# Patient Record
Sex: Female | Born: 1990 | Race: White | Hispanic: No | Marital: Married | Smoking: Never smoker
Health system: Southern US, Community
[De-identification: ages and names within clinical notes are randomized; demographics above are authoritative.]

## PROBLEM LIST (undated history)

## (undated) ENCOUNTER — Inpatient Hospital Stay (HOSPITAL_COMMUNITY): Payer: Self-pay

## (undated) DIAGNOSIS — R87619 Unspecified abnormal cytological findings in specimens from cervix uteri: Secondary | ICD-10-CM

## (undated) DIAGNOSIS — R87629 Unspecified abnormal cytological findings in specimens from vagina: Secondary | ICD-10-CM

## (undated) DIAGNOSIS — Z9889 Other specified postprocedural states: Secondary | ICD-10-CM

## (undated) DIAGNOSIS — R112 Nausea with vomiting, unspecified: Secondary | ICD-10-CM

## (undated) DIAGNOSIS — IMO0002 Reserved for concepts with insufficient information to code with codable children: Secondary | ICD-10-CM

## (undated) DIAGNOSIS — T7840XA Allergy, unspecified, initial encounter: Secondary | ICD-10-CM

## (undated) DIAGNOSIS — R51 Headache: Secondary | ICD-10-CM

## (undated) DIAGNOSIS — F419 Anxiety disorder, unspecified: Secondary | ICD-10-CM

## (undated) HISTORY — DX: Nausea with vomiting, unspecified: R11.2

## (undated) HISTORY — PX: COLPOSCOPY: SHX161

## (undated) HISTORY — DX: Unspecified abnormal cytological findings in specimens from vagina: R87.629

## (undated) HISTORY — DX: Anxiety disorder, unspecified: F41.9

## (undated) HISTORY — DX: Allergy, unspecified, initial encounter: T78.40XA

## (undated) HISTORY — PX: TONSILLECTOMY: SUR1361

## (undated) HISTORY — PX: WISDOM TOOTH EXTRACTION: SHX21

## (undated) HISTORY — DX: Other specified postprocedural states: Z98.890

---
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
## 2008-10-27 ENCOUNTER — Emergency Department (HOSPITAL_COMMUNITY): Admission: EM | Admit: 2008-10-27 | Discharge: 2008-10-27 | Payer: Self-pay | Admitting: Emergency Medicine

## 2009-09-12 ENCOUNTER — Ambulatory Visit: Payer: Self-pay | Admitting: Gynecology

## 2009-09-12 ENCOUNTER — Inpatient Hospital Stay (HOSPITAL_COMMUNITY): Admission: AD | Admit: 2009-09-12 | Discharge: 2009-09-12 | Payer: Self-pay | Admitting: Obstetrics and Gynecology

## 2010-04-17 ENCOUNTER — Encounter: Payer: Self-pay | Admitting: Rheumatology

## 2010-06-18 LAB — CBC
HCT: 37.1 % (ref 36.0–46.0)
Hemoglobin: 12.8 g/dL (ref 12.0–15.0)
MCHC: 34.5 g/dL (ref 30.0–36.0)
MCV: 91.7 fL (ref 78.0–100.0)
Platelets: 239 10*3/uL (ref 150–400)
RBC: 4.05 MIL/uL (ref 3.87–5.11)
RDW: 13.2 % (ref 11.5–15.5)
WBC: 11.6 10*3/uL — ABNORMAL HIGH (ref 4.0–10.5)

## 2010-06-18 LAB — ABO/RH: ABO/RH(D): O POS

## 2010-06-18 LAB — HCG, QUANTITATIVE, PREGNANCY: hCG, Beta Chain, Quant, S: 17833 m[IU]/mL — ABNORMAL HIGH (ref <5)

## 2010-07-08 LAB — URINALYSIS, ROUTINE W REFLEX MICROSCOPIC
Bilirubin Urine: NEGATIVE
Glucose, UA: NEGATIVE mg/dL
Hgb urine dipstick: NEGATIVE
Ketones, ur: NEGATIVE mg/dL
Nitrite: NEGATIVE
Protein, ur: NEGATIVE mg/dL
Specific Gravity, Urine: 1.023 (ref 1.005–1.030)
Urobilinogen, UA: 1 mg/dL (ref 0.0–1.0)
pH: 6 (ref 5.0–8.0)

## 2010-07-08 LAB — DIFFERENTIAL
Basophils Absolute: 0 10*3/uL (ref 0.0–0.1)
Basophils Relative: 1 % (ref 0–1)
Eosinophils Absolute: 0.1 10*3/uL (ref 0.0–0.7)
Eosinophils Relative: 2 % (ref 0–5)
Lymphocytes Relative: 32 % (ref 12–46)
Lymphs Abs: 2.9 10*3/uL (ref 0.7–4.0)
Monocytes Absolute: 1 10*3/uL (ref 0.1–1.0)
Monocytes Relative: 11 % (ref 3–12)
Neutro Abs: 4.9 10*3/uL (ref 1.7–7.7)
Neutrophils Relative %: 55 % (ref 43–77)

## 2010-07-08 LAB — CBC
HCT: 37.4 % (ref 36.0–46.0)
Hemoglobin: 12.8 g/dL (ref 12.0–15.0)
MCHC: 34.2 g/dL (ref 30.0–36.0)
MCV: 89.4 fL (ref 78.0–100.0)
Platelets: 244 10*3/uL (ref 150–400)
RBC: 4.18 MIL/uL (ref 3.87–5.11)
RDW: 12.4 % (ref 11.5–15.5)
WBC: 9 10*3/uL (ref 4.0–10.5)

## 2010-07-08 LAB — POCT PREGNANCY, URINE: Preg Test, Ur: NEGATIVE

## 2010-07-08 LAB — D-DIMER, QUANTITATIVE: D-Dimer, Quant: 0.29 ug/mL-FEU (ref 0.00–0.48)

## 2010-10-04 ENCOUNTER — Emergency Department (HOSPITAL_COMMUNITY)
Admission: EM | Admit: 2010-10-04 | Discharge: 2010-10-05 | Disposition: A | Discharge status: Home / Self Care | Payer: 59 | Attending: Emergency Medicine | Admitting: Emergency Medicine

## 2010-10-04 DIAGNOSIS — R51 Headache: Secondary | ICD-10-CM | POA: Insufficient documentation

## 2010-10-04 DIAGNOSIS — B9789 Other viral agents as the cause of diseases classified elsewhere: Secondary | ICD-10-CM | POA: Insufficient documentation

## 2010-10-04 DIAGNOSIS — R509 Fever, unspecified: Secondary | ICD-10-CM | POA: Insufficient documentation

## 2010-10-05 ENCOUNTER — Emergency Department (HOSPITAL_COMMUNITY): Payer: 59

## 2010-10-05 ENCOUNTER — Encounter (HOSPITAL_COMMUNITY): Payer: Self-pay

## 2010-10-05 LAB — URINALYSIS, ROUTINE W REFLEX MICROSCOPIC
Glucose, UA: NEGATIVE mg/dL
Ketones, ur: 40 mg/dL — AB
Leukocytes, UA: NEGATIVE
Nitrite: NEGATIVE
Protein, ur: 30 mg/dL — AB
Specific Gravity, Urine: 1.028 (ref 1.005–1.030)
Urobilinogen, UA: 1 mg/dL (ref 0.0–1.0)
pH: 6 (ref 5.0–8.0)

## 2010-10-05 LAB — COMPREHENSIVE METABOLIC PANEL
ALT: 10 U/L (ref 0–35)
AST: 13 U/L (ref 0–37)
Albumin: 3.8 g/dL (ref 3.5–5.2)
Alkaline Phosphatase: 99 U/L (ref 39–117)
BUN: 9 mg/dL (ref 6–23)
CO2: 24 mEq/L (ref 19–32)
Calcium: 8.5 mg/dL (ref 8.4–10.5)
Chloride: 102 mEq/L (ref 96–112)
Creatinine, Ser: 0.49 mg/dL — ABNORMAL LOW (ref 0.50–1.10)
GFR calc Af Amer: >60 mL/min (ref >60)
GFR calc non Af Amer: >60 mL/min (ref >60)
Glucose, Bld: 99 mg/dL (ref 70–99)
Potassium: 3.1 mEq/L — ABNORMAL LOW (ref 3.5–5.1)
Sodium: 137 mEq/L (ref 135–145)
Total Bilirubin: 0.4 mg/dL (ref 0.3–1.2)
Total Protein: 8 g/dL (ref 6.0–8.3)

## 2010-10-05 LAB — URINE MICROSCOPIC-ADD ON

## 2010-10-05 LAB — POCT PREGNANCY, URINE: Preg Test, Ur: NEGATIVE

## 2010-10-05 LAB — CBC
HCT: 34.8 % — ABNORMAL LOW (ref 36.0–46.0)
Hemoglobin: 11.9 g/dL — ABNORMAL LOW (ref 12.0–15.0)
MCH: 30.5 pg (ref 26.0–34.0)
MCHC: 34.2 g/dL (ref 30.0–36.0)
MCV: 89.2 fL (ref 78.0–100.0)
Platelets: 207 10*3/uL (ref 150–400)
RBC: 3.9 MIL/uL (ref 3.87–5.11)
RDW: 12.7 % (ref 11.5–15.5)
WBC: 19.3 10*3/uL — ABNORMAL HIGH (ref 4.0–10.5)

## 2010-10-05 LAB — DIFFERENTIAL
Basophils Absolute: 0 10*3/uL (ref 0.0–0.1)
Basophils Relative: 0 % (ref 0–1)
Eosinophils Absolute: 0 10*3/uL (ref 0.0–0.7)
Eosinophils Relative: 0 % (ref 0–5)
Lymphocytes Relative: 14 % (ref 12–46)
Lymphs Abs: 2.6 10*3/uL (ref 0.7–4.0)
Monocytes Absolute: 2.4 10*3/uL — ABNORMAL HIGH (ref 0.1–1.0)
Monocytes Relative: 13 % — ABNORMAL HIGH (ref 3–12)
Neutro Abs: 14.2 10*3/uL — ABNORMAL HIGH (ref 1.7–7.7)
Neutrophils Relative %: 74 % (ref 43–77)

## 2010-10-05 LAB — MONONUCLEOSIS SCREEN: Mono Screen: NEGATIVE

## 2010-10-06 LAB — URINE CULTURE
Colony Count: NO GROWTH
Culture  Setup Time: 201207061150
Culture: NO GROWTH

## 2010-12-31 LAB — OB RESULTS CONSOLE ABO/RH: RH Type: POSITIVE

## 2010-12-31 LAB — OB RESULTS CONSOLE GC/CHLAMYDIA
Chlamydia: NEGATIVE
Gonorrhea: NEGATIVE

## 2010-12-31 LAB — OB RESULTS CONSOLE HEPATITIS B SURFACE ANTIGEN: Hepatitis B Surface Ag: NEGATIVE

## 2010-12-31 LAB — OB RESULTS CONSOLE RPR: RPR: NONREACTIVE

## 2010-12-31 LAB — OB RESULTS CONSOLE ANTIBODY SCREEN: Antibody Screen: NEGATIVE

## 2010-12-31 LAB — OB RESULTS CONSOLE RUBELLA ANTIBODY, IGM: Rubella: IMMUNE

## 2010-12-31 LAB — OB RESULTS CONSOLE HIV ANTIBODY (ROUTINE TESTING): HIV: NONREACTIVE

## 2011-01-22 DIAGNOSIS — M79609 Pain in unspecified limb: Secondary | ICD-10-CM

## 2011-02-10 ENCOUNTER — Encounter (HOSPITAL_COMMUNITY): Payer: Self-pay | Admitting: *Deleted

## 2011-02-10 ENCOUNTER — Inpatient Hospital Stay (HOSPITAL_COMMUNITY)
Admission: RE | Admit: 2011-02-10 | Discharge: 2011-02-10 | Disposition: A | Discharge status: Home / Self Care | Payer: 59 | Source: Ambulatory Visit | Attending: Obstetrics and Gynecology | Admitting: Obstetrics and Gynecology

## 2011-02-10 DIAGNOSIS — M79606 Pain in leg, unspecified: Secondary | ICD-10-CM

## 2011-02-10 DIAGNOSIS — M79609 Pain in unspecified limb: Secondary | ICD-10-CM | POA: Insufficient documentation

## 2011-02-10 DIAGNOSIS — O99891 Other specified diseases and conditions complicating pregnancy: Secondary | ICD-10-CM | POA: Insufficient documentation

## 2011-02-10 HISTORY — DX: Reserved for concepts with insufficient information to code with codable children: IMO0002

## 2011-02-10 HISTORY — DX: Unspecified abnormal cytological findings in specimens from cervix uteri: R87.619

## 2011-02-10 NOTE — ED Provider Notes (Signed)
History   Pt presents today c/o pain in her Rt leg that began earlier today. She states she had the same type pain in her Lt leg last week but now it has moved to the Rt leg. She states the pain starts above her Rt knee and radiates down into her foot. She also reports some "soreness" in her calf. She denies edema, erythema. She denies abd pain, vag dc, bleeding.  Chief Complaint  Patient presents with  . Leg Pain   HPI  OB History    Grav Para Term Preterm Abortions TAB SAB Ect Mult Living   1               History reviewed. No pertinent past medical history.  History reviewed. No pertinent past surgical history.  No family history on file.  History  Substance Use Topics  . Smoking status: Not on file  . Smokeless tobacco: Not on file  . Alcohol Use: Not on file    Allergies: No Known Allergies  Prescriptions prior to admission  Medication Sig Dispense Refill  . albuterol (PROVENTIL,VENTOLIN) 90 MCG/ACT inhaler Inhale 2 puffs into the lungs every 6 (six) hours as needed.        . prenatal vitamin w/FE, FA (PRENATAL 1 + 1) 27-1 MG TABS Take 1 tablet by mouth daily.        . progesterone 200 MG SUPP Place 200 mg vaginally at bedtime.          Review of Systems  Constitutional: Negative for fever.  Cardiovascular: Negative for chest pain.  Gastrointestinal: Negative for nausea, vomiting, abdominal pain, diarrhea and constipation.  Genitourinary: Negative for dysuria, urgency, frequency and hematuria.  Musculoskeletal: Positive for myalgias.  Neurological: Negative for dizziness and headaches.  Psychiatric/Behavioral: Negative for depression and suicidal ideas.   Physical Exam   Blood pressure 110/70, pulse 88, temperature 98.3 F (36.8 C), temperature source Oral, resp. rate 20, height 5\' 7"  (1.702 m), weight 165 lb (74.844 kg), last menstrual period 10/02/2010.  Physical Exam  Nursing note and vitals reviewed. Constitutional: She is oriented to person, place, and  time. She appears well-developed and well-nourished. No distress.  HENT:  Head: Atraumatic.  Eyes: EOM are normal. Pupils are equal, round, and reactive to light.  GI: Soft. She exhibits no distension. There is no tenderness. There is no rebound and no guarding.  Musculoskeletal:       Right knee: She exhibits normal range of motion, no swelling, no ecchymosis, no deformity and no erythema.       Rt leg and lower extremity appears NL. No edema, erythema, or any other abnormality noted. Pt with NL distal pedal pulses. NL popliteal pulses.  Neurological: She is alert and oriented to person, place, and time.  Skin: Skin is warm and dry. She is not diaphoretic.  Psychiatric: She has a normal mood and affect. Her behavior is normal. Judgment and thought content normal.    MAU Course  Procedures    Assessment and Plan  Rt leg/knee pain: discussed with pt at length. Doubt DVT as pt is having pain in anterior portion of lower extremity and there is no erythema or edema. Pt also had exact same pain in other lower extremity last week. However, advised pt to go to [facility] or [facility] ED for further evaluation. She understood and agreed.  Clinton Gallant. Rice III, DrHSc, MPAS, PA-C  02/10/2011, 1:15 AM   Henrietta Hoover, PA 02/10/11 1610

## 2011-02-10 NOTE — Progress Notes (Signed)
Pt states, " My right  knee started hurting at about 6 pm, and the pain radiates all the way to my toes. It hurts to even bend my toes and it feels numb and tingly. My calves hurt. On  Sunday of last week my left leg did the same thing and had achy and sharp pain from 6 pm to midnight. I haven't any injury or fall."

## 2011-02-10 NOTE — ED Notes (Signed)
Henrietta Hoover PA at bedside.

## 2011-05-05 ENCOUNTER — Inpatient Hospital Stay (HOSPITAL_COMMUNITY)
Admission: AD | Admit: 2011-05-05 | Discharge: 2011-05-05 | Disposition: A | Discharge status: Home / Self Care | Payer: 59 | Source: Ambulatory Visit | Attending: Obstetrics and Gynecology | Admitting: Obstetrics and Gynecology

## 2011-05-05 ENCOUNTER — Encounter (HOSPITAL_COMMUNITY): Payer: Self-pay | Admitting: *Deleted

## 2011-05-05 DIAGNOSIS — O368190 Decreased fetal movements, unspecified trimester, not applicable or unspecified: Secondary | ICD-10-CM | POA: Insufficient documentation

## 2011-05-05 NOTE — ED Provider Notes (Signed)
History     Chief Complaint  Patient presents with  . Decreased Fetal Movement   HPI  Kristen Davidson is 21 y.o. G1P0 [redacted]w[redacted]d weeks presenting with decreased fetal movement.  States she has only felt the baby move X 1 today.  States her glucose testing is tomorrow and was afraid to eat much.  Patient is stable.  Happy to hear fetal heart tones.  Denies vaginal bleeding or leaking of fluid.   OB History    Grav Para Term Preterm Abortions TAB SAB Ect Mult Living   1               Past Medical History  Diagnosis Date  . Asthma   . Abnormal Pap smear     Past Surgical History  Procedure Date  . Tonsillectomy     Family History  Problem Relation Age of Onset  . Anesthesia problems Neg Hx   . Hypotension Neg Hx   . Malignant hyperthermia Neg Hx   . Pseudochol deficiency Neg Hx     History  Substance Use Topics  . Smoking status: Never Smoker   . Smokeless tobacco: Never Used  . Alcohol Use: No    Allergies: No Known Allergies  Prescriptions prior to admission  Medication Sig Dispense Refill  . albuterol (PROVENTIL,VENTOLIN) 90 MCG/ACT inhaler Inhale 2 puffs into the lungs every 6 (six) hours as needed.        . prenatal vitamin w/FE, FA (PRENATAL 1 + 1) 27-1 MG TABS Take 1 tablet by mouth daily.        . progesterone 200 MG SUPP Place 200 mg vaginally at bedtime.          Review of Systems  Constitutional: Negative.   Gastrointestinal: Negative.   Genitourinary:       + for decreased fetal movement   Physical Exam   Blood pressure 118/62, pulse 102, temperature 97.8 F (36.6 C), temperature source Oral, resp. rate 18, height 5\' 7"  (1.702 m), weight 177 lb 12.8 oz (80.65 kg), last menstrual period 10/02/2010.  Physical Exam  Constitutional: She is oriented to person, place, and time. She appears well-developed and well-nourished. No distress.  Neurological: She is alert and oriented to person, place, and time.  Skin: Skin is warm and dry.    FMS reassuring.   10X10 accelerations noted.  Baseline 150 bpm  MAU Course  Procedures  MDM 19:58  Reported MSE to Dr. Henderson Cloud.  Patient is stable.  +FHTs.  Monitor for 30 minutes and if + movement, assure patient and discharge to home.    18:40  NST reassuring.  Ready for discharge Assessment and Plan  A;  Decreased fetal movement        P;  Patient reassured that FMS is reassuring.  Keep scheduled appt.   Kristen Davidson,EVE M 05/05/2011, 5:42 PM   Kristen Holmes, NP 05/05/11 1840

## 2011-05-05 NOTE — Progress Notes (Signed)
Pt presents to MAU with decreased fetal movement. Pt is [redacted]w[redacted]d, G2P0. Pt says she tried multiple different things to get baby to move; no movement since 11:00am

## 2011-05-05 NOTE — Progress Notes (Signed)
Pt reports has only felt the baby move once today at 11:30

## 2011-07-17 LAB — OB RESULTS CONSOLE GBS: GBS: NEGATIVE

## 2011-08-02 ENCOUNTER — Encounter (HOSPITAL_COMMUNITY): Payer: Self-pay | Admitting: *Deleted

## 2011-08-02 ENCOUNTER — Telehealth (HOSPITAL_COMMUNITY): Payer: Self-pay | Admitting: *Deleted

## 2011-08-02 NOTE — Telephone Encounter (Signed)
Preadmission screen  

## 2011-08-06 ENCOUNTER — Inpatient Hospital Stay (HOSPITAL_COMMUNITY)
Admission: AD | Admit: 2011-08-06 | Discharge: 2011-08-06 | Disposition: A | Discharge status: Home / Self Care | Payer: 59 | Source: Ambulatory Visit | Attending: Obstetrics and Gynecology | Admitting: Obstetrics and Gynecology

## 2011-08-06 DIAGNOSIS — O368190 Decreased fetal movements, unspecified trimester, not applicable or unspecified: Secondary | ICD-10-CM

## 2011-08-06 DIAGNOSIS — R109 Unspecified abdominal pain: Secondary | ICD-10-CM | POA: Insufficient documentation

## 2011-08-06 DIAGNOSIS — O479 False labor, unspecified: Secondary | ICD-10-CM | POA: Insufficient documentation

## 2011-08-06 NOTE — Discharge Summary (Signed)
  Ms Kristen Davidson is a 20yo G2P0010 at 39.4wks who presented for eval of decreased fetal movement and mild cramping. Denies leak or bldg.  Has felt FM since arrival. Her preg has been followed by the [facility] service and has been essentially unremarkable per her report.  Family History  Problem Relation Age of Onset  . Anesthesia problems Neg Hx   . Hypotension Neg Hx   . Malignant hyperthermia Neg Hx   . Pseudochol deficiency Neg Hx   . Hypertension Mother   . Heart disease Mother     PVCs  . Anemia Mother   . Thyroid disease Mother   . Migraines Mother   . Lupus Mother   . Rheum arthritis Mother   . Hypertension Father   . Diabetes Maternal Aunt   . Thyroid disease Maternal Grandmother   . Heart disease Maternal Grandfather   . Diabetes Maternal Grandfather   . Anxiety disorder Maternal Grandfather   . Cancer Paternal Grandmother     lymphoma  . Cancer Paternal Grandfather     prostate   Past Medical History  Diagnosis Date  . Asthma   . Abnormal Pap smear    Social History:  reports that she has never smoked. She has never used smokeless tobacco. She reports that she does not drink alcohol or use illicit drugs.  Allergies: No Known Allergies  No prescriptions prior to admission      Filed Vitals:   08/06/11 1915 08/06/11 2004 08/06/11 2013  BP: 121/66 113/75 121/72  Pulse: 87 88 82  Temp: 98.4 F (36.9 C)    TempSrc: Oral    Resp: 18  18   FHR reactive and reassuring, +accels, no decels irreg mild ctx Cx FT/thick per RN  IUP at term Decreased FM Abd cramping  D/C home with fetal kick counts and labor precautions Keep sched appt 5/8 or return sooner prn  Cope Marte 08/06/2011, 8:29 PM

## 2011-08-06 NOTE — Discharge Instructions (Signed)
Fetal Movement Counts Patient Name: __________________________________________________ Patient Due Date: ____________________ Kick counts is highly recommended in high risk pregnancies, but it is a good idea for every pregnant woman to do. Start counting fetal movements at 28 weeks of the pregnancy. Fetal movements increase after eating a full meal or eating or drinking something sweet (the blood sugar is higher). It is also important to drink plenty of fluids (well hydrated) before doing the count. Lie on your left side because it helps with the circulation or you can sit in a comfortable chair with your arms over your belly (abdomen) with no distractions around you. DOING THE COUNT  Try to do the count the same time of day each time you do it.   Mark the day and time, then see how long it takes for you to feel 10 movements (kicks, flutters, swishes, rolls). You should have at least 10 movements within 2 hours. You will most likely feel 10 movements in much less than 2 hours. If you do not, wait an hour and count again. After a couple of days you will see a pattern.   What you are looking for is a change in the pattern or not enough counts in 2 hours. Is it taking longer in time to reach 10 movements?  SEEK MEDICAL CARE IF:  You feel less than 10 counts in 2 hours. Tried twice.   No movement in one hour.   The pattern is changing or taking longer each day to reach 10 counts in 2 hours.   You feel the baby is not moving as it usually does.  Date: ____________ Movements: ____________ Start time: ____________ Finish time: ____________  Date: ____________ Movements: ____________ Start time: ____________ Finish time: ____________ Date: ____________ Movements: ____________ Start time: ____________ Finish time: ____________ Date: ____________ Movements: ____________ Start time: ____________ Finish time: ____________ Date: ____________ Movements: ____________ Start time: ____________ Finish time:  ____________ Date: ____________ Movements: ____________ Start time: ____________ Finish time: ____________ Date: ____________ Movements: ____________ Start time: ____________ Finish time: ____________ Date: ____________ Movements: ____________ Start time: ____________ Finish time: ____________  Date: ____________ Movements: ____________ Start time: ____________ Finish time: ____________ Date: ____________ Movements: ____________ Start time: ____________ Finish time: ____________ Date: ____________ Movements: ____________ Start time: ____________ Finish time: ____________ Date: ____________ Movements: ____________ Start time: ____________ Finish time: ____________ Date: ____________ Movements: ____________ Start time: ____________ Finish time: ____________ Date: ____________ Movements: ____________ Start time: ____________ Finish time: ____________ Date: ____________ Movements: ____________ Start time: ____________ Finish time: ____________  Date: ____________ Movements: ____________ Start time: ____________ Finish time: ____________ Date: ____________ Movements: ____________ Start time: ____________ Finish time: ____________ Date: ____________ Movements: ____________ Start time: ____________ Finish time: ____________ Date: ____________ Movements: ____________ Start time: ____________ Finish time: ____________ Date: ____________ Movements: ____________ Start time: ____________ Finish time: ____________ Date: ____________ Movements: ____________ Start time: ____________ Finish time: ____________ Date: ____________ Movements: ____________ Start time: ____________ Finish time: ____________  Date: ____________ Movements: ____________ Start time: ____________ Finish time: ____________ Date: ____________ Movements: ____________ Start time: ____________ Finish time: ____________ Date: ____________ Movements: ____________ Start time: ____________ Finish time: ____________ Date: ____________ Movements:  ____________ Start time: ____________ Finish time: ____________ Date: ____________ Movements: ____________ Start time: ____________ Finish time: ____________ Date: ____________ Movements: ____________ Start time: ____________ Finish time: ____________ Date: ____________ Movements: ____________ Start time: ____________ Finish time: ____________  Date: ____________ Movements: ____________ Start time: ____________ Finish time: ____________ Date: ____________ Movements: ____________ Start time: ____________ Finish time: ____________ Date: ____________ Movements: ____________ Start time:   ____________ Finish time: ____________ Date: ____________ Movements: ____________ Start time: ____________ Finish time: ____________ Date: ____________ Movements: ____________ Start time: ____________ Finish time: ____________ Date: ____________ Movements: ____________ Start time: ____________ Finish time: ____________ Date: ____________ Movements: ____________ Start time: ____________ Finish time: ____________  Date: ____________ Movements: ____________ Start time: ____________ Finish time: ____________ Date: ____________ Movements: ____________ Start time: ____________ Finish time: ____________ Date: ____________ Movements: ____________ Start time: ____________ Finish time: ____________ Date: ____________ Movements: ____________ Start time: ____________ Finish time: ____________ Date: ____________ Movements: ____________ Start time: ____________ Finish time: ____________ Date: ____________ Movements: ____________ Start time: ____________ Finish time: ____________ Date: ____________ Movements: ____________ Start time: ____________ Finish time: ____________  Date: ____________ Movements: ____________ Start time: ____________ Finish time: ____________ Date: ____________ Movements: ____________ Start time: ____________ Finish time: ____________ Date: ____________ Movements: ____________ Start time: ____________ Finish  time: ____________ Date: ____________ Movements: ____________ Start time: ____________ Finish time: ____________ Date: ____________ Movements: ____________ Start time: ____________ Finish time: ____________ Date: ____________ Movements: ____________ Start time: ____________ Finish time: ____________ Date: ____________ Movements: ____________ Start time: ____________ Finish time: ____________  Date: ____________ Movements: ____________ Start time: ____________ Finish time: ____________ Date: ____________ Movements: ____________ Start time: ____________ Finish time: ____________ Date: ____________ Movements: ____________ Start time: ____________ Finish time: ____________ Date: ____________ Movements: ____________ Start time: ____________ Finish time: ____________ Date: ____________ Movements: ____________ Start time: ____________ Finish time: ____________ Date: ____________ Movements: ____________ Start time: ____________ Finish time: ____________ Document Released: 04/17/2006 Document Revised: 03/07/2011 Document Reviewed: 10/18/2008 [employer] Patient Information 2012 [employer]. 

## 2011-08-07 ENCOUNTER — Encounter (HOSPITAL_COMMUNITY): Payer: Self-pay | Admitting: *Deleted

## 2011-08-07 ENCOUNTER — Inpatient Hospital Stay (HOSPITAL_COMMUNITY)
Admission: AD | Admit: 2011-08-07 | Discharge: 2011-08-10 | DRG: 775 | Disposition: A | Discharge status: Home / Self Care | Payer: 59 | Source: Ambulatory Visit | Attending: Obstetrics and Gynecology | Admitting: Obstetrics and Gynecology

## 2011-08-07 DIAGNOSIS — O365990 Maternal care for other known or suspected poor fetal growth, unspecified trimester, not applicable or unspecified: Secondary | ICD-10-CM | POA: Diagnosis present

## 2011-08-07 DIAGNOSIS — O4100X0 Oligohydramnios, unspecified trimester, not applicable or unspecified: Principal | ICD-10-CM | POA: Diagnosis present

## 2011-08-07 LAB — CBC
HCT: 36.2 % (ref 36.0–46.0)
Hemoglobin: 12.3 g/dL (ref 12.0–15.0)
MCH: 31 pg (ref 26.0–34.0)
MCHC: 34 g/dL (ref 30.0–36.0)
MCV: 91.2 fL (ref 78.0–100.0)
Platelets: 245 10*3/uL (ref 150–400)
RBC: 3.97 MIL/uL (ref 3.87–5.11)
RDW: 12.7 % (ref 11.5–15.5)
WBC: 12.4 10*3/uL — ABNORMAL HIGH (ref 4.0–10.5)

## 2011-08-07 MED ORDER — LIDOCAINE HCL (PF) 1 % IJ SOLN
30.0000 mL | INTRAMUSCULAR | Status: DC | PRN
  Filled 2011-08-07: qty 30

## 2011-08-07 MED ORDER — MISOPROSTOL 25 MCG QUARTER TABLET
25.0000 ug | ORAL_TABLET | ORAL | Status: DC | PRN
  Administered 2011-08-08 (×2): 25 ug via VAGINAL
  Filled 2011-08-07 (×2): qty 0.25

## 2011-08-07 MED ORDER — BUTORPHANOL TARTRATE 2 MG/ML IJ SOLN: 1.0000 mg | INTRAMUSCULAR | Status: DC | PRN

## 2011-08-07 MED ORDER — CITRIC ACID-SODIUM CITRATE 334-500 MG/5ML PO SOLN: 30.0000 mL | ORAL | Status: DC | PRN

## 2011-08-07 MED ORDER — OXYTOCIN 20 UNITS IN LACTATED RINGERS INFUSION - SIMPLE
1.0000 m[IU]/min | INTRAVENOUS | Status: DC
  Administered 2011-08-07: 1 m[IU]/min via INTRAVENOUS
  Administered 2011-08-07: 6 m[IU]/min via INTRAVENOUS
  Filled 2011-08-07: qty 1000

## 2011-08-07 MED ORDER — TERBUTALINE SULFATE 1 MG/ML IJ SOLN: 0.2500 mg | Freq: Once | INTRAMUSCULAR | Status: AC | PRN

## 2011-08-07 MED ORDER — FLEET ENEMA 7-19 GM/118ML RE ENEM: 1.0000 | ENEMA | RECTAL | Status: DC | PRN

## 2011-08-07 MED ORDER — LACTATED RINGERS IV SOLN: 500.0000 mL | INTRAVENOUS | Status: DC | PRN

## 2011-08-07 MED ORDER — IBUPROFEN 600 MG PO TABS: 600.0000 mg | ORAL_TABLET | Freq: Four times a day (QID) | ORAL | Status: DC | PRN

## 2011-08-07 MED ORDER — ACETAMINOPHEN 325 MG PO TABS: 650.0000 mg | ORAL_TABLET | ORAL | Status: DC | PRN

## 2011-08-07 MED ORDER — OXYTOCIN BOLUS FROM INFUSION
500.0000 mL | Freq: Once | INTRAVENOUS | Status: DC
  Filled 2011-08-07: qty 1000
  Filled 2011-08-07: qty 500

## 2011-08-07 MED ORDER — ONDANSETRON HCL 4 MG/2ML IJ SOLN: 4.0000 mg | Freq: Four times a day (QID) | INTRAMUSCULAR | Status: DC | PRN

## 2011-08-07 MED ORDER — LACTATED RINGERS IV SOLN
INTRAVENOUS | Status: DC
  Administered 2011-08-07: 13:00:00 via INTRAVENOUS

## 2011-08-07 MED ORDER — OXYTOCIN 20 UNITS IN LACTATED RINGERS INFUSION - SIMPLE: 125.0000 mL/h | Freq: Once | INTRAVENOUS | Status: DC

## 2011-08-07 MED ORDER — OXYCODONE-ACETAMINOPHEN 5-325 MG PO TABS
1.0000 | ORAL_TABLET | ORAL | Status: DC | PRN
Start: 2011-08-07 — End: 2011-08-08

## 2011-08-07 NOTE — H&P (Signed)
Kristen Davidson is a 21 y.o. female presenting for induction of labor secondary to oligohydramnios.  U/S today in office C/W AFI  3%, two pockets about 2 cm, some less. EFW  6#2oz=6%. No SROM, no CNS change or epigastric pain. Maternal Medical History:  Fetal activity: Perceived fetal activity is normal.      OB History    Grav Para Term Preterm Abortions TAB SAB Ect Mult Living   2 0 0 0 1 0 1 0 0 0      Past Medical History  Diagnosis Date  . Asthma   . Abnormal Pap smear    Past Surgical History  Procedure Date  . Tonsillectomy   . Wisdom tooth extraction   . Colposcopy    Family History: family history includes Anemia in her mother; Anxiety disorder in her maternal grandfather; Cancer in her paternal grandfather and paternal grandmother; Diabetes in her maternal aunt and maternal grandfather; Heart disease in her maternal grandfather and mother; Hypertension in her father and mother; Lupus in her mother; Migraines in her mother; Rheum arthritis in her mother; and Thyroid disease in her maternal grandmother and mother.  There is no history of Anesthesia problems, and Hypotension, and Malignant hyperthermia, and Pseudochol deficiency, . Social History:  reports that she has never smoked. She has never used smokeless tobacco. She reports that she does not drink alcohol or use illicit drugs.  Review of Systems  Eyes: Negative for blurred vision.  Gastrointestinal: Negative for abdominal pain.  Neurological: Negative for headaches.    Dilation: 1.5 Effacement (%): 80 Station: -2 Exam by:: Dr. Henderson Cloud Blood pressure 129/72, pulse 106, temperature 97.7 F (36.5 C), temperature source Oral, resp. rate 20, height 5\' 7"  (1.702 m), weight 89.359 kg (197 lb), last menstrual period 10/02/2010. Maternal Exam:  Abdomen: Fetal presentation: vertex     Fetal Exam Fetal Monitor Review: Pattern: accelerations present.       Physical Exam  Cardiovascular: Normal rate and regular  rhythm.   Respiratory: Effort normal and breath sounds normal.  GI: There is no tenderness.  Neurological: She has normal reflexes.    Prenatal labs: ABO, Rh: O/Positive/-- (10/01 0000) Antibody: Negative (10/01 0000) Rubella: Immune (10/01 0000) RPR: Nonreactive (10/01 0000)  HBsAg: Negative (10/01 0000)  HIV: Non-reactive (10/01 0000)  GBS:     Assessment/Plan: 21 yo G2P0 at 81 5/7 weeks with oligohydramnios and SGA for induction of labor.  Will begin low dose pitocin. If no response will use cytotec tonight.  D/W pt risks. All questions answered.   Lark Runk II,Zaharah Amir E 08/07/2011, 1:48 PM

## 2011-08-07 NOTE — Progress Notes (Signed)
On 6 mu of pitocin FHT reactive  UCs mild/irregular Patient very comfortable  D/W patient options Will stop pitocin Begin cytotec tonight

## 2011-08-08 ENCOUNTER — Encounter (HOSPITAL_COMMUNITY): Payer: Self-pay | Admitting: Anesthesiology

## 2011-08-08 ENCOUNTER — Encounter (HOSPITAL_COMMUNITY): Payer: Self-pay | Admitting: *Deleted

## 2011-08-08 ENCOUNTER — Inpatient Hospital Stay (HOSPITAL_COMMUNITY): Payer: 59 | Admitting: Anesthesiology

## 2011-08-08 MED ORDER — LACTATED RINGERS IV SOLN
INTRAVENOUS | Status: DC
  Administered 2011-08-08 (×2): via INTRAVENOUS

## 2011-08-08 MED ORDER — TERBUTALINE SULFATE 1 MG/ML IJ SOLN: 0.2500 mg | Freq: Once | INTRAMUSCULAR | Status: DC | PRN

## 2011-08-08 MED ORDER — MEASLES, MUMPS & RUBELLA VAC ~~LOC~~ INJ: 0.5000 mL | INJECTION | Freq: Once | SUBCUTANEOUS | Status: DC

## 2011-08-08 MED ORDER — FENTANYL 2.5 MCG/ML BUPIVACAINE 1/10 % EPIDURAL INFUSION (WH - ANES)
14.0000 mL/h | INTRAMUSCULAR | Status: DC
  Administered 2011-08-08: 14 mL/h via EPIDURAL
  Filled 2011-08-08 (×2): qty 60

## 2011-08-08 MED ORDER — PHENYLEPHRINE 40 MCG/ML (10ML) SYRINGE FOR IV PUSH (FOR BLOOD PRESSURE SUPPORT)
80.0000 ug | PREFILLED_SYRINGE | INTRAVENOUS | Status: DC | PRN
  Filled 2011-08-08: qty 5

## 2011-08-08 MED ORDER — MEDROXYPROGESTERONE ACETATE 150 MG/ML IM SUSP: 150.0000 mg | INTRAMUSCULAR | Status: DC | PRN

## 2011-08-08 MED ORDER — DIBUCAINE 1 % RE OINT
1.0000 "application " | TOPICAL_OINTMENT | RECTAL | Status: DC | PRN
  Administered 2011-08-09: 1 via RECTAL

## 2011-08-08 MED ORDER — EPHEDRINE 5 MG/ML INJ
10.0000 mg | INTRAVENOUS | Status: DC | PRN
  Filled 2011-08-08: qty 4

## 2011-08-08 MED ORDER — EPHEDRINE 5 MG/ML INJ: 10.0000 mg | INTRAVENOUS | Status: DC | PRN

## 2011-08-08 MED ORDER — DIPHENHYDRAMINE HCL 25 MG PO CAPS: 25.0000 mg | ORAL_CAPSULE | Freq: Four times a day (QID) | ORAL | Status: DC | PRN

## 2011-08-08 MED ORDER — PRENATAL MULTIVITAMIN CH
1.0000 | ORAL_TABLET | Freq: Every day | ORAL | Status: DC
  Administered 2011-08-10: 1 via ORAL
  Filled 2011-08-08 (×2): qty 1

## 2011-08-08 MED ORDER — FENTANYL 2.5 MCG/ML BUPIVACAINE 1/10 % EPIDURAL INFUSION (WH - ANES)
INTRAMUSCULAR | Status: DC | PRN
  Administered 2011-08-08: 14 mL/h via EPIDURAL

## 2011-08-08 MED ORDER — BENZOCAINE-MENTHOL 20-0.5 % EX AERO
1.0000 "application " | INHALATION_SPRAY | CUTANEOUS | Status: DC | PRN
  Administered 2011-08-09 – 2011-08-10 (×2): 1 via TOPICAL
  Filled 2011-08-08 (×3): qty 56

## 2011-08-08 MED ORDER — OXYTOCIN 20 UNITS IN LACTATED RINGERS INFUSION - SIMPLE
1.0000 m[IU]/min | INTRAVENOUS | Status: DC
  Administered 2011-08-08: 2 m[IU]/min via INTRAVENOUS

## 2011-08-08 MED ORDER — LACTATED RINGERS IV SOLN
500.0000 mL | Freq: Once | INTRAVENOUS | Status: AC
  Administered 2011-08-08: 500 mL via INTRAVENOUS

## 2011-08-08 MED ORDER — SODIUM BICARBONATE 8.4 % IV SOLN
INTRAVENOUS | Status: DC | PRN
  Administered 2011-08-08: 4 mL via EPIDURAL

## 2011-08-08 MED ORDER — OXYCODONE-ACETAMINOPHEN 5-325 MG PO TABS
1.0000 | ORAL_TABLET | ORAL | Status: DC | PRN
  Administered 2011-08-10: 1 via ORAL
  Filled 2011-08-08 (×2): qty 1

## 2011-08-08 MED ORDER — SIMETHICONE 80 MG PO CHEW: 80.0000 mg | CHEWABLE_TABLET | ORAL | Status: DC | PRN

## 2011-08-08 MED ORDER — ONDANSETRON HCL 4 MG PO TABS: 4.0000 mg | ORAL_TABLET | ORAL | Status: DC | PRN

## 2011-08-08 MED ORDER — LANOLIN HYDROUS EX OINT: TOPICAL_OINTMENT | CUTANEOUS | Status: DC | PRN

## 2011-08-08 MED ORDER — SENNOSIDES-DOCUSATE SODIUM 8.6-50 MG PO TABS
2.0000 | ORAL_TABLET | Freq: Every day | ORAL | Status: DC
  Administered 2011-08-08 – 2011-08-09 (×2): 2 via ORAL

## 2011-08-08 MED ORDER — WITCH HAZEL-GLYCERIN EX PADS: 1.0000 "application " | MEDICATED_PAD | CUTANEOUS | Status: DC | PRN

## 2011-08-08 MED ORDER — TETANUS-DIPHTH-ACELL PERTUSSIS 5-2.5-18.5 LF-MCG/0.5 IM SUSP: 0.5000 mL | Freq: Once | INTRAMUSCULAR | Status: DC

## 2011-08-08 MED ORDER — PHENYLEPHRINE 40 MCG/ML (10ML) SYRINGE FOR IV PUSH (FOR BLOOD PRESSURE SUPPORT): 80.0000 ug | PREFILLED_SYRINGE | INTRAVENOUS | Status: DC | PRN

## 2011-08-08 MED ORDER — DIPHENHYDRAMINE HCL 50 MG/ML IJ SOLN
12.5000 mg | INTRAMUSCULAR | Status: DC | PRN
  Administered 2011-08-08: 12.5 mg via INTRAVENOUS
  Filled 2011-08-08: qty 1

## 2011-08-08 MED ORDER — ONDANSETRON HCL 4 MG/2ML IJ SOLN: 4.0000 mg | INTRAMUSCULAR | Status: DC | PRN

## 2011-08-08 MED ORDER — IBUPROFEN 600 MG PO TABS
600.0000 mg | ORAL_TABLET | Freq: Four times a day (QID) | ORAL | Status: DC
  Administered 2011-08-08 – 2011-08-10 (×7): 600 mg via ORAL
  Filled 2011-08-08 (×7): qty 1

## 2011-08-08 NOTE — Progress Notes (Signed)
Pt pushing x .  Good effort.  FHT reassuring toco Q2  Cvx c/c/+2 station  A/P:  Exp mngt

## 2011-08-08 NOTE — Progress Notes (Signed)
Pt comfortable w/ epidural  FHT reassuring Toco Q 1-2 Cvx 8/C/-1  A/P:  Exp mngt

## 2011-08-08 NOTE — Anesthesia Preprocedure Evaluation (Addendum)
Anesthesia Evaluation  Patient identified by MRN, date of birth, ID band Patient awake    Reviewed: Allergy & Precautions, H&P , Patient's Chart, lab work & pertinent test results  Airway Mallampati: II TM Distance: >3 FB Neck ROM: full    Dental  (+) Teeth Intact   Pulmonary asthma (rare inhaler) ,  breath sounds clear to auscultation        Cardiovascular Rhythm:regular Rate:Normal     Neuro/Psych    GI/Hepatic   Endo/Other    Renal/GU      Musculoskeletal   Abdominal   Peds  Hematology   Anesthesia Other Findings       Reproductive/Obstetrics (+) Pregnancy                          Anesthesia Physical Anesthesia Plan  ASA: II  Anesthesia Plan: Epidural   Post-op Pain Management:    Induction:   Airway Management Planned:   Additional Equipment:   Intra-op Plan:   Post-operative Plan:   Informed Consent: I have reviewed the patients History and Physical, chart, labs and discussed the procedure including the risks, benefits and alternatives for the proposed anesthesia with the patient or authorized representative who has indicated his/her understanding and acceptance.   Dental Advisory Given  Plan Discussed with:   Anesthesia Plan Comments: (Labs checked- platelets confirmed with RN in room. Fetal heart tracing, per RN, reported to be stable enough for sitting procedure. Discussed epidural, and patient consents to the procedure:  included risk of possible headache,backache, failed block, allergic reaction, and nerve injury. This patient was asked if she had any questions or concerns before the procedure started. )        Anesthesia Quick Evaluation

## 2011-08-08 NOTE — Progress Notes (Signed)
Pt feels some cramping w/ ctx.  No vb or lof.  + FM  FHT  reassuring Toco  Q5 Cvx 2/80/-3 AROM - clear  A/P:  Start pitocin Exp mngt

## 2011-08-08 NOTE — Progress Notes (Signed)
Pt pushing almost 2hrs, getting tired  FHT reasuring w/ early decels Toco Q1-2 + 2 station  A/P:  Will continue pitocin, continue vacuum if no delivery 20-10min.

## 2011-08-08 NOTE — Progress Notes (Signed)
SVD of vigerous female infant w/ apgars of 8,9.  Placenta delivered spontaneous w/ 3VC.   2nd degree lac repaired w/ 3-0 vicryl rapide.  Fundus firm.  EBL 400cc .   

## 2011-08-08 NOTE — Anesthesia Procedure Notes (Signed)

## 2011-08-09 LAB — CBC
HCT: 34.1 % — ABNORMAL LOW (ref 36.0–46.0)
Hemoglobin: 11.2 g/dL — ABNORMAL LOW (ref 12.0–15.0)
MCH: 30.6 pg (ref 26.0–34.0)
MCHC: 32.8 g/dL (ref 30.0–36.0)
MCV: 93.2 fL (ref 78.0–100.0)
Platelets: 245 10*3/uL (ref 150–400)
RBC: 3.66 MIL/uL — ABNORMAL LOW (ref 3.87–5.11)
RDW: 13.1 % (ref 11.5–15.5)
WBC: 17.9 10*3/uL — ABNORMAL HIGH (ref 4.0–10.5)

## 2011-08-09 LAB — RPR: RPR Ser Ql: NONREACTIVE

## 2011-08-09 NOTE — Progress Notes (Signed)
Post Partum Day 1 Subjective: no complaints  Objective: Blood pressure 102/64, pulse 84, temperature 98.6 F (37 C), temperature source Oral, resp. rate 18, height 5\' 7"  (1.702 m), weight 89.359 kg (197 lb), last menstrual period 10/02/2010, SpO2 98.00%, unknown if currently breastfeeding.  Physical Exam:  General: alert, cooperative and appears stated age Lochia: appropriate Uterine Fundus: firm Incision: healing well DVT Evaluation: No evidence of DVT seen on physical exam.   Basename 08/09/11 0540 08/07/11 1335  HGB 11.2* 12.3  HCT 34.1* 36.2    Assessment/Plan: Plan for discharge tomorrow   LOS: 2 days   Halston Kintz L 08/09/2011, 7:30 AM

## 2011-08-09 NOTE — Anesthesia Postprocedure Evaluation (Signed)
Anesthesia Post Note  Patient: Kristen Davidson  Procedure(s) Performed: * No procedures listed *  Anesthesia type: Epidural  Patient location: Mother/Baby  Post pain: Pain level controlled  Post assessment: Post-op Vital signs reviewed  Last Vitals:  Filed Vitals:   08/09/11 0010  BP: 102/64  Pulse: 84  Temp: 37 C  Resp: 18    Post vital signs: Reviewed  Level of consciousness: awake  Complications: No apparent anesthesia complications

## 2011-08-10 MED ORDER — CYCLOBENZAPRINE HCL 10 MG PO TABS: 10.0000 mg | ORAL_TABLET | Freq: Three times a day (TID) | ORAL | Status: AC | PRN

## 2011-08-10 MED ORDER — CYCLOBENZAPRINE HCL 10 MG PO TABS
10.0000 mg | ORAL_TABLET | Freq: Three times a day (TID) | ORAL | Status: DC | PRN
  Filled 2011-08-10: qty 1

## 2011-08-10 MED ORDER — IBUPROFEN 600 MG PO TABS: 600.0000 mg | ORAL_TABLET | Freq: Four times a day (QID) | ORAL | Status: AC

## 2011-08-10 MED ORDER — OXYCODONE-ACETAMINOPHEN 5-325 MG PO TABS
1.0000 | ORAL_TABLET | ORAL | Status: AC | PRN
Start: 2011-08-10 — End: 2011-08-20

## 2011-08-10 NOTE — Discharge Summary (Signed)
Obstetric Discharge Summary Reason for Admission: induction of labor Prenatal Procedures: none Intrapartum Procedures: spontaneous vaginal delivery Postpartum Procedures: none Complications-Operative and Postpartum: 2nd degree perineal laceration Hemoglobin  Date Value Range Status  08/09/2011 11.2* 12.0-15.0 (g/dL) Final     HCT  Date Value Range Status  08/09/2011 34.1* 36.0-46.0 (%) Final    Physical Exam:  General: alert, cooperative and appears stated age 21: appropriate Uterine Fundus: firm Incision: healing well, no significant drainage, no dehiscence, no significant erythema DVT Evaluation: No evidence of DVT seen on physical exam.  Discharge Diagnoses: Term Pregnancy-delivered  Discharge Information: Date: 08/10/2011 Activity: pelvic rest Diet: routine Medications: Ibuprofen, Percocet and flexeril Condition: improved Instructions: refer to practice specific booklet Discharge to: home   Newborn Data: Live born female  Birth Weight: 6 lb 15.3 oz (3155 g) APGAR: 8, 9  Home with mother.  Kristen Davidson L 08/10/2011, 8:27 AM

## 2011-08-10 NOTE — Progress Notes (Signed)
Post Partum Day 1 Subjective: up ad lib, voiding, tolerating PO and complains of intense pain near coccyx  Objective: Blood pressure 105/70, pulse 80, temperature 98.3 F (36.8 C), temperature source Oral, resp. rate 18, height 5\' 7"  (1.702 m), weight 89.359 kg (197 lb), last menstrual period 10/02/2010, SpO2 98.00%, unknown if currently breastfeeding.  Physical Exam:  General: alert, cooperative and appears stated age Lochia: appropriate Uterine Fundus: firm Incision: healing well DVT Evaluation: No evidence of DVT seen on physical exam. Tender near coccyx   Basename 08/09/11 0540 08/07/11 1335  HGB 11.2* 12.3  HCT 34.1* 36.2    Assessment/Plan: Discharge home and Breastfeeding Possible coccygeal strain/fracture Recommend sitz bath, muscle relaxants and heating pad   LOS: 3 days   Kristen Davidson L 08/10/2011, 8:26 AM

## 2011-08-11 ENCOUNTER — Encounter (HOSPITAL_COMMUNITY): Payer: Self-pay | Admitting: *Deleted

## 2011-08-11 ENCOUNTER — Inpatient Hospital Stay (HOSPITAL_COMMUNITY)
Admission: AD | Admit: 2011-08-11 | Discharge: 2011-08-12 | Disposition: A | Discharge status: Hospice | Payer: 59 | Source: Ambulatory Visit | Attending: Obstetrics and Gynecology | Admitting: Obstetrics and Gynecology

## 2011-08-11 DIAGNOSIS — O8612 Endometritis following delivery: Secondary | ICD-10-CM | POA: Insufficient documentation

## 2011-08-11 DIAGNOSIS — O2390 Unspecified genitourinary tract infection in pregnancy, unspecified trimester: Secondary | ICD-10-CM | POA: Insufficient documentation

## 2011-08-11 DIAGNOSIS — N719 Inflammatory disease of uterus, unspecified: Secondary | ICD-10-CM

## 2011-08-11 DIAGNOSIS — O99893 Other specified diseases and conditions complicating puerperium: Secondary | ICD-10-CM | POA: Insufficient documentation

## 2011-08-11 DIAGNOSIS — R109 Unspecified abdominal pain: Secondary | ICD-10-CM | POA: Insufficient documentation

## 2011-08-11 DIAGNOSIS — K37 Unspecified appendicitis: Secondary | ICD-10-CM | POA: Insufficient documentation

## 2011-08-11 LAB — URINE MICROSCOPIC-ADD ON

## 2011-08-11 LAB — DIFFERENTIAL
Basophils Absolute: 0 10*3/uL (ref 0.0–0.1)
Basophils Relative: 0 % (ref 0–1)
Eosinophils Absolute: 0.2 10*3/uL (ref 0.0–0.7)
Eosinophils Relative: 1 % (ref 0–5)
Lymphocytes Relative: 7 % — ABNORMAL LOW (ref 12–46)
Lymphs Abs: 1.5 10*3/uL (ref 0.7–4.0)
Monocytes Absolute: 1.3 10*3/uL — ABNORMAL HIGH (ref 0.1–1.0)
Monocytes Relative: 6 % (ref 3–12)
Neutro Abs: 18.4 10*3/uL — ABNORMAL HIGH (ref 1.7–7.7)
Neutrophils Relative %: 86 % — ABNORMAL HIGH (ref 43–77)

## 2011-08-11 LAB — CBC
HCT: 34.5 % — ABNORMAL LOW (ref 36.0–46.0)
Hemoglobin: 11.5 g/dL — ABNORMAL LOW (ref 12.0–15.0)
MCH: 31.1 pg (ref 26.0–34.0)
MCHC: 33.3 g/dL (ref 30.0–36.0)
MCV: 93.2 fL (ref 78.0–100.0)
Platelets: 292 10*3/uL (ref 150–400)
RBC: 3.7 MIL/uL — ABNORMAL LOW (ref 3.87–5.11)
RDW: 12.8 % (ref 11.5–15.5)
WBC: 21.4 10*3/uL — ABNORMAL HIGH (ref 4.0–10.5)

## 2011-08-11 LAB — URINALYSIS, ROUTINE W REFLEX MICROSCOPIC
Bilirubin Urine: NEGATIVE
Glucose, UA: NEGATIVE mg/dL
Ketones, ur: NEGATIVE mg/dL
Nitrite: NEGATIVE
Protein, ur: NEGATIVE mg/dL
Specific Gravity, Urine: 1.01 (ref 1.005–1.030)
Urobilinogen, UA: 0.2 mg/dL (ref 0.0–1.0)
pH: 6.5 (ref 5.0–8.0)

## 2011-08-11 MED ORDER — CYCLOBENZAPRINE HCL 10 MG PO TABS
5.0000 mg | ORAL_TABLET | Freq: Once | ORAL | Status: AC
  Administered 2011-08-12: 5 mg via ORAL
  Filled 2011-08-11: qty 1

## 2011-08-11 NOTE — MAU Provider Note (Signed)
History     CSN: [id number]  Arrival date & time 08/11/11  2200   None     Chief Complaint  Patient presents with  . Abdominal Pain    HPI Kristen Davidson is a 21 y.o. female 3 days post delivery who presents to MAU for lower abdominal cramping and pain in her lower back. (Fractured coccyx during delivery.)  She also reports low grade fever today and vomiting x 3. She had one episode of severe pain prior to coming to MAU that lasted about an hour but then got better. The history was provided by the patient.  Past Medical History  Diagnosis Date  . Asthma   . Abnormal Pap smear     Past Surgical History  Procedure Date  . Tonsillectomy   . Wisdom tooth extraction   . Colposcopy     Family History  Problem Relation Age of Onset  . Anesthesia problems Neg Hx   . Hypotension Neg Hx   . Malignant hyperthermia Neg Hx   . Pseudochol deficiency Neg Hx   . Hypertension Mother   . Heart disease Mother     PVCs  . Anemia Mother   . Thyroid disease Mother   . Migraines Mother   . Lupus Mother   . Rheum arthritis Mother   . Hypertension Father   . Diabetes Maternal Aunt   . Thyroid disease Maternal Grandmother   . Heart disease Maternal Grandfather   . Diabetes Maternal Grandfather   . Anxiety disorder Maternal Grandfather   . Cancer Paternal Grandmother     lymphoma  . Cancer Paternal Grandfather     prostate    History  Substance Use Topics  . Smoking status: Never Smoker   . Smokeless tobacco: Never Used  . Alcohol Use: No    OB History    Grav Para Term Preterm Abortions TAB SAB Ect Mult Living   2 1 1  0 1 0 1 0 0 1      Review of Systems  Constitutional: Positive for fever and chills. Negative for diaphoresis and fatigue.  HENT: Negative for ear pain, congestion, sore throat, facial swelling, neck pain, neck stiffness, dental problem and sinus pressure.   Eyes: Negative for photophobia, pain and discharge.  Respiratory: Positive for cough. Negative for  chest tightness and wheezing.   Gastrointestinal: Positive for nausea, vomiting and abdominal pain (cramping). Negative for diarrhea, constipation and abdominal distention.  Genitourinary: Positive for dysuria, frequency and vaginal bleeding. Negative for urgency, flank pain and difficulty urinating.  Musculoskeletal: Positive for back pain. Negative for myalgias and gait problem.  Skin: Negative for color change and rash.  Neurological: Positive for dizziness and headaches. Negative for speech difficulty, weakness, light-headedness and numbness.  Psychiatric/Behavioral: Negative for confusion and agitation. The patient is not nervous/anxious.     Allergies  Review of patient's allergies indicates no known allergies.  Home Medications  No current outpatient prescriptions on file.  BP 120/68  Pulse 117  Temp(Src) 99.5 F (37.5 C) (Oral)  Resp 20  LMP 10/02/2010  Breastfeeding? Yes  Physical Exam  Nursing note and vitals reviewed. Constitutional: She is oriented to person, place, and time. She appears well-developed and well-nourished. No distress.  HENT:  Head: Normocephalic.  Eyes: EOM are normal.  Neck: Normal range of motion. Neck supple.  Cardiovascular:       Tachycardia   Pulmonary/Chest: Effort normal.  Abdominal: Soft. There is tenderness in the right lower quadrant and left lower  quadrant. There is no rigidity, no rebound, no guarding and no CVA tenderness.  Musculoskeletal: Normal range of motion.  Neurological: She is alert and oriented to person, place, and time. No cranial nerve deficit.  Skin: Skin is warm and dry.  Psychiatric: She has a normal mood and affect. Her behavior is normal. Judgment and thought content normal.   Results for orders placed during the hospital encounter of 08/11/11 (from the past 24 hour(s))  URINALYSIS, ROUTINE W REFLEX MICROSCOPIC     Status: Abnormal   Collection Time   08/11/11 11:21 PM      Component Value Range   Color, Urine  ORANGE (*) YELLOW    APPearance CLEAR  CLEAR    Specific Gravity, Urine 1.010  1.005 - 1.030    pH 6.5  5.0 - 8.0    Glucose, UA NEGATIVE  NEGATIVE (mg/dL)   Hgb urine dipstick LARGE (*) NEGATIVE    Bilirubin Urine NEGATIVE  NEGATIVE    Ketones, ur NEGATIVE  NEGATIVE (mg/dL)   Protein, ur NEGATIVE  NEGATIVE (mg/dL)   Urobilinogen, UA 0.2  0.0 - 1.0 (mg/dL)   Nitrite NEGATIVE  NEGATIVE    Leukocytes, UA LARGE (*) NEGATIVE   URINE MICROSCOPIC-ADD ON     Status: Abnormal   Collection Time   08/11/11 11:21 PM      Component Value Range   Squamous Epithelial / LPF FEW (*) RARE    WBC, UA 11-20  <3 (WBC/hpf)   RBC / HPF TOO NUMEROUS TO COUNT  <3 (RBC/hpf)   Bacteria, UA FEW (*) RARE    Urine-Other RARE YEAST    CBC     Status: Abnormal   Collection Time   08/11/11 11:39 PM      Component Value Range   WBC 21.4 (*) 4.0 - 10.5 (K/uL)   RBC 3.70 (*) 3.87 - 5.11 (MIL/uL)   Hemoglobin 11.5 (*) 12.0 - 15.0 (g/dL)   HCT 40.9 (*) 81.1 - 46.0 (%)   MCV 93.2  78.0 - 100.0 (fL)   MCH 31.1  26.0 - 34.0 (pg)   MCHC 33.3  30.0 - 36.0 (g/dL)   RDW 91.4  78.2 - 95.6 (%)   Platelets 292  150 - 400 (K/uL)  DIFFERENTIAL     Status: Abnormal   Collection Time   08/11/11 11:39 PM      Component Value Range   Neutrophils Relative 86 (*) 43 - 77 (%)   Neutro Abs 18.4 (*) 1.7 - 7.7 (K/uL)   Lymphocytes Relative 7 (*) 12 - 46 (%)   Lymphs Abs 1.5  0.7 - 4.0 (K/uL)   Monocytes Relative 6  3 - 12 (%)   Monocytes Absolute 1.3 (*) 0.1 - 1.0 (K/uL)   Eosinophils Relative 1  0 - 5 (%)   Eosinophils Absolute 0.2  0.0 - 0.7 (K/uL)   Basophils Relative 0  0 - 1 (%)   Basophils Absolute 0.0  0.0 - 0.1 (K/uL)   Assessment: Abdominal pain s/p vaginal delivery 3 days ago  Diff. Dx: Endometritis    Appendicitis    UTI  Plan:  CBC, CMET      ED Course: discussed with Dr. Vincente Poli and she will be in to further evaluate the patient.   Procedures  MDM: Dr. Vincente Poli in to evaluate patient @ 23:55 pm. Will d/c  home with Augmentin 875 bid x 7 days.    00:05 am Dr. Vincente Poli notified of CBC results. Will give Unasyn 3  grams IV and then discharge home to follow up in the office on Tuesday.

## 2011-08-11 NOTE — MAU Note (Signed)
Pt delivered, SVD,  3 days ago and c/o lower abd pain and consistant low back pain.  States she broke her tail bone during delivery and feels lower abd cramping.  She took 600mg  ibuprophen @ 2000 with minimal relief.

## 2011-08-11 NOTE — MAU Note (Signed)
Pt reports she "broke my tailbone when i was in labor" and reports pain in lower back. Vomiting that started today, fever tonight. Breastfeeding, bilateral breast tenderness.

## 2011-08-12 LAB — COMPREHENSIVE METABOLIC PANEL
ALT: 39 U/L — ABNORMAL HIGH (ref 0–35)
AST: 36 U/L (ref 0–37)
Albumin: 2.8 g/dL — ABNORMAL LOW (ref 3.5–5.2)
Alkaline Phosphatase: 183 U/L — ABNORMAL HIGH (ref 39–117)
BUN: 9 mg/dL (ref 6–23)
CO2: 24 mEq/L (ref 19–32)
Calcium: 9.1 mg/dL (ref 8.4–10.5)
Chloride: 102 mEq/L (ref 96–112)
Creatinine, Ser: 0.55 mg/dL (ref 0.50–1.10)
GFR calc Af Amer: >90 mL/min (ref >90)
GFR calc non Af Amer: >90 mL/min (ref >90)
Glucose, Bld: 100 mg/dL — ABNORMAL HIGH (ref 70–99)
Potassium: 3.7 mEq/L (ref 3.5–5.1)
Sodium: 137 mEq/L (ref 135–145)
Total Bilirubin: 0.3 mg/dL (ref 0.3–1.2)
Total Protein: 7 g/dL (ref 6.0–8.3)

## 2011-08-12 MED ORDER — LACTATED RINGERS IV SOLN
INTRAVENOUS | Status: DC
  Administered 2011-08-12: via INTRAVENOUS

## 2011-08-12 MED ORDER — AMOXICILLIN-POT CLAVULANATE 875-125 MG PO TABS: 1.0000 | ORAL_TABLET | Freq: Two times a day (BID) | ORAL | Status: AC

## 2011-08-12 MED ORDER — SODIUM CHLORIDE 0.9 % IV SOLN
3.0000 g | Freq: Once | INTRAVENOUS | Status: AC
  Administered 2011-08-12: 3 g via INTRAVENOUS
  Filled 2011-08-12: qty 3

## 2011-08-12 NOTE — Discharge Instructions (Signed)
Call the office to schedule a follow up appointment. Return here as needed.

## 2011-08-15 ENCOUNTER — Inpatient Hospital Stay (HOSPITAL_COMMUNITY): Admission: RE | Admit: 2011-08-15 | Payer: 59 | Source: Ambulatory Visit

## 2011-10-14 ENCOUNTER — Ambulatory Visit: Payer: Self-pay | Admitting: Physician Assistant

## 2011-10-14 ENCOUNTER — Ambulatory Visit (INDEPENDENT_AMBULATORY_CARE_PROVIDER_SITE_OTHER): Payer: 59 | Admitting: Physician Assistant

## 2011-10-14 ENCOUNTER — Ambulatory Visit: Payer: 59

## 2011-10-14 ENCOUNTER — Encounter: Payer: Self-pay | Admitting: Physician Assistant

## 2011-10-14 VITALS — BP 111/60 | HR 77 | Temp 97.1°F | Resp 16 | Ht 65.5 in | Wt 175.0 lb

## 2011-10-14 DIAGNOSIS — F429 Obsessive-compulsive disorder, unspecified: Secondary | ICD-10-CM

## 2011-10-14 DIAGNOSIS — M549 Dorsalgia, unspecified: Secondary | ICD-10-CM

## 2011-10-14 DIAGNOSIS — M62838 Other muscle spasm: Secondary | ICD-10-CM

## 2011-10-14 LAB — POCT URINALYSIS DIPSTICK
Bilirubin, UA: NEGATIVE
Glucose, UA: NEGATIVE
Ketones, UA: NEGATIVE
Nitrite, UA: NEGATIVE
Protein, UA: NEGATIVE
Spec Grav, UA: 1.015
Urobilinogen, UA: 0.2
pH, UA: 7.5

## 2011-10-14 LAB — POCT UA - MICROSCOPIC ONLY
Casts, Ur, LPF, POC: NEGATIVE
Crystals, Ur, HPF, POC: NEGATIVE
Mucus, UA: NEGATIVE
Yeast, UA: NEGATIVE

## 2011-10-14 LAB — POCT URINE PREGNANCY: Preg Test, Ur: NEGATIVE

## 2011-10-14 MED ORDER — CYCLOBENZAPRINE HCL 5 MG PO TABS: 5.0000 mg | ORAL_TABLET | Freq: Three times a day (TID) | ORAL | Status: AC | PRN

## 2011-10-14 MED ORDER — AMPHETAMINE-DEXTROAMPHETAMINE 10 MG PO TABS: 10.0000 mg | ORAL_TABLET | Freq: Two times a day (BID) | ORAL | Status: DC

## 2011-10-14 MED ORDER — MELOXICAM 7.5 MG PO TABS: 7.5000 mg | ORAL_TABLET | Freq: Every day | ORAL | Status: DC
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
## 2012-01-25 ENCOUNTER — Ambulatory Visit (INDEPENDENT_AMBULATORY_CARE_PROVIDER_SITE_OTHER): Payer: 59 | Admitting: Family Medicine

## 2012-01-25 ENCOUNTER — Ambulatory Visit: Payer: 59

## 2012-01-25 VITALS — BP 102/68 | HR 69 | Temp 97.8°F | Resp 18 | Ht 65.5 in | Wt 172.0 lb

## 2012-01-25 DIAGNOSIS — J9801 Acute bronchospasm: Secondary | ICD-10-CM

## 2012-01-25 DIAGNOSIS — Z23 Encounter for immunization: Secondary | ICD-10-CM

## 2012-01-25 DIAGNOSIS — M791 Myalgia, unspecified site: Secondary | ICD-10-CM

## 2012-01-25 DIAGNOSIS — R079 Chest pain, unspecified: Secondary | ICD-10-CM

## 2012-01-25 DIAGNOSIS — R05 Cough: Secondary | ICD-10-CM

## 2012-01-25 DIAGNOSIS — R059 Cough, unspecified: Secondary | ICD-10-CM

## 2012-01-25 DIAGNOSIS — R51 Headache: Secondary | ICD-10-CM

## 2012-01-25 MED ORDER — AZITHROMYCIN 250 MG PO TABS: ORAL_TABLET | ORAL | Status: DC

## 2012-01-25 MED ORDER — ALBUTEROL SULFATE HFA 108 (90 BASE) MCG/ACT IN AERS: 2.0000 | INHALATION_SPRAY | Freq: Four times a day (QID) | RESPIRATORY_TRACT | Status: DC | PRN

## 2012-01-25 NOTE — Progress Notes (Signed)
    Spoke to patient about her follow up plans with me today. She mentions that her headaches are associated with photophobia and phonophobia and sometimes nausea. Never with any focal deficits. She will take ibuprofen twice a week prn for headache and follow up with me in 14 days or sooner if needed for follow up visit and labs. Patient agrees with this follow up plan.  Eula Listen, PA-C 01/25/2012 10:20 AM

## 2012-01-25 NOTE — Progress Notes (Signed)
Subjective:    Patient ID: Kristen Davidson, female    DOB: 11-Sep-1990, 21 y.o.   MRN: [id number]  HPI Kristen Davidson is a 21 y.o. female Multiple concerns per chief complaint.  Headache and chest  Sore with deep breath, sneezing, runny nose, congestion - 6 days ago.   Hurts to take a deep breath.  Hands sting when running hot water on them.  Mom has Lupus - had testing for Lupus from rheumatologist - that was negative/normal last year.  Fifth disease at that point.  achiness all over body - few months. No heartburn, belching.  Worried that may have 5th disease again.  Headaches past week. Tx: tylenol.  Hx of asthma - uses albuterol inhaler only as needed.- more frequently recently - 1-2 puffs 2 times per day -past week.   Had baby in May. Not breastfeeding.  Tired all the time. Sleeps all the time. Sleeping 2 hours at night, then 5 hours during day.  Baby teething. Has assistance at home from her mom and dad, father of baby not involved. Marland Kitchen Up multiple times per night due to baby. Not working.   Denies depression, but feels stressed over not having a job. No hx of depression/anxiety.  During hx - notes she has been followed by Eula Listen in the past.    Review of Systems  Constitutional: Negative for fever and chills.  Respiratory: Positive for chest tightness and wheezing.   Cardiovascular: Positive for chest pain.   As above.      Objective:   Physical Exam  Constitutional: She is oriented to person, place, and time. She appears well-developed and well-nourished.  Cardiovascular: Normal rate, regular rhythm, normal heart sounds and intact distal pulses.  Exam reveals no friction rub.   No murmur heard. Pulmonary/Chest: Effort normal and breath sounds normal. No respiratory distress. She has no wheezes.  Neurological: She is alert and oriented to person, place, and time. She has normal strength. No sensory deficit.       Non focal.  Skin: Skin is warm and intact.       No facial  rash, no other rash.   Psychiatric: Her speech is normal and behavior is normal. Thought content normal. Her mood appears anxious.       Psychomotor agitation during ov - appears anxious at times, but good eye contact, normal speech, no distress.    Peak flow 370. - fair effort, predicted around 500. Clear on exam.  [facility] reading (PRIMARY) by  Dr. Neva Seat: CXR: NAD EKG: SR, no acute findings.      Assessment & Plan:  Kristen Davidson is a 21 y.o. female 1. Chest pain  EKG 12-Lead, DG Chest 2 View  2. Headache    3. Cough  DG Chest 2 View  4. Myalgia    5. Flu vaccine need  Flu vaccine greater than or equal to 3yo preservative free IM   Multiple concerns including headache, myalgias, fatigue, hypersomnolence, chest tightness/pain and wheezing.  Cough/chest tightness/wheeze and headache past week - likely viral illness/URI.  No wheeze on exam and has not used albuterol yet today. Suspect mild bronchospasm - ok to continue albuterol Q4-6h prn wheeze, but if using this more than 2-3 times per day, or continued use required - rtc. Sx care for uri - fluids, rest, rtc precautions. Tylenol ok for headache.   Fatigue, myalgias, hypersomnolence -  Discussed her concerns of lupus, with mom having this diagnosis, but as had negative testing  prior- doubt this is cause of her symptoms. Suspect multifactorial cause.  Concurrent viral illness. Stressor with baby at home and sleep schedule off - discussed routine, and allowing her parents to help with nighttime duties to allow her to get into a better sleep pattern. Prior note reviewed with possible diagnosis of OCD -  probable underlying OCD or anxiety disorder, recommended counseling - phone number given for [facility] Leep - [phone].  Plan on follow up with Eula Listen, PA - C for further follow eval in next week or two - sooner if any worsening.   Flu vaccine requested - given today.

## 2012-01-25 NOTE — Patient Instructions (Addendum)
You likely have a virus causing your chest symptoms, headache. Ok to take albuterol up to every 4 to 6 hours only as needed if wheezing or tightness with breathing with , but if you are needing this more than three times per day, or continuously using this more than then next 3-4 days - return to clinic to recheck.  If your chest symptoms are not improving in the next few days - you can take the antibiotic prescribed for a possible bronchitis. Tylenol as needed, or can take motrin as Ryan discussed. Follow up with Eula Listen, PA-C to discuss the fatigue and muscle aches further. Therapist/counseling: Rocky Link frazier: [phone]. Return to the clinic or go to the nearest emergency room if any of your symptoms worsen or new symptoms occur.

## 2012-08-10 ENCOUNTER — Ambulatory Visit (INDEPENDENT_AMBULATORY_CARE_PROVIDER_SITE_OTHER): Payer: 59 | Admitting: Internal Medicine

## 2012-08-10 VITALS — BP 123/79 | HR 86 | Temp 98.1°F | Resp 16 | Ht 65.5 in | Wt 168.4 lb

## 2012-08-10 DIAGNOSIS — R1013 Epigastric pain: Secondary | ICD-10-CM

## 2012-08-10 DIAGNOSIS — R5381 Other malaise: Secondary | ICD-10-CM

## 2012-08-10 DIAGNOSIS — M545 Low back pain, unspecified: Secondary | ICD-10-CM

## 2012-08-10 LAB — POCT CBC
Granulocyte percent: 63.2 %G (ref 37–80)
HCT, POC: 41.1 % (ref 37.7–47.9)
Hemoglobin: 12.9 g/dL (ref 12.2–16.2)
Lymph, poc: 3.1 (ref 0.6–3.4)
MCH, POC: 29.3 pg (ref 27–31.2)
MCHC: 31.4 g/dL — AB (ref 31.8–35.4)
MCV: 93.4 fL (ref 80–97)
MID (cbc): 0.9 (ref 0–0.9)
MPV: 9.5 fL (ref 0–99.8)
POC Granulocyte: 6.8 (ref 2–6.9)
POC LYMPH PERCENT: 28.9 %L (ref 10–50)
POC MID %: 7.9 %M (ref 0–12)
Platelet Count, POC: 247 10*3/uL (ref 142–424)
RBC: 4.4 M/uL (ref 4.04–5.48)
RDW, POC: 13.3 %
WBC: 10.8 10*3/uL — AB (ref 4.6–10.2)

## 2012-08-10 LAB — POCT SEDIMENTATION RATE: POCT SED RATE: 44 mm/hr — AB (ref 0–22)

## 2012-08-10 MED ORDER — OMEPRAZOLE 40 MG PO CPDR: 40.0000 mg | DELAYED_RELEASE_CAPSULE | Freq: Every day | ORAL | Status: DC

## 2012-08-10 MED ORDER — POLYETHYLENE GLYCOL 3350 17 GM/SCOOP PO POWD: 17.0000 g | Freq: Every day | ORAL | Status: DC

## 2012-08-10 NOTE — Progress Notes (Addendum)
Subjective:    Patient ID: Bernell List, female    DOB: 10-07-1990, 22 y.o.   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  HENT: Negative for trouble swallowing and neck pain.   Respiratory: Negative for shortness of breath and wheezing.   Cardiovascular: Negative for chest pain and leg swelling.  Gastrointestinal: Negative for vomiting, diarrhea and blood in stool.  Endocrine: Positive for heat intolerance. Negative for cold intolerance and polyuria.       Was cold often until pregnant and now is hot all the time  Genitourinary: Negative for dysuria, menstrual problem and pelvic pain.  Musculoskeletal: Negative for arthralgias.  Hematological: Does not bruise/bleed easily.  Psychiatric/Behavioral: Negative for sleep disturbance and dysphoric mood.       Objective:   Physical Exam BP 123/79  Pulse 86  Temp(Src) 98.1 F (36.7 C) (Oral)  Resp 16  Ht 5' 5.5" (1.664 m)  Wt 168 lb 6.4 oz (76.386 kg)  BMI 27.59 kg/m2  SpO2 100% HEENT clear including no thyromegaly Heart regular without murmur Lungs clear Abdomen soft with no organomegaly or masses/mildly tender to touch in epigastrium  Extremities without edema Nontender over lumbar area/negative straight leg raise       Results for orders  placed in visit on 08/10/12  POCT CBC      Result Value Range   WBC 10.8 (*) 4.6 - 10.2 K/uL   Lymph, poc 3.1  0.6 - 3.4   POC LYMPH PERCENT 28.9  10 - 50 %L   MID (cbc) 0.9  0 - 0.9   POC MID % 7.9  0 - 12 %M   POC Granulocyte 6.8  2 - 6.9   Granulocyte percent 63.2  37 - 80 %G   RBC 4.40  4.04 - 5.48 M/uL   Hemoglobin 12.9  12.2 - 16.2 g/dL   HCT, POC 40.9  81.1 - 47.9 %   MCV 93.4  80 - 97 fL   MCH, POC 29.3  27 - 31.2 pg   MCHC 31.4 (*) 31.8 - 35.4 g/dL   RDW, POC 91.4     Platelet Count, POC 247  142 - 424 K/uL   MPV 9.5  0 - 99.8 fL  POCT SEDIMENTATION RATE      Result Value Range   POCT SED RATE 44 (*) 0 - 22 mm/hr    Assessment & Plan:  Abdominal pain, epigastric -r/o ulcer  Other malaise and fatigue  LBP (low back pain) -will set up time for xrays-?spondylolith/congent changes  Constipation  Meds ordered this encounter  Medications  . omeprazole (PRILOSEC) 40 MG capsule    Sig: Take 1 capsule (40 mg total) by mouth daily.    Dispense:  30 capsule    Refill:  3  . polyethylene glycol powder (GLYCOLAX/MIRALAX) powder    Sig: Take 17 g  by mouth daily. For 14 days    Dispense:  3350 g    Refill:  1    Add- +hpylori call w/labs-plan

## 2012-08-11 LAB — COMPREHENSIVE METABOLIC PANEL
ALT: 16 U/L (ref 0–35)
AST: 18 U/L (ref 0–37)
Albumin: 4.8 g/dL (ref 3.5–5.2)
Alkaline Phosphatase: 81 U/L (ref 39–117)
BUN: 15 mg/dL (ref 6–23)
CO2: 26 mEq/L (ref 19–32)
Calcium: 9.7 mg/dL (ref 8.4–10.5)
Chloride: 105 mEq/L (ref 96–112)
Creat: 0.7 mg/dL (ref 0.50–1.10)
Glucose, Bld: 82 mg/dL (ref 70–99)
Potassium: 4.2 mEq/L (ref 3.5–5.3)
Sodium: 138 mEq/L (ref 135–145)
Total Bilirubin: 0.4 mg/dL (ref 0.3–1.2)
Total Protein: 8.1 g/dL (ref 6.0–8.3)

## 2012-08-11 LAB — TSH: TSH: 1.397 u[IU]/mL (ref 0.350–4.500)

## 2012-08-14 LAB — HELICOBACTER PYLORI  ANTIBODY, IGM: Helicobacter pylori, IgM: 10.1 U/mL — ABNORMAL HIGH (ref <9.0)

## 2012-08-18 ENCOUNTER — Ambulatory Visit (INDEPENDENT_AMBULATORY_CARE_PROVIDER_SITE_OTHER): Payer: 59 | Admitting: Internal Medicine

## 2012-08-18 VITALS — BP 110/68 | HR 73 | Temp 98.3°F | Resp 17 | Ht 66.5 in | Wt 167.0 lb

## 2012-08-18 DIAGNOSIS — G8929 Other chronic pain: Secondary | ICD-10-CM | POA: Insufficient documentation

## 2012-08-18 DIAGNOSIS — A048 Other specified bacterial intestinal infections: Secondary | ICD-10-CM

## 2012-08-18 DIAGNOSIS — M549 Dorsalgia, unspecified: Secondary | ICD-10-CM

## 2012-08-18 MED ORDER — AMOXICILLIN 500 MG PO CAPS: 1000.0000 mg | ORAL_CAPSULE | Freq: Two times a day (BID) | ORAL | Status: DC

## 2012-08-18 MED ORDER — CLARITHROMYCIN 500 MG PO TABS: 500.0000 mg | ORAL_TABLET | Freq: Two times a day (BID) | ORAL | Status: DC

## 2012-08-18 NOTE — Progress Notes (Signed)
  Subjective:    Patient ID: Bernell List, female    DOB: [birth year], 22 y.o.   MRN: [id number]  HPI Continues with abdominal discomfort is noted at last visit H. pylori was positive so she is here for treatment  No allergies to medications Not currently sexually active/on no birth control/following with GYN for abnormal Pap  Review of Systems     Objective:   Physical Exam BP 110/68  Pulse 73  Temp(Src) 98.3 F (36.8 C) (Oral)  Resp 17  Ht 5' 6.5" (1.689 m)  Wt 167 lb (75.751 kg)  BMI 26.55 kg/m2  SpO2 98% No acute distress       Assessment & Plan:  H. pylori infection  Meds ordered this encounter  Medications  . amoxicillin (AMOXIL) 500 MG capsule    Sig: Take 2 capsules (1,000 mg total) by mouth 2 (two) times daily.    Dispense:  56 capsule    Refill:  0  . clarithromycin (BIAXIN) 500 MG tablet    Sig: Take 1 tablet (500 mg total) by mouth 2 (two) times daily.    Dispense:  28 tablet    Refill:  0   Continue omeprazole 40 daily for one month Followup one month to recheck in to evaluate back

## 2012-08-19 ENCOUNTER — Emergency Department (HOSPITAL_COMMUNITY)
Admission: EM | Admit: 2012-08-19 | Discharge: 2012-08-20 | Disposition: A | Discharge status: Home / Self Care | Payer: 59 | Attending: Emergency Medicine | Admitting: Emergency Medicine

## 2012-08-19 ENCOUNTER — Encounter (HOSPITAL_COMMUNITY): Payer: Self-pay | Admitting: *Deleted

## 2012-08-19 ENCOUNTER — Emergency Department (HOSPITAL_COMMUNITY): Payer: 59

## 2012-08-19 DIAGNOSIS — M542 Cervicalgia: Secondary | ICD-10-CM | POA: Insufficient documentation

## 2012-08-19 DIAGNOSIS — M545 Low back pain, unspecified: Secondary | ICD-10-CM | POA: Insufficient documentation

## 2012-08-19 DIAGNOSIS — R51 Headache: Secondary | ICD-10-CM | POA: Insufficient documentation

## 2012-08-19 DIAGNOSIS — J45909 Unspecified asthma, uncomplicated: Secondary | ICD-10-CM | POA: Insufficient documentation

## 2012-08-19 DIAGNOSIS — R1013 Epigastric pain: Secondary | ICD-10-CM | POA: Insufficient documentation

## 2012-08-19 DIAGNOSIS — K259 Gastric ulcer, unspecified as acute or chronic, without hemorrhage or perforation: Secondary | ICD-10-CM | POA: Insufficient documentation

## 2012-08-19 DIAGNOSIS — R110 Nausea: Secondary | ICD-10-CM | POA: Insufficient documentation

## 2012-08-19 DIAGNOSIS — Z3202 Encounter for pregnancy test, result negative: Secondary | ICD-10-CM | POA: Insufficient documentation

## 2012-08-19 DIAGNOSIS — A048 Other specified bacterial intestinal infections: Secondary | ICD-10-CM | POA: Insufficient documentation

## 2012-08-19 DIAGNOSIS — Z79899 Other long term (current) drug therapy: Secondary | ICD-10-CM | POA: Insufficient documentation

## 2012-08-19 DIAGNOSIS — B9681 Helicobacter pylori [H. pylori] as the cause of diseases classified elsewhere: Secondary | ICD-10-CM

## 2012-08-19 LAB — URINALYSIS, ROUTINE W REFLEX MICROSCOPIC
Bilirubin Urine: NEGATIVE
Glucose, UA: NEGATIVE mg/dL
Ketones, ur: 15 mg/dL — AB
Leukocytes, UA: NEGATIVE
Nitrite: NEGATIVE
Protein, ur: NEGATIVE mg/dL
Specific Gravity, Urine: 1.024 (ref 1.005–1.030)
Urobilinogen, UA: 1 mg/dL (ref 0.0–1.0)
pH: 6.5 (ref 5.0–8.0)

## 2012-08-19 LAB — COMPREHENSIVE METABOLIC PANEL
ALT: 19 U/L (ref 0–35)
AST: 22 U/L (ref 0–37)
Albumin: 4.8 g/dL (ref 3.5–5.2)
Alkaline Phosphatase: 112 U/L (ref 39–117)
BUN: 12 mg/dL (ref 6–23)
CO2: 26 mEq/L (ref 19–32)
Calcium: 9.6 mg/dL (ref 8.4–10.5)
Chloride: 98 mEq/L (ref 96–112)
Creatinine, Ser: 0.59 mg/dL (ref 0.50–1.10)
GFR calc Af Amer: >90 mL/min (ref >90)
GFR calc non Af Amer: >90 mL/min (ref >90)
Glucose, Bld: 95 mg/dL (ref 70–99)
Potassium: 3.8 mEq/L (ref 3.5–5.1)
Sodium: 136 mEq/L (ref 135–145)
Total Bilirubin: 0.5 mg/dL (ref 0.3–1.2)
Total Protein: 9.2 g/dL — ABNORMAL HIGH (ref 6.0–8.3)

## 2012-08-19 LAB — CBC WITH DIFFERENTIAL/PLATELET
Basophils Absolute: 0 10*3/uL (ref 0.0–0.1)
Basophils Relative: 0 % (ref 0–1)
Eosinophils Absolute: 0 10*3/uL (ref 0.0–0.7)
Eosinophils Relative: 0 % (ref 0–5)
HCT: 41.6 % (ref 36.0–46.0)
Hemoglobin: 14.1 g/dL (ref 12.0–15.0)
Lymphocytes Relative: 5 % — ABNORMAL LOW (ref 12–46)
Lymphs Abs: 0.5 10*3/uL — ABNORMAL LOW (ref 0.7–4.0)
MCH: 29.3 pg (ref 26.0–34.0)
MCHC: 33.9 g/dL (ref 30.0–36.0)
MCV: 86.5 fL (ref 78.0–100.0)
Monocytes Absolute: 0.6 10*3/uL (ref 0.1–1.0)
Monocytes Relative: 6 % (ref 3–12)
Neutro Abs: 9.3 10*3/uL — ABNORMAL HIGH (ref 1.7–7.7)
Neutrophils Relative %: 90 % — ABNORMAL HIGH (ref 43–77)
Platelets: 218 10*3/uL (ref 150–400)
RBC: 4.81 MIL/uL (ref 3.87–5.11)
RDW: 12.4 % (ref 11.5–15.5)
WBC: 10.3 10*3/uL (ref 4.0–10.5)

## 2012-08-19 LAB — URINE MICROSCOPIC-ADD ON

## 2012-08-19 LAB — PREGNANCY, URINE: Preg Test, Ur: NEGATIVE

## 2012-08-19 LAB — LIPASE, BLOOD: Lipase: 37 U/L (ref 11–59)

## 2012-08-19 MED ORDER — ONDANSETRON HCL 4 MG/2ML IJ SOLN
4.0000 mg | Freq: Once | INTRAMUSCULAR | Status: AC
  Administered 2012-08-19: 4 mg via INTRAVENOUS
  Filled 2012-08-19: qty 2

## 2012-08-19 MED ORDER — FAMOTIDINE IN NACL 20-0.9 MG/50ML-% IV SOLN
20.0000 mg | Freq: Once | INTRAVENOUS | Status: AC
  Administered 2012-08-19: 20 mg via INTRAVENOUS
  Filled 2012-08-19: qty 50

## 2012-08-19 MED ORDER — ACETAMINOPHEN 325 MG PO TABS
650.0000 mg | ORAL_TABLET | Freq: Once | ORAL | Status: AC
  Administered 2012-08-19: 650 mg via ORAL
  Filled 2012-08-19: qty 2

## 2012-08-19 MED ORDER — MORPHINE SULFATE 4 MG/ML IJ SOLN
2.0000 mg | Freq: Once | INTRAMUSCULAR | Status: AC
  Administered 2012-08-19: 2 mg via INTRAVENOUS
  Filled 2012-08-19 (×2): qty 1

## 2012-08-19 MED ORDER — SODIUM CHLORIDE 0.9 % IV SOLN
1000.0000 mL | Freq: Once | INTRAVENOUS | Status: AC
  Administered 2012-08-19: 1000 mL via INTRAVENOUS

## 2012-08-19 NOTE — ED Provider Notes (Signed)
History     CSN: [id number]  Arrival date & time 08/19/12  1804   First MD Initiated Contact with Patient 08/19/12 1904      Chief Complaint  Patient presents with  . Abdominal Pain    epigastric  . Headache  . Neck Pain  . Back Pain  . Nausea    (Consider location/radiation/quality/duration/timing/severity/associated sxs/prior treatment) HPI Comments: Patient is a 22 year old female, recently diagnosed with a gastric ulcer at [facility], who presents for epigastric pain x 3 weeks. Patient states that pain has been worsening since this AM and is sharp in nature, radiating up to her chest. Patient also admits to pain in her b/l low back, frontal headache without thunderclap onset, and neck myalgias; onset after taking first dose of AMOXIL and BIAXIN for H pylori this morning. Patient further admits to nausea without emesis. Patient had bowel movement this morning which was normal in color and consistency. Patient denies fever, vision changes, tinnitus or hearing loss, difficulty speaking or swallowing, chest pain, SOB, melena or hematochezia, syncope, and numbness or tingling in her extremities.  Patient is a 22 y.o. female presenting with abdominal pain, headaches, neck pain, and back pain. The history is provided by the patient. No language interpreter was used.  Abdominal Pain Associated symptoms include abdominal pain, headaches, nausea and neck pain. Pertinent negatives include no chest pain, fever, numbness, rash, vomiting or weakness.  Headache Associated symptoms: abdominal pain, back pain (low back myalgias), nausea and neck pain   Associated symptoms: no diarrhea, no fever, no numbness and no vomiting   Neck Pain Associated symptoms: headaches   Associated symptoms: no chest pain, no fever, no numbness and no weakness   Back Pain Associated symptoms: abdominal pain and headaches   Associated symptoms: no chest pain, no dysuria, no fever, no numbness and no weakness      Past Medical History  Diagnosis Date  . Asthma   . Abnormal Pap smear     Past Surgical History  Procedure Laterality Date  . Tonsillectomy    . Wisdom tooth extraction    . Colposcopy      Family History  Problem Relation Age of Onset  . Anesthesia problems Neg Hx   . Hypotension Neg Hx   . Malignant hyperthermia Neg Hx   . Pseudochol deficiency Neg Hx   . Hypertension Mother   . Heart disease Mother     PVCs  . Anemia Mother   . Thyroid disease Mother   . Migraines Mother   . Lupus Mother   . Rheum arthritis Mother   . Hypertension Father   . Diabetes Maternal Aunt   . Thyroid disease Maternal Grandmother   . Heart disease Maternal Grandfather   . Diabetes Maternal Grandfather   . Anxiety disorder Maternal Grandfather   . Cancer Paternal Grandmother     lymphoma  . Cancer Paternal Grandfather     prostate    History  Substance Use Topics  . Smoking status: Never Smoker   . Smokeless tobacco: Never Used  . Alcohol Use: Yes     Comment: rarely    OB History   Grav Para Term Preterm Abortions TAB SAB Ect Mult Living   2 1 1  0 1 0 1 0 0 1      Review of Systems  Constitutional: Negative for fever.  HENT: Positive for neck pain. Negative for trouble swallowing.   Respiratory: Negative for shortness of breath.   Cardiovascular: Negative for  chest pain.  Gastrointestinal: Positive for nausea and abdominal pain. Negative for vomiting, diarrhea, constipation and blood in stool.  Genitourinary: Negative for dysuria and hematuria.  Musculoskeletal: Positive for back pain (low back myalgias).  Skin: Negative for color change and rash.  Neurological: Positive for headaches. Negative for syncope, speech difficulty, weakness and numbness.  All other systems reviewed and are negative.    Allergies  Review of patient's allergies indicates no known allergies.  Home Medications   Current Outpatient Rx  Name  Route  Sig  Dispense  Refill  . albuterol  (PROVENTIL HFA;VENTOLIN HFA) 108 (90 BASE) MCG/ACT inhaler   Inhalation   Inhale 2 puffs into the lungs every 6 (six) hours as needed for wheezing or shortness of breath.         Marland Kitchen amoxicillin (AMOXIL) 500 MG capsule   Oral   Take 1,000 mg by mouth 2 (two) times daily. 14 day course of therapy started 08/19/12.         . clarithromycin (BIAXIN) 500 MG tablet   Oral   Take 500 mg by mouth 2 (two) times daily. 14 day course of therapy started 08/19/12.         Marland Kitchen omeprazole (PRILOSEC) 40 MG capsule   Oral   Take 1 capsule (40 mg total) by mouth daily.   30 capsule   3     BP 91/41  Pulse 90  Temp(Src) 98.7 F (37.1 C) (Oral)  Resp 20  Ht 5\' 7"  (1.702 m)  Wt 165 lb (74.844 kg)  BMI 25.84 kg/m2  SpO2 98%  LMP 08/14/2012  Physical Exam  Nursing note and vitals reviewed. Constitutional: She is oriented to person, place, and time. She appears well-developed and well-nourished. No distress.  HENT:  Head: Normocephalic and atraumatic.  Mouth/Throat: Oropharynx is clear and moist. No oropharyngeal exudate.  Eyes: Conjunctivae and EOM are normal. Pupils are equal, round, and reactive to light. No scleral icterus.  Neck: Normal range of motion. Neck supple.  No nuchal rigidity or meningeal signs  Cardiovascular: Normal rate, regular rhythm, normal heart sounds and intact distal pulses.   Pulmonary/Chest: Effort normal and breath sounds normal. No respiratory distress. She has no wheezes. She has no rales.  Abdominal: Soft. Bowel sounds are normal. She exhibits no distension and no mass. There is tenderness. There is no rebound and no guarding.  Focal tenderness and epigastric region without peritoneal signs or palpable masses.  Musculoskeletal: Normal range of motion. She exhibits no edema.  Lymphadenopathy:    She has no cervical adenopathy.  Neurological: She is alert and oriented to person, place, and time. She has normal strength and normal reflexes. No cranial nerve deficit  or sensory deficit. She exhibits normal muscle tone. GCS eye subscore is 4. GCS verbal subscore is 5. GCS motor subscore is 6.  Skin: Skin is warm and dry. No rash noted. She is not diaphoretic. No erythema. No pallor.  Psychiatric: She has a normal mood and affect. Her behavior is normal.    ED Course  Procedures (including critical care time)  Labs Reviewed  CBC WITH DIFFERENTIAL - Abnormal; Notable for the following:    Neutrophils Relative % 90 (*)    Neutro Abs 9.3 (*)    Lymphocytes Relative 5 (*)    Lymphs Abs 0.5 (*)    All other components within normal limits  COMPREHENSIVE METABOLIC PANEL - Abnormal; Notable for the following:    Total Protein 9.2 (*)  All other components within normal limits  URINALYSIS, ROUTINE W REFLEX MICROSCOPIC - Abnormal; Notable for the following:    Hgb urine dipstick MODERATE (*)    Ketones, ur 15 (*)    All other components within normal limits  LIPASE, BLOOD  PREGNANCY, URINE  URINE MICROSCOPIC-ADD ON   Dg Abd Acute W/chest  08/19/2012   *RADIOLOGY REPORT*  Clinical Data: Abdominal pain.  History of gastric ulcer.  ACUTE ABDOMEN SERIES (ABDOMEN 2 VIEW & CHEST 1 VIEW)  Comparison: Chest 10/27/2008  Findings: Chest:  The lungs are clear.  Negative for pneumonia. Negative for heart failure or effusion.  Mild apical pleural scarring bilaterally.  Abdomen:  Moderate stool throughout the colon.  Negative for bowel obstruction or free air.  No renal calculi.  No bony abnormality.  IMPRESSION: No active cardiopulmonary abnormality.  Constipation without bowel obstruction or free air.   Original Report Authenticated By: Janeece Riggers, M.D.     Date: 08/19/2012  Rate: 140  Rhythm: sinus tachycardia  QRS Axis: normal  Intervals: normal  ST/T Wave abnormalities: nonspecific ST/T changes  Conduction Disutrbances:none  Narrative Interpretation: NSR without STEMI or ischemic changes  Old EKG Reviewed: none available I have personally reviewed and  interpreted this EKG   1. Epigastric abdominal pain   2. Gastric ulcer due to Helicobacter pylori     MDM  Patient is a 22 year old female, recently diagnosed with a gastric ulcer at [facility], who presents for epigastric pain x 3 weeks with worsening this morning. Patient also admits to pain in her b/l low back, frontal headache without thunderclap onset, and neck myalgias; onset after taking first dose of AMOXIL and BIAXIN for H pylori this morning.   Physical exam is focal tenderness to palpation in the epigastric region without peritoneal signs or palpable masses. Patient is neurovascularly intact without nuchal rigidity or meningeal signs to suspect bacterial meningitis; moving head and neck without discomfort. Will obtain acute abdominal xray to evaluate for free air given gastric ulcer history. CBC, CMP, lipase, UA, and Upreg also ordered. IVF, Zofran and Pepcid ordered for symptoms.  Labs without evidence of leukocytosis, anemia, or electrolyte balance. Liver and kidney function preserved. Acute abdominal x-ray negative for free air or bowel obstruction. Patient also no longer tachycardic and symptoms controlled in ED. Patient has remained well and nontoxic appearing, in NAD. Believe patient is appropriate for d/c with PCP follow up for further evaluation of symptoms. Will prescribe script for zofran to take as needed for nausea and patient has been instructed to continue antibiotic use. Indications for ED return discussed with patient. Patient and mother verbalized comfort and understanding with this discharge plan with no unaddressed concerns. Patient work up and management discussed with Dr. Silverio Lay who is in agreement.      Antony Madura, PA-C 08/20/12 0004

## 2012-08-19 NOTE — ED Notes (Addendum)
Pt from home with reports of epigastric pain that started about 3 weeks ago which pt has been treated for at [facility]. Pt reports being diagnosed with an ulcer with medication given. Pt also reports being told that blood work showed that she had "bacteria in her stomach" and was given an antibiotic. Pt reports that after taking medication she began to have a headache, neck pain, lower back pain and felt jittery. HR noted to be elevated (140) during triage, EKG performed.

## 2012-08-20 MED ORDER — ONDANSETRON HCL 4 MG PO TABS: 4.0000 mg | ORAL_TABLET | Freq: Four times a day (QID) | ORAL | Status: DC

## 2012-08-20 NOTE — ED Provider Notes (Signed)
Medical screening examination/treatment/procedure(s) were performed by non-physician practitioner and as supervising physician I was immediately available for consultation/collaboration.   Richardean Canal, MD 08/20/12 [id number]

## 2012-09-09 ENCOUNTER — Ambulatory Visit (INDEPENDENT_AMBULATORY_CARE_PROVIDER_SITE_OTHER): Payer: 59 | Admitting: Family Medicine

## 2012-09-09 VITALS — BP 114/75 | HR 97 | Temp 97.3°F | Resp 16 | Ht 66.0 in | Wt 167.0 lb

## 2012-09-09 DIAGNOSIS — G8929 Other chronic pain: Secondary | ICD-10-CM

## 2012-09-09 DIAGNOSIS — R1013 Epigastric pain: Secondary | ICD-10-CM

## 2012-09-09 DIAGNOSIS — K5900 Constipation, unspecified: Secondary | ICD-10-CM

## 2012-09-09 DIAGNOSIS — J069 Acute upper respiratory infection, unspecified: Secondary | ICD-10-CM

## 2012-09-09 DIAGNOSIS — R10811 Right upper quadrant abdominal tenderness: Secondary | ICD-10-CM

## 2012-09-09 NOTE — Patient Instructions (Addendum)
Constipation instructions:  Drink lots of water. Increase intake of fruits and vegetables. However, avoid bananas and cheese.  Take a walk daily, preferably after a meal.  Take MiraLax one dose, once or twice daily. After a few days if your stools start getting loose then decrease to just half a dose. If they continue loose stopped using it and use it on an as needed basis only.  If stools are hard and you are having a difficult time expelling them, use a glycerin suppository.  If despite these measures your bowels are not moving, get Dulcolax and take as directed  We will refer you for a gallbladder ultrasound and if that is negative, then we will send you to a gastroenterologist to have your stomach looked into with a scope.  In the meanwhile continue the medications that Dr. Merla Riches prescribed for you.  Treat the cold with claritin-d, fluids, rest, and time

## 2012-09-09 NOTE — Progress Notes (Signed)
Subjective: 22 year old lady who has come back with more problems from her stomach. She's still taking the medication for H. pylori, has not yet finished a regimen. As I calculated she is probably missed some doses that she thinks she is taking it regularly. She has ongoing epigastric pain. She is pain across her abdomen. She does not have a good bowel movement but about every 2 weeks. I reviewed all her labs. Her H. pylori was positive, but just minimally so.  Objective: Pleasant lady in no major distress. Chest clear. Heart regular without murmurs. Abdomen has normal bowel sounds. Is soft. It is generally tender across the epigastrium. In the apex of the high epigastrium she's very tender tender. Quite tender also at the right upper quadrant. Abdomen is a little firm across lower abdomen, consistent with stool in her gut.  Assessment: Right upper quadrant abdominal pain Epigastric pain Constipation  Plan: See discharge instructions. Will get a gallbladder ultrasound on her. If it is normal we'll probably go ahead and do a referral for an EGD also. Return if worse at anytime. Take

## 2012-09-11 ENCOUNTER — Other Ambulatory Visit: Payer: 59

## 2012-09-11 ENCOUNTER — Ambulatory Visit (HOSPITAL_COMMUNITY)
Admission: RE | Admit: 2012-09-11 | Discharge: 2012-09-11 | Disposition: A | Discharge status: Home / Self Care | Payer: 59 | Source: Ambulatory Visit | Attending: Family Medicine | Admitting: Family Medicine

## 2012-09-11 DIAGNOSIS — K8020 Calculus of gallbladder without cholecystitis without obstruction: Secondary | ICD-10-CM | POA: Insufficient documentation

## 2012-09-11 DIAGNOSIS — R10811 Right upper quadrant abdominal tenderness: Secondary | ICD-10-CM

## 2012-09-11 DIAGNOSIS — G8929 Other chronic pain: Secondary | ICD-10-CM

## 2012-09-11 DIAGNOSIS — R1013 Epigastric pain: Secondary | ICD-10-CM | POA: Insufficient documentation

## 2012-09-12 ENCOUNTER — Ambulatory Visit (INDEPENDENT_AMBULATORY_CARE_PROVIDER_SITE_OTHER): Payer: 59 | Admitting: Family Medicine

## 2012-09-12 VITALS — BP 109/69 | HR 76 | Temp 97.8°F | Resp 16 | Ht 65.75 in | Wt 165.0 lb

## 2012-09-12 DIAGNOSIS — K8050 Calculus of bile duct without cholangitis or cholecystitis without obstruction: Secondary | ICD-10-CM

## 2012-09-12 DIAGNOSIS — M2620 Unspecified anomaly of dental arch relationship: Secondary | ICD-10-CM

## 2012-09-12 DIAGNOSIS — K8020 Calculus of gallbladder without cholecystitis without obstruction: Secondary | ICD-10-CM

## 2012-09-12 MED ORDER — DICYCLOMINE HCL 10 MG PO CAPS: ORAL_CAPSULE | ORAL | Status: DC

## 2012-09-12 NOTE — Patient Instructions (Signed)
Use the dicyclomine before meals and at bedtime as needed for abdominal cramping  In the event of severe abdominal pain, to the emergency room  We will make a referral to Gen. surgery, but if you do not hear from our office by Tuesday call and speak to the referrals desk to see if it is being done.

## 2012-09-12 NOTE — Addendum Note (Signed)
Addended by: Cydney Ok on: 09/12/2012 04:34 PM   Modules accepted: Orders

## 2012-09-12 NOTE — Progress Notes (Signed)
Had a bad episode of colicky epigastric and right upper quadrant pain this morning. It subsided on its own.  Objective: Abdomen is soft, tender in the right upper quadrant  Reviewed ultrasound report with patient  Assessment: Biliary colic and cholelithiasis  Plan: [facility] referral Return if in all worse or go to emergency room

## 2012-09-28 ENCOUNTER — Ambulatory Visit (INDEPENDENT_AMBULATORY_CARE_PROVIDER_SITE_OTHER): Payer: 59 | Admitting: General Surgery

## 2012-09-30 ENCOUNTER — Encounter (HOSPITAL_COMMUNITY): Payer: Self-pay | Admitting: Pharmacy Technician

## 2012-09-30 ENCOUNTER — Ambulatory Visit (INDEPENDENT_AMBULATORY_CARE_PROVIDER_SITE_OTHER): Payer: Commercial Managed Care - PPO | Admitting: Surgery

## 2012-09-30 ENCOUNTER — Encounter (INDEPENDENT_AMBULATORY_CARE_PROVIDER_SITE_OTHER): Payer: Self-pay | Admitting: Surgery

## 2012-09-30 VITALS — BP 110/62 | HR 78 | Resp 14 | Ht 67.0 in | Wt 163.8 lb

## 2012-09-30 DIAGNOSIS — K8020 Calculus of gallbladder without cholecystitis without obstruction: Secondary | ICD-10-CM | POA: Insufficient documentation

## 2012-09-30 NOTE — Progress Notes (Signed)
 Re:   Kristen Davidson DOB:   06/06/1990 MRN:   [id number]  ASSESSMENT AND PLAN: 1.  Chronic cholecystitis, Gall stones  I discussed with the patient the indications and risks of gall bladder surgery.  The primary risks of gall bladder surgery include, but are not limited to, bleeding, infection, common bile duct injury, and open surgery.  There is also the risk that the patient may have continued symptoms after surgery.  However, the likelihood of improvement in symptoms and return to the patient's normal status is good. We discussed the typical post-operative recovery course. I tried to answer the patient's questions.  I gave the patient literature about gall bladder surgery.  We talked about outpatient surgery.  We'll see how she does.  2.  Mild scoliosis. 3.  Some history of chronic constipation.  Chief Complaint  Patient presents with  . New Evaluation    eval chole   REFERRING PHYSICIAN: DAUB, STEVE A, MD  HISTORY OF PRESENT ILLNESS: Kristen Davidson is a 21 y.o. (DOB: 12/20/1990)  white  female whose primary care physician is DAUB, STEVE A, MD and comes to me today for abdominal pain/gall stones.  The patient is accompanied by her mother. She is scheduled to start seeing John Russo this September medical physician.  For about 3 or 4 months, she has had vague abdominal pain, nausea, headaches, and chest pain. She has been to [place] urgent care for evaluation. She went to the [facility] on 08/19/2012. At first I thought her symptoms were possible ulcer disease. 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  But her symptoms persisted despite treatment. She saw Dr. Allan Bacigalupi Hopper on 09/12/2012 he obtained an ultrasound which showed a thickened gallbladder wall with gallstones. Her symptoms are better from a pain standpoint, but she still has some nausea. She has a family history of gallbladder disease. She has some trouble with chronic  constipation. She's had no prior stone disease, liver disease, pancreatic disease, or colon disease. She had no prior abdominal surgery.  US - 09/11/2012 - thick gall bladder wall, contracted, gall stones. CXR - 08/19/2012 LFT's - normal - 08/19/2012   Past Medical History  Diagnosis Date  . Asthma   . Abnormal Pap smear       Past Surgical History  Procedure Laterality Date  . Tonsillectomy    . Wisdom tooth extraction    . Colposcopy        Current Outpatient Prescriptions  Medication Sig Dispense Refill  . albuterol (PROVENTIL HFA;VENTOLIN HFA) 108 (90 BASE) MCG/ACT inhaler Inhale 2 puffs into the lungs every 6 (six) hours as needed for wheezing or shortness of breath.      . amoxicillin (AMOXIL) 500 MG capsule Take 1,000 mg by mouth 2 (two) times daily. 14 day course of therapy started 08/19/12.      . clarithromycin (BIAXIN) 500 MG tablet Take 500 mg by mouth 2 (two) times daily. 14 day course of therapy started 08/19/12.      . dicyclomine (BENTYL) 10 MG capsule Used one before meals and at bedtime as needed for abdominal cramping and pain  20 capsule  0  . omeprazole (PRILOSEC) 40 MG capsule Take 1 capsule (40 mg total) by mouth daily.  30 capsule  3  . ondansetron (ZOFRAN) 4 MG tablet Take 1 tablet (4 mg total) by mouth every 6 (six) hours.  12 tablet  0   No   current facility-administered medications for this visit.     No Known Allergies  REVIEW OF SYSTEMS: Skin:  No history of rash.  No history of abnormal moles. Infection:  No history of hepatitis or HIV.  No history of MRSA. Neurologic:  She's had some headaches with her symptoms.  Her mother has migraine headaches. Cardiac:  No history of hypertension. No history of heart disease.  No history of prior cardiac catheterization.  No history of seeing a cardiologist. Pulmonary:  Does not smoke cigarettes.  No asthma or bronchitis.  No OSA/CPAP.  Endocrine:  No diabetes. No thyroid disease. Gastrointestinal:  No history  of stomach disease.  No history of liver disease.  No history of gall bladder disease.  No history of pancreas disease.  No history of colon disease. Urologic:  No history of kidney stones.  No history of bladder infections. Musculoskeletal:  Occasional back pain.  Some scoliosis. Hematologic:  No bleeding disorder.  No history of anemia.  Not anticoagulated. Psycho-social:  The patient is oriented.   The patient has no obvious psychologic or social impairment to understanding our conversation and plan.  SOCIAL and FAMILY HISTORY: Single. Has [age] year old - Kristen Davidson. Lives with mother, Kristen Davidson.  Mother is with her.  Her mother works at the child care center at [employer]. 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 They are planning a family vacation in August.  PHYSICAL EXAM: BP 110/62  Pulse 78  Resp 14  Ht 5' 7" (1.702 m)  Wt 163 lb 12.8 oz (74.299 kg)  BMI 25.65 kg/m2  General: WN WF who is alert and generally healthy appearing.  HEENT: Normal. Pupils equal. Neck: Supple. No mass.  No thyroid mass. Lymph Nodes:  No supraclavicular or cervical nodes. Lungs: Clear to auscultation and symmetric breath sounds. Heart:  RRR. No murmur or rub. Abdomen: Soft. No mass. No tenderness. No hernia. Normal bowel sounds.  Has scar of a umbilical ring, but not ring. Rectal: Not done. Extremities:  Good strength and ROM  in upper and lower extremities. Neurologic:  Grossly intact to motor and sensory function. Psychiatric: Has normal mood and affect. Behavior is normal.   DATA REVIEWED: Epic notes and labs.  Blakelee Allington, MD,  FACS [facility] [address]  [address]   [place],     [address] Phone:  [phone] FAX:  [phone]  

## 2012-10-05 ENCOUNTER — Telehealth (INDEPENDENT_AMBULATORY_CARE_PROVIDER_SITE_OTHER): Payer: Self-pay

## 2012-10-05 NOTE — Telephone Encounter (Signed)
V/M Post op appt 10/21/12@10 :15 Advised to call to confirm

## 2012-10-08 ENCOUNTER — Encounter (HOSPITAL_COMMUNITY)
Admission: RE | Admit: 2012-10-08 | Discharge: 2012-10-08 | Disposition: A | Discharge status: Home / Self Care | Payer: 59 | Source: Ambulatory Visit | Attending: Surgery | Admitting: Surgery

## 2012-10-08 ENCOUNTER — Encounter (HOSPITAL_COMMUNITY): Payer: Self-pay

## 2012-10-08 HISTORY — DX: Headache: R51

## 2012-10-08 LAB — CBC WITH DIFFERENTIAL/PLATELET
Basophils Absolute: 0 10*3/uL (ref 0.0–0.1)
Basophils Relative: 0 % (ref 0–1)
Eosinophils Absolute: 0.4 10*3/uL (ref 0.0–0.7)
Eosinophils Relative: 6 % — ABNORMAL HIGH (ref 0–5)
HCT: 39.6 % (ref 36.0–46.0)
Hemoglobin: 13.2 g/dL (ref 12.0–15.0)
Lymphocytes Relative: 32 % (ref 12–46)
Lymphs Abs: 2.5 10*3/uL (ref 0.7–4.0)
MCH: 29.1 pg (ref 26.0–34.0)
MCHC: 33.3 g/dL (ref 30.0–36.0)
MCV: 87.4 fL (ref 78.0–100.0)
Monocytes Absolute: 1 10*3/uL (ref 0.1–1.0)
Monocytes Relative: 13 % — ABNORMAL HIGH (ref 3–12)
Neutro Abs: 3.8 10*3/uL (ref 1.7–7.7)
Neutrophils Relative %: 49 % (ref 43–77)
Platelets: 214 10*3/uL (ref 150–400)
RBC: 4.53 MIL/uL (ref 3.87–5.11)
RDW: 12.7 % (ref 11.5–15.5)
WBC: 7.7 10*3/uL (ref 4.0–10.5)

## 2012-10-08 LAB — COMPREHENSIVE METABOLIC PANEL
ALT: 22 U/L (ref 0–35)
AST: 22 U/L (ref 0–37)
Albumin: 4.3 g/dL (ref 3.5–5.2)
Alkaline Phosphatase: 96 U/L (ref 39–117)
BUN: 17 mg/dL (ref 6–23)
CO2: 26 mEq/L (ref 19–32)
Calcium: 9.5 mg/dL (ref 8.4–10.5)
Chloride: 101 mEq/L (ref 96–112)
Creatinine, Ser: 0.63 mg/dL (ref 0.50–1.10)
GFR calc Af Amer: >90 mL/min (ref >90)
GFR calc non Af Amer: >90 mL/min (ref >90)
Glucose, Bld: 95 mg/dL (ref 70–99)
Potassium: 4 mEq/L (ref 3.5–5.1)
Sodium: 137 mEq/L (ref 135–145)
Total Bilirubin: 0.5 mg/dL (ref 0.3–1.2)
Total Protein: 8.4 g/dL — ABNORMAL HIGH (ref 6.0–8.3)

## 2012-10-08 LAB — HCG, SERUM, QUALITATIVE: Preg, Serum: NEGATIVE

## 2012-10-08 MED ORDER — CEFAZOLIN SODIUM-DEXTROSE 2-3 GM-% IV SOLR
2.0000 g | INTRAVENOUS | Status: AC
  Administered 2012-10-09: 2 g via INTRAVENOUS
  Filled 2012-10-08: qty 50

## 2012-10-08 MED ORDER — CHLORHEXIDINE GLUCONATE 4 % EX LIQD: 1.0000 "application " | Freq: Once | CUTANEOUS | Status: DC

## 2012-10-08 NOTE — Pre-Procedure Instructions (Signed)
Kristen Davidson  10/08/2012   Your procedure is scheduled on:  10/09/12  Report to [facility] at 630 AM.  Call this number if you have problems the morning of surgery: [phone]   Remember:   Do not eat food or drink liquids after midnight.   Take these medicines the morning of surgery with A SIP OF WATER: inhalers   Do not wear jewelry, make-up or nail polish.  Do not wear lotions, powders, or perfumes. You may wear deodorant.  Do not shave 48 hours prior to surgery. Men may shave face and neck.  Do not bring valuables to the hospital.  [facility] is not responsible                   for any belongings or valuables.  Contacts, dentures or bridgework may not be worn into surgery.  Leave suitcase in the car. After surgery it may be brought to your room.  For patients admitted to the hospital, checkout time is 11:00 AM the day of  discharge.   Patients discharged the day of surgery will not be allowed to drive  home.  Name and phone number of your driver: family  Special Instructions: Shower using CHG 2 nights before surgery and the night before surgery.  If you shower the day of surgery use CHG.  Use special wash - you have one bottle of CHG for all showers.  You should use approximately 1/3 of the bottle for each shower.   Please read over the following fact sheets that you were given: Pain Booklet, Coughing and Deep Breathing and Surgical Site Infection Prevention

## 2012-10-09 ENCOUNTER — Encounter (HOSPITAL_COMMUNITY)
Admission: RE | Disposition: A | Discharge status: Home / Self Care | Payer: Self-pay | Source: Ambulatory Visit | Attending: Surgery

## 2012-10-09 ENCOUNTER — Observation Stay (HOSPITAL_COMMUNITY)
Admission: RE | Admit: 2012-10-09 | Discharge: 2012-10-10 | Disposition: A | Discharge status: Home / Self Care | Payer: 59 | Source: Ambulatory Visit | Attending: Surgery | Admitting: Surgery

## 2012-10-09 ENCOUNTER — Encounter (HOSPITAL_COMMUNITY): Payer: Self-pay | Admitting: Anesthesiology

## 2012-10-09 ENCOUNTER — Ambulatory Visit (HOSPITAL_COMMUNITY): Payer: 59 | Admitting: Anesthesiology

## 2012-10-09 ENCOUNTER — Ambulatory Visit (HOSPITAL_COMMUNITY): Payer: 59

## 2012-10-09 DIAGNOSIS — J45909 Unspecified asthma, uncomplicated: Secondary | ICD-10-CM | POA: Insufficient documentation

## 2012-10-09 DIAGNOSIS — K8020 Calculus of gallbladder without cholecystitis without obstruction: Secondary | ICD-10-CM | POA: Diagnosis present

## 2012-10-09 DIAGNOSIS — M4120 Other idiopathic scoliosis, site unspecified: Secondary | ICD-10-CM | POA: Insufficient documentation

## 2012-10-09 DIAGNOSIS — Z01812 Encounter for preprocedural laboratory examination: Secondary | ICD-10-CM | POA: Insufficient documentation

## 2012-10-09 DIAGNOSIS — Z01818 Encounter for other preprocedural examination: Secondary | ICD-10-CM | POA: Insufficient documentation

## 2012-10-09 DIAGNOSIS — R1011 Right upper quadrant pain: Secondary | ICD-10-CM | POA: Insufficient documentation

## 2012-10-09 DIAGNOSIS — K8010 Calculus of gallbladder with chronic cholecystitis without obstruction: Principal | ICD-10-CM | POA: Insufficient documentation

## 2012-10-09 HISTORY — PX: CHOLECYSTECTOMY: SHX55

## 2012-10-09 SURGERY — LAPAROSCOPIC CHOLECYSTECTOMY WITH INTRAOPERATIVE CHOLANGIOGRAM
Anesthesia: General | Site: Abdomen | Wound class: Clean Contaminated

## 2012-10-09 MED ORDER — MIDAZOLAM HCL 5 MG/5ML IJ SOLN
INTRAMUSCULAR | Status: DC | PRN
  Administered 2012-10-09: 2 mg via INTRAVENOUS

## 2012-10-09 MED ORDER — SODIUM CHLORIDE 0.9 % IR SOLN
Status: DC | PRN
  Administered 2012-10-09: 1000 mL

## 2012-10-09 MED ORDER — CHLORHEXIDINE GLUCONATE 4 % EX LIQD: 1.0000 "application " | Freq: Once | CUTANEOUS | Status: DC

## 2012-10-09 MED ORDER — ONDANSETRON HCL 4 MG/2ML IJ SOLN
INTRAMUSCULAR | Status: AC
  Administered 2012-10-09: 4 mg via INTRAVENOUS
  Filled 2012-10-09: qty 2

## 2012-10-09 MED ORDER — ONDANSETRON HCL 4 MG/2ML IJ SOLN
INTRAMUSCULAR | Status: DC | PRN
  Administered 2012-10-09: 4 mg via INTRAVENOUS

## 2012-10-09 MED ORDER — PROMETHAZINE HCL 25 MG/ML IJ SOLN
25.0000 mg | Freq: Once | INTRAMUSCULAR | Status: AC
  Administered 2012-10-09: 25 mg via INTRAVENOUS
  Filled 2012-10-09: qty 1

## 2012-10-09 MED ORDER — METOCLOPRAMIDE HCL 5 MG/ML IJ SOLN
10.0000 mg | Freq: Once | INTRAMUSCULAR | Status: AC
  Administered 2012-10-09: 10 mg via INTRAVENOUS

## 2012-10-09 MED ORDER — METOCLOPRAMIDE HCL 5 MG/ML IJ SOLN
INTRAMUSCULAR | Status: AC
  Filled 2012-10-09: qty 2

## 2012-10-09 MED ORDER — KCL IN DEXTROSE-NACL 20-5-0.45 MEQ/L-%-% IV SOLN
INTRAVENOUS | Status: DC
  Administered 2012-10-09 – 2012-10-10 (×2): via INTRAVENOUS
  Filled 2012-10-09 (×4): qty 1000

## 2012-10-09 MED ORDER — SODIUM CHLORIDE 0.9 % IV SOLN
INTRAVENOUS | Status: DC | PRN
  Administered 2012-10-09: 09:00:00

## 2012-10-09 MED ORDER — BUPIVACAINE HCL (PF) 0.25 % IJ SOLN
INTRAMUSCULAR | Status: AC
  Filled 2012-10-09: qty 30

## 2012-10-09 MED ORDER — ONDANSETRON HCL 4 MG/2ML IJ SOLN: 4.0000 mg | INTRAMUSCULAR | Status: DC | PRN

## 2012-10-09 MED ORDER — HYDROMORPHONE HCL PF 1 MG/ML IJ SOLN
0.2500 mg | INTRAMUSCULAR | Status: DC | PRN
  Administered 2012-10-09: 0.25 mg via INTRAVENOUS
  Administered 2012-10-09: 0.5 mg via INTRAVENOUS
  Administered 2012-10-09: 0.25 mg via INTRAVENOUS

## 2012-10-09 MED ORDER — FENTANYL CITRATE 0.05 MG/ML IJ SOLN
INTRAMUSCULAR | Status: DC | PRN
  Administered 2012-10-09: 100 ug via INTRAVENOUS
  Administered 2012-10-09: 150 ug via INTRAVENOUS

## 2012-10-09 MED ORDER — HYDROCODONE-ACETAMINOPHEN 5-325 MG PO TABS
1.0000 | ORAL_TABLET | ORAL | Status: DC | PRN
Start: 2012-10-09 — End: 2012-10-10
  Administered 2012-10-09 – 2012-10-10 (×4): 1 via ORAL
  Filled 2012-10-09 (×2): qty 2

## 2012-10-09 MED ORDER — DEXAMETHASONE SODIUM PHOSPHATE 4 MG/ML IJ SOLN
INTRAMUSCULAR | Status: DC | PRN
  Administered 2012-10-09: 4 mg via INTRAVENOUS

## 2012-10-09 MED ORDER — BUPIVACAINE HCL 0.25 % IJ SOLN
INTRAMUSCULAR | Status: DC | PRN
  Administered 2012-10-09: 26 mL

## 2012-10-09 MED ORDER — KETOROLAC TROMETHAMINE 30 MG/ML IJ SOLN
INTRAMUSCULAR | Status: DC | PRN
  Administered 2012-10-09: 30 mg via INTRAVENOUS

## 2012-10-09 MED ORDER — MORPHINE SULFATE 2 MG/ML IJ SOLN: 1.0000 mg | INTRAMUSCULAR | Status: DC | PRN

## 2012-10-09 MED ORDER — FENTANYL CITRATE 0.05 MG/ML IJ SOLN
25.0000 ug | INTRAMUSCULAR | Status: DC | PRN
  Administered 2012-10-09: 25 ug via INTRAVENOUS

## 2012-10-09 MED ORDER — ROCURONIUM BROMIDE 100 MG/10ML IV SOLN
INTRAVENOUS | Status: DC | PRN
  Administered 2012-10-09: 25 mg via INTRAVENOUS

## 2012-10-09 MED ORDER — FENTANYL CITRATE 0.05 MG/ML IJ SOLN
INTRAMUSCULAR | Status: AC
  Filled 2012-10-09: qty 2

## 2012-10-09 MED ORDER — NEOSTIGMINE METHYLSULFATE 1 MG/ML IJ SOLN
INTRAMUSCULAR | Status: DC | PRN
  Administered 2012-10-09: 2 mg via INTRAVENOUS

## 2012-10-09 MED ORDER — LIDOCAINE HCL 4 % MT SOLN
OROMUCOSAL | Status: DC | PRN
  Administered 2012-10-09: 4 mL via TOPICAL

## 2012-10-09 MED ORDER — GLYCOPYRROLATE 0.2 MG/ML IJ SOLN
INTRAMUSCULAR | Status: DC | PRN
  Administered 2012-10-09: 0.2 mg via INTRAVENOUS

## 2012-10-09 MED ORDER — PROPOFOL 10 MG/ML IV BOLUS
INTRAVENOUS | Status: DC | PRN
  Administered 2012-10-09: 200 mg via INTRAVENOUS

## 2012-10-09 MED ORDER — HYDROCODONE-ACETAMINOPHEN 5-325 MG PO TABS: 1.0000 | ORAL_TABLET | Freq: Four times a day (QID) | ORAL | Status: DC | PRN

## 2012-10-09 MED ORDER — ONDANSETRON HCL 4 MG/2ML IJ SOLN: 4.0000 mg | Freq: Once | INTRAMUSCULAR | Status: AC | PRN

## 2012-10-09 MED ORDER — LACTATED RINGERS IV SOLN
INTRAVENOUS | Status: DC | PRN
  Administered 2012-10-09 (×2): via INTRAVENOUS

## 2012-10-09 MED ORDER — LIDOCAINE HCL (CARDIAC) 20 MG/ML IV SOLN
INTRAVENOUS | Status: DC | PRN
  Administered 2012-10-09: 30 mg via INTRAVENOUS

## 2012-10-09 MED ORDER — 0.9 % SODIUM CHLORIDE (POUR BTL) OPTIME
TOPICAL | Status: DC | PRN
  Administered 2012-10-09: 1000 mL

## 2012-10-09 MED ORDER — HYDROMORPHONE HCL PF 1 MG/ML IJ SOLN
INTRAMUSCULAR | Status: AC
  Filled 2012-10-09: qty 1

## 2012-10-09 SURGICAL SUPPLY — 45 items
APPLIER CLIP 5 13 M/L LIGAMAX5 (MISCELLANEOUS) ×2
APPLIER CLIP ROT 10 11.4 M/L (STAPLE)
BLADE SURG ROTATE 9660 (MISCELLANEOUS) IMPLANT
CANISTER SUCTION 2500CC (MISCELLANEOUS) ×2 IMPLANT
CHLORAPREP W/TINT 26ML (MISCELLANEOUS) ×2 IMPLANT
CHOLANGIOGRAM CATH TAUT (CATHETERS) ×2 IMPLANT
CLIP APPLIE 5 13 M/L LIGAMAX5 (MISCELLANEOUS) ×1 IMPLANT
CLIP APPLIE ROT 10 11.4 M/L (STAPLE) IMPLANT
CLOTH BEACON ORANGE TIMEOUT ST (SAFETY) ×2 IMPLANT
COVER MAYO STAND STRL (DRAPES) ×2 IMPLANT
COVER SURGICAL LIGHT HANDLE (MISCELLANEOUS) ×2 IMPLANT
DECANTER SPIKE VIAL GLASS SM (MISCELLANEOUS) ×2 IMPLANT
DERMABOND ADVANCED (GAUZE/BANDAGES/DRESSINGS) ×1
DERMABOND ADVANCED .7 DNX12 (GAUZE/BANDAGES/DRESSINGS) ×1 IMPLANT
DRAPE C-ARM 42X72 X-RAY (DRAPES) ×2 IMPLANT
DRAPE UTILITY 15X26 W/TAPE STR (DRAPE) ×4 IMPLANT
ELECT REM PT RETURN 9FT ADLT (ELECTROSURGICAL) ×2
ELECTRODE REM PT RTRN 9FT ADLT (ELECTROSURGICAL) ×1 IMPLANT
FILTER SMOKE EVAC LAPAROSHD (FILTER) ×2 IMPLANT
GLOVE BIO SURGEON STRL SZ7.5 (GLOVE) ×2 IMPLANT
GLOVE BIOGEL PI IND STRL 7.5 (GLOVE) ×2 IMPLANT
GLOVE BIOGEL PI INDICATOR 7.5 (GLOVE) ×2
GLOVE ECLIPSE 7.5 STRL STRAW (GLOVE) ×2 IMPLANT
GLOVE SURG SIGNA 7.5 PF LTX (GLOVE) ×4 IMPLANT
GOWN STRL NON-REIN LRG LVL3 (GOWN DISPOSABLE) ×6 IMPLANT
GOWN STRL REIN XL XLG (GOWN DISPOSABLE) ×2 IMPLANT
IV CATH 14GX2 1/4 (CATHETERS) ×2 IMPLANT
KIT BASIN OR (CUSTOM PROCEDURE TRAY) ×2 IMPLANT
KIT ROOM TURNOVER OR (KITS) ×2 IMPLANT
NS IRRIG 1000ML POUR BTL (IV SOLUTION) ×2 IMPLANT
PAD ARMBOARD 7.5X6 YLW CONV (MISCELLANEOUS) ×2 IMPLANT
POUCH SPECIMEN RETRIEVAL 10MM (ENDOMECHANICALS) ×2 IMPLANT
SCISSORS LAP 5X35 DISP (ENDOMECHANICALS) IMPLANT
SET IRRIG TUBING LAPAROSCOPIC (IRRIGATION / IRRIGATOR) ×2 IMPLANT
SPECIMEN JAR SMALL (MISCELLANEOUS) ×2 IMPLANT
STOPCOCK 4 WAY LG BORE MALE ST (IV SETS) ×2 IMPLANT
SUT VIC AB 5-0 PS2 18 (SUTURE) ×2 IMPLANT
TOWEL OR 17X24 6PK STRL BLUE (TOWEL DISPOSABLE) ×2 IMPLANT
TOWEL OR 17X26 10 PK STRL BLUE (TOWEL DISPOSABLE) ×2 IMPLANT
TRAY LAPAROSCOPIC (CUSTOM PROCEDURE TRAY) ×2 IMPLANT
TROCAR XCEL BLUNT TIP 100MML (ENDOMECHANICALS) ×2 IMPLANT
TROCAR XCEL NON-BLD 11X100MML (ENDOMECHANICALS) IMPLANT
TROCAR XCEL NON-BLD 5MMX100MML (ENDOMECHANICALS) ×6 IMPLANT
TUBING EXTENTION W/L.L. (IV SETS) ×2 IMPLANT
WATER STERILE IRR 1000ML POUR (IV SOLUTION) IMPLANT

## 2012-10-09 NOTE — Anesthesia Postprocedure Evaluation (Signed)
  Anesthesia Post-op Note  Patient: Kristen Davidson  Procedure(s) Performed: Procedure(s): LAPAROSCOPIC CHOLECYSTECTOMY WITH INTRAOPERATIVE CHOLANGIOGRAM (N/A)  Patient Location: PACU  Anesthesia Type:General  Level of Consciousness: awake, alert , oriented and patient cooperative  Airway and Oxygen Therapy: Patient Spontanous Breathing  Post-op Pain: mild  Post-op Assessment: Post-op Vital signs reviewed, Patient's Cardiovascular Status Stable, Respiratory Function Stable, Patent Airway, No signs of Nausea or vomiting and Pain level controlled  Post-op Vital Signs: stable  Complications: No apparent anesthesia complications

## 2012-10-09 NOTE — Interval H&P Note (Signed)
History and Physical Interval Note:  10/09/2012 8:31 AM  Kristen Davidson  has presented today for surgery, with the diagnosis of CHOLECYSTITIS.  [name], [name]  The various methods of treatment have been discussed with the patient and family. After consideration of risks, benefits and other options for treatment, the patient has consented to  Procedure(s): LAPAROSCOPIC CHOLECYSTECTOMY WITH INTRAOPERATIVE CHOLANGIOGRAM (N/A) as a surgical intervention .    The patient's history has been reviewed, patient examined, no change in status, stable for surgery.  I have reviewed the patient's chart and labs.  Questions were answered to the patient's satisfaction.     Jettie Mannor H

## 2012-10-09 NOTE — Progress Notes (Signed)
C/O FEELING "JITTERY" AFTER PHENERGAN AND PHARMACIST ON 2ND FLOOR PHARMACY STATES THIS NOT NECESSARILY AN ALLERGY JUST INTOLERANCE

## 2012-10-09 NOTE — Progress Notes (Signed)
NAUSEA AND VOMITED ABOUT 100CC YELLOW LIQUID

## 2012-10-09 NOTE — Progress Notes (Signed)
NOS note:  Tried to go home, but awoke from surgery with a lot of nausea and vomiting.  So kept overnight.  Mother and family in room.  Doing better now, but will keep on clear liquids for now.  BP 113/60  Pulse 97  Temp(Src) 97.9 F (36.6 C) (Oral)  Resp 16  SpO2 100%  LMP 09/12/2012  Breastfeeding? No  PE: Abdomen:  Incisions okay.  BS present.  Should be ready to go home tomorrow.  Ovidio Kin, MD, [facility] Pager: [phone] Office phone:  [phone]

## 2012-10-09 NOTE — H&P (View-Only) (Signed)
Re:   Kristen Davidson DOB:   1990-08-09 MRN:   [id number]  ASSESSMENT AND PLAN: 1.  Chronic cholecystitis, Gall stones  I discussed with the patient the indications and risks of gall bladder surgery.  The primary risks of gall bladder surgery include, but are not limited to, bleeding, infection, common bile duct injury, and open surgery.  There is also the risk that the patient may have continued symptoms after surgery.  However, the likelihood of improvement in symptoms and return to the patient's normal status is good. We discussed the typical post-operative recovery course. I tried to answer the patient's questions.  I gave the patient literature about gall bladder surgery.  We talked about outpatient surgery.  We'll see how she does.  2.  Mild scoliosis. 3.  Some history of chronic constipation.  Chief Complaint  Patient presents with  . New Evaluation    eval chole   REFERRING PHYSICIAN: DAUB, STEVE Davidson, Davidson  HISTORY OF PRESENT ILLNESS: Kristen Davidson is Davidson 22 y.o. (DOB: 03-26-1991)  white  female whose primary care physician is Kristen Davidson and comes to me today for abdominal pain/gall stones.  The patient is accompanied by her mother. She is scheduled to start seeing Kristen Davidson this September medical physician.  For about 3 or 4 months, she has had vague abdominal pain, nausea, headaches, and chest pain. She has been to [place] urgent care for evaluation. She went to the [facility] Emergency Room on 08/19/2012. At first I thought her symptoms were possible ulcer disease. 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  But her symptoms persisted despite treatment. She saw Dr. Janace Hoard on 09/12/2012 he obtained an ultrasound which showed Davidson thickened gallbladder wall with gallstones. Her symptoms are better from Davidson pain standpoint, but she still has some nausea. She has Davidson family history of gallbladder disease. She has some trouble with chronic  constipation. She's had no prior stone disease, liver disease, pancreatic disease, or colon disease. She had no prior abdominal surgery.  [place] - 09/11/2012 - thick gall bladder wall, contracted, gall stones. CXR - 08/19/2012 LFT's - normal - 08/19/2012   Past Medical History  Diagnosis Date  . Asthma   . Abnormal Pap smear       Past Surgical History  Procedure Laterality Date  . Tonsillectomy    . Wisdom tooth extraction    . Colposcopy        Current Outpatient Prescriptions  Medication Sig Dispense Refill  . albuterol (PROVENTIL HFA;VENTOLIN HFA) 108 (90 BASE) MCG/ACT inhaler Inhale 2 puffs into the lungs every 6 (six) hours as needed for wheezing or shortness of breath.      Marland Kitchen amoxicillin (AMOXIL) 500 MG capsule Take 1,000 mg by mouth 2 (two) times daily. 14 day course of therapy started 08/19/12.      . clarithromycin (BIAXIN) 500 MG tablet Take 500 mg by mouth 2 (two) times daily. 14 day course of therapy started 08/19/12.      . dicyclomine (BENTYL) 10 MG capsule Used one before meals and at bedtime as needed for abdominal cramping and pain  20 capsule  0  . omeprazole (PRILOSEC) 40 MG capsule Take 1 capsule (40 mg total) by mouth daily.  30 capsule  3  . ondansetron (ZOFRAN) 4 MG tablet Take 1 tablet (4 mg total) by mouth every 6 (six) hours.  12 tablet  0   No  current facility-administered medications for this visit.     No Known Allergies  REVIEW OF SYSTEMS: Skin:  No history of rash.  No history of abnormal moles. Infection:  No history of hepatitis or HIV.  No history of MRSA. Neurologic:  She's had some headaches with her symptoms.  Her mother has migraine headaches. Cardiac:  No history of hypertension. No history of heart disease.  No history of prior cardiac catheterization.  No history of seeing Davidson cardiologist. Pulmonary:  Does not smoke cigarettes.  No asthma or bronchitis.  No OSA/CPAP.  Endocrine:  No diabetes. No thyroid disease. Gastrointestinal:  No history  of stomach disease.  No history of liver disease.  No history of gall bladder disease.  No history of pancreas disease.  No history of colon disease. Urologic:  No history of kidney stones.  No history of bladder infections. Musculoskeletal:  Occasional back pain.  Some scoliosis. Hematologic:  No bleeding disorder.  No history of anemia.  Not anticoagulated. Psycho-social:  The patient is oriented.   The patient has no obvious psychologic or social impairment to understanding our conversation and plan.  SOCIAL and FAMILY HISTORY: Single. Has one year old - Kristen Davidson. Lives with mother, Kristen Davidson.  Mother is with her.  Her mother works at the child care center at [employer]. 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 They are planning Davidson family vacation in August.  PHYSICAL EXAM: BP 110/62  Pulse 78  Resp 14  Ht 5\' 7"  (1.702 m)  Wt 163 lb 12.8 oz (74.299 kg)  BMI 25.65 kg/m2  General: WN WF who is alert and generally healthy appearing.  HEENT: Normal. Pupils equal. Neck: Supple. No mass.  No thyroid mass. Lymph Nodes:  No supraclavicular or cervical nodes. Lungs: Clear to auscultation and symmetric breath sounds. Heart:  RRR. No murmur or rub. Abdomen: Soft. No mass. No tenderness. No hernia. Normal bowel sounds.  Has scar of Davidson umbilical ring, but not ring. Rectal: Not done. Extremities:  Good strength and ROM  in upper and lower extremities. Neurologic:  Grossly intact to motor and sensory function. Psychiatric: Has normal mood and affect. Behavior is normal.   DATA REVIEWED: Epic notes and labs.  Ovidio Kin, Davidson,  [facility] [address] [place]  [address]   [place], [place]    [address] Phone:  [phone] FAX:  [phone]

## 2012-10-09 NOTE — Anesthesia Procedure Notes (Signed)
Procedure Name: Intubation Date/Time: 10/09/2012 8:44 AM Performed by: Leona Singleton A Pre-anesthesia Checklist: Patient identified, Emergency Drugs available, Suction available and Patient being monitored Patient Re-evaluated:Patient Re-evaluated prior to inductionOxygen Delivery Method: Circle system utilized Preoxygenation: Pre-oxygenation with 100% oxygen Intubation Type: IV induction Ventilation: Mask ventilation without difficulty Laryngoscope Size: Miller and 2 Grade View: Grade I Tube type: Oral Tube size: 7.5 mm Number of attempts: 1 Airway Equipment and Method: Stylet and LTA kit utilized Placement Confirmation: ETT inserted through vocal cords under direct vision,  positive ETCO2 and breath sounds checked- equal and bilateral Secured at: 21 cm Tube secured with: Tape Dental Injury: Teeth and Oropharynx as per pre-operative assessment

## 2012-10-09 NOTE — Anesthesia Preprocedure Evaluation (Addendum)
Anesthesia Evaluation  Patient identified by MRN, date of birth, ID band Patient awake    Reviewed: Allergy & Precautions, H&P , NPO status , Patient's Chart, lab work & pertinent test results  History of Anesthesia Complications Negative for: history of anesthetic complications  Airway Mallampati: II TM Distance: >3 FB Neck ROM: Full    Dental  (+) Teeth Intact and Dental Advisory Given   Pulmonary asthma ,          Cardiovascular negative cardio ROS      Neuro/Psych  Headaches, negative psych ROS   GI/Hepatic Neg liver ROS, Chronic cholecystitis, Gall stones   Endo/Other  negative endocrine ROS  Renal/GU negative Renal ROS     Musculoskeletal   Abdominal   Peds  Hematology negative hematology ROS (+)   Anesthesia Other Findings   Reproductive/Obstetrics negative OB ROS                       Anesthesia Physical Anesthesia Plan  ASA: I  Anesthesia Plan: General   Post-op Pain Management:    Induction: Intravenous  Airway Management Planned: Oral ETT  Additional Equipment:   Intra-op Plan:   Post-operative Plan: Extubation in OR  Informed Consent: I have reviewed the patients History and Physical, chart, labs and discussed the procedure including the risks, benefits and alternatives for the proposed anesthesia with the patient or authorized representative who has indicated his/her understanding and acceptance.     Plan Discussed with: CRNA, Anesthesiologist and Surgeon  Anesthesia Plan Comments:         Anesthesia Quick Evaluation

## 2012-10-09 NOTE — Op Note (Signed)
10/09/2012  9:56 AM  PATIENT:  Kristen Davidson, 22 y.o., female, MRN: [id number]  PREOP DIAGNOSIS:  CHOLECYSTITIS   POSTOP DIAGNOSIS:   Chronic cholecystitis  PROCEDURE:   Procedure(s):  LAPAROSCOPIC CHOLECYSTECTOMY WITH INTRAOPERATIVE CHOLANGIOGRAM  SURGEON:   Ovidio Kin, M.D.  ASSISTANT:   None  ANESTHESIA:   general  Anesthesiologist: Rivka Barbara, MD  General  ASA: 1  EBL:  minimal  ml  BLOOD ADMINISTERED: none  DRAINS: none   LOCAL MEDICATIONS USED:   25 cc 1/4% marcaine  SPECIMEN:   Gall bladder  COUNTS CORRECT:  YES  INDICATIONS FOR PROCEDURE:  Kristen Davidson is a 22 y.o. (DOB: 25-Jul-1990) white  female whose primary care physician is DAUB, STEVE A, MD and comes for cholecystectomy.   The indications and risks of the gall bladder surgery were explained to the patient.  The risks include, but are not limited to, infection, bleeding, common bile duct injury and open surgery.  SURGERY:  The patient was taken to room #3 at [facility].  The abdomen was prepped with chloroprep.  The patient was given 2 gm Ancef at the beginning of the operation.   A time out was held and the surgical checklist run.   An infraumbilical incision was made into the abdominal cavity.  A 12 mm Hasson trocar was inserted into the abdominal cavity through the infraumbilical incision and secured with a 0 Vicryl suture.  Three additional trocars were inserted: a 5 mm trocar in the sub-xiphoid location, a 5 mm trocar in the right mid subcostal area, and a 5 mm trocar in the right lateral subcostal area.   The abdomen was explored and the liver, stomach, and bowel that could be seen were unremarkable.   The gall bladder was identified, grasped, and rotated cephalad.  There were adhesions of the gall bladder to the duodenum.  These were taken down sharply.  Disssection was carried down to the gall bladder/cystic duct junction and the cystic duct isolated.  A clip was placed on the  gall bladder side of the cystic duct.   An intra-operative cholangiogram was shot.   The intra-operative cholangiogram was shot using a cut off Taut catheter placed through a 14 gauge angiocath in the RUQ.  The Taut catheter was inserted in the cut cystic duct and secured with an endoclip.  A cholangiogram was shot with 10 cc of 1/2 strength Omnipaque.  Using fluoroscopy, the cholangiogram showed the flow of contrast into the common bile duct, up the hepatic radicals, and into the duodenum.  There was no mass or obstruction.  This was a normal intra-operative cholangiogram.   The Taut catheter was removed.  The cystic duct was tripley endoclipped and the cystic artery was identified and clipped.  The gall bladder was bluntly and sharpley dissected from the gall bladder bed.   After the gall bladder was removed from the liver, the gall bladder bed and Triangle of [place] were inspected.  There was no bleeding or bile leak.  The gall bladder was placed in a endocatch bag and delivered through the umbilicus.  The abdomen was irrigated with 300 cc saline.   The trocars were then removed.  I infiltrated 25 cc of 1/4% Marcaine into the incisions.  The umbilical port closed with a 0 Vicryl suture and the skin closed with 5-0 vicryl.  The skin was painted with Dermabond.  The patient's sponge and needle count were correct.  The patient was transported to the  RR in good condition.   The plan is to try to let the patient go home today.  Ovidio Kin, MD, [facility] Pager: [phone] Office phone:  [phone]

## 2012-10-09 NOTE — Transfer of Care (Signed)
Immediate Anesthesia Transfer of Care Note  Patient: Kristen Davidson  Procedure(s) Performed: Procedure(s): LAPAROSCOPIC CHOLECYSTECTOMY WITH INTRAOPERATIVE CHOLANGIOGRAM (N/A)  Patient Location: PACU  Anesthesia Type:General  Level of Consciousness: awake, alert , oriented and patient cooperative  Airway & Oxygen Therapy: Patient Spontanous Breathing and Patient connected to nasal cannula oxygen  Post-op Assessment: Report given to PACU RN and Post -op Vital signs reviewed and stable  Post vital signs: Reviewed and stable  Complications: No apparent anesthesia complications

## 2012-10-10 NOTE — Progress Notes (Signed)
1 Day Post-Op   Assessment: s/p Procedure(s): LAPAROSCOPIC CHOLECYSTECTOMY WITH INTRAOPERATIVE CHOLANGIOGRAM Patient Active Problem List   Diagnosis Date Noted  . Gall stones 09/30/2012  . H. pylori infection 08/18/2012  . Chronic back pain 08/18/2012    Improved and ready to go home  Plan: Discharge  Subjective: Feels better, no nausea, hungry, ambulating halls, still mild ruq pain., wants to go home  Objective: Vital signs in last 24 hours: Temp:  [97 F (36.1 C)-98.4 F (36.9 C)] 98.4 F (36.9 C) (07/12 0455) Pulse Rate:  [65-97] 69 (07/12 0455) Resp:  [12-20] 18 (07/12 0455) BP: (94-113)/(45-74) 95/51 mmHg (07/12 0455) SpO2:  [95 %-100 %] 99 % (07/12 0455)   Intake/Output from previous day: 07/11 0701 - 07/12 0700 In: 4839.2 [P.O.:960; I.V.:3879.2] Out: 50 [Blood:50]  General appearance: alert, cooperative and no distress Resp: clear to auscultation bilaterally GI: soft, not tender, BS+  Incision: healing well  Lab Results:   Recent Labs  10/08/12 0829  WBC 7.7  HGB 13.2  HCT 39.6  PLT 214   BMET  Recent Labs  10/08/12 0833  NA 137  K 4.0  CL 101  CO2 26  GLUCOSE 95  BUN 17  CREATININE 0.63  CALCIUM 9.5    MEDS, Scheduled    Studies/Results: Dg Cholangiogram Operative  10/09/2012   *RADIOLOGY REPORT*  Clinical Data: Cholecystitis, intraoperative cholangiogram  INTRAOPERATIVE CHOLANGIOGRAM  Technique:  Multiple fluoroscopic spot radiographs were obtained during intraoperative cholangiogram and are submitted for interpretation post-operatively.  Comparison: None.  Findings: Intraoperative cholangiogram performed following the Laproscopic cholecystectomy.  Visualized biliary confluence, common hepatic duct, residual cystic duct, and common bile duct are patent.  Pancreatic duct is also visualized and unremarkable. Contrast passes freely into the duodenum.  No definite filling defect or obstruction.  IMPRESSION: Patent biliary system.    Original Report Authenticated By: Judie Petit. Miles Costain, M.D.      LOS: 1 day     Currie Paris, MD, [facility], [place] [phone]   10/10/2012 8:58 AM

## 2012-10-12 NOTE — Discharge Summary (Signed)
Physician Discharge Summary  Patient ID:  Kristen Davidson  MRN: [id number]  DOB/AGE: 06-03-1990 22 y.o.  Admit date: 10/09/2012 Discharge date: 10/10/2012  Discharge Diagnoses:  1.  Gall stones, chronic cholecystitis  Operation: Procedure(s): LAPAROSCOPIC CHOLECYSTECTOMY WITH INTRAOPERATIVE CHOLANGIOGRAM on 10/09/2012 - D. Ezzard Standing  Discharged Condition: good  Hospital Course: Kristen Davidson is an 22 y.o. female whose primary care physician is DAUB, Stan Head, MD and who was admitted 10/09/2012 with a chief complaint of symptomatic gallstones.   She was brought to the operating room on 10/09/2012 and underwent   LAPAROSCOPIC CHOLECYSTECTOMY WITH INTRAOPERATIVE CHOLANGIOGRAM.   She tried to go home the day of surgery, but she awoke with nausea and vomiting.  So she was kept overnight.  By the morning of 10/10/2012, she was feeling better and ready to go home discharge.  The discharge instructions were reviewed with the patient.  Consults: None  Significant Diagnostic Studies: Results for orders placed during the hospital encounter of 10/08/12  HCG, SERUM, QUALITATIVE      Result Value Range   Preg, Serum NEGATIVE  NEGATIVE  CBC WITH DIFFERENTIAL      Result Value Range   WBC 7.7  4.0 - 10.5 K/uL   RBC 4.53  3.87 - 5.11 MIL/uL   Hemoglobin 13.2  12.0 - 15.0 g/dL   HCT 40.9  81.1 - 91.4 %   MCV 87.4  78.0 - 100.0 fL   MCH 29.1  26.0 - 34.0 pg   MCHC 33.3  30.0 - 36.0 g/dL   RDW 78.2  95.6 - 21.3 %   Platelets 214  150 - 400 K/uL   Neutrophils Relative % 49  43 - 77 %   Neutro Abs 3.8  1.7 - 7.7 K/uL   Lymphocytes Relative 32  12 - 46 %   Lymphs Abs 2.5  0.7 - 4.0 K/uL   Monocytes Relative 13 (*) 3 - 12 %   Monocytes Absolute 1.0  0.1 - 1.0 K/uL   Eosinophils Relative 6 (*) 0 - 5 %   Eosinophils Absolute 0.4  0.0 - 0.7 K/uL   Basophils Relative 0  0 - 1 %   Basophils Absolute 0.0  0.0 - 0.1 K/uL  COMPREHENSIVE METABOLIC PANEL      Result Value Range   Sodium 137  135 - 145 mEq/L    Potassium 4.0  3.5 - 5.1 mEq/L   Chloride 101  96 - 112 mEq/L   CO2 26  19 - 32 mEq/L   Glucose, Bld 95  70 - 99 mg/dL   BUN 17  6 - 23 mg/dL   Creatinine, Ser 0.86  0.50 - 1.10 mg/dL   Calcium 9.5  8.4 - 57.8 mg/dL   Total Protein 8.4 (*) 6.0 - 8.3 g/dL   Albumin 4.3  3.5 - 5.2 g/dL   AST 22  0 - 37 U/L   ALT 22  0 - 35 U/L   Alkaline Phosphatase 96  39 - 117 U/L   Total Bilirubin 0.5  0.3 - 1.2 mg/dL   GFR calc non Af Amer >90  >90 mL/min   GFR calc Af Amer >90  >90 mL/min    Dg Cholangiogram Operative  10/09/2012   *RADIOLOGY REPORT*  Clinical Data: Cholecystitis, intraoperative cholangiogram  INTRAOPERATIVE CHOLANGIOGRAM  Technique:  Multiple fluoroscopic spot radiographs were obtained during intraoperative cholangiogram and are submitted for interpretation post-operatively.  Comparison: None.  Findings: Intraoperative cholangiogram performed following the Laproscopic cholecystectomy.  Visualized biliary confluence, common hepatic duct, residual cystic duct, and common bile duct are patent.  Pancreatic duct is also visualized and unremarkable. Contrast passes freely into the duodenum.  No definite filling defect or obstruction.  IMPRESSION: Patent biliary system.   Original Report Authenticated By: Judie Petit. Miles Costain, M.D.    Discharge Exam:  Filed Vitals:   10/10/12 0455  BP: 95/51  Pulse: 69  Temp: 98.4 F (36.9 C)  Resp: 18   General: WN WF who is alert.  Abdomen: Soft. No hernia. Normal bowel sounds. Incisions okay.  Discharge Medications:     Medication List         albuterol 108 (90 BASE) MCG/ACT inhaler  Commonly known as:  PROVENTIL HFA;VENTOLIN HFA  Inhale 2 puffs into the lungs every 6 (six) hours as needed for wheezing or shortness of breath.     HYDROcodone-acetaminophen 5-325 MG per tablet  Commonly known as:  NORCO/VICODIN  Take 1-2 tablets by mouth every 6 (six) hours as needed for pain.        Disposition: 01-Home or Self Care      Discharge Orders    Future Appointments Provider Department Dept Phone   10/21/2012 10:15 AM Kandis Cocking, MD [facility], [place] [phone]   Future Orders Complete By Expires     Diet - low sodium heart healthy  As directed     Discharge patient  As directed     Increase activity slowly  As directed        Follow-up Information   Follow up with [facility], MD In 2 weeks.   Contact information:   [address] [place] [address] [phone]      Return to work on:  In about one week, 10/16/2012  Activity:  Driving - may drive in 2 or three days, if doing well   Lifting - No lifting > 15 pounds for 5 days, then no limit  Wound Care:   Leave incisions dry for 2 days, then may shower.            No public water (lakes, pools, etc) until seen back in office.  Diet:  Low fat diet for 3 weeks, then diet as tolerated.  Follow up appointment:  Call Dr. Allene Pyo office [facility]) at [phone] for an appointment in 2 to 3 weeks.  Medications and dosages:  Resume your home medications.  You have a prescription for:  Vicodin  Signed: Ovidio Kin, M.D., [facility]  10/12/2012, 9:18 AM

## 2012-10-13 ENCOUNTER — Encounter (HOSPITAL_COMMUNITY): Payer: Self-pay | Admitting: Surgery

## 2012-10-15 ENCOUNTER — Encounter (INDEPENDENT_AMBULATORY_CARE_PROVIDER_SITE_OTHER): Payer: Self-pay | Admitting: *Deleted

## 2012-10-15 ENCOUNTER — Telehealth (INDEPENDENT_AMBULATORY_CARE_PROVIDER_SITE_OTHER): Payer: Self-pay | Admitting: *Deleted

## 2012-10-15 ENCOUNTER — Encounter (INDEPENDENT_AMBULATORY_CARE_PROVIDER_SITE_OTHER): Payer: Self-pay

## 2012-10-15 NOTE — Telephone Encounter (Signed)
Patient is concerned about going back to work this week. 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  Patient also states some nausea still.  Spoke to Dr. Ezzard Standing in office who approved for patient to wait to go back to work until after her 1st PO appt on 7/23.  Letter written at this time and patient will pick it up at the front desk.

## 2012-10-21 ENCOUNTER — Ambulatory Visit (INDEPENDENT_AMBULATORY_CARE_PROVIDER_SITE_OTHER): Payer: Commercial Managed Care - PPO | Admitting: Surgery

## 2012-10-21 ENCOUNTER — Encounter (INDEPENDENT_AMBULATORY_CARE_PROVIDER_SITE_OTHER): Payer: Self-pay | Admitting: Surgery

## 2012-10-21 VITALS — BP 110/64 | HR 72 | Resp 16 | Ht 67.0 in | Wt 162.8 lb

## 2012-10-21 DIAGNOSIS — K8020 Calculus of gallbladder without cholecystitis without obstruction: Secondary | ICD-10-CM

## 2012-10-21 NOTE — Progress Notes (Signed)
Re:   Kristen Davidson DOB:   [birth year] MRN:   [id number]  ASSESSMENT AND PLAN: 1.  Chronic cholecystitis, Gall stones  Lap chole with IOC - 10/09/2012 - D. [facility]  Doing well.  Return appt PRN.  2.  Mild scoliosis. 3.  Some history of chronic constipation.  Chief Complaint  Patient presents with  . Routine Post Op    1st po lap chole   REFERRING PHYSICIAN: DAUB, STEVE A, MD  HISTORY OF PRESENT ILLNESS: Kristen Davidson is a 22 y.o. (DOB: [birth year])  white  female whose primary care physician is DAUB, STEVE A, MD and comes to me today for follow up of a lap chole.  She had a lot of questions, but is overall doing well. She's had a headache post op - I think this is more from nerves.  She is trying to set up with Dr. Timothy Lasso as a PCP.  If the headaches continue, she can run that by him. I wrote a note to return to work 10/22/2012. She is going rafting at the end of August, which should be okay. She wants to drink alcohol, which in moderation, is okay.  History of gall bladder disease: For about 3 or 4 months, she has had vague abdominal pain, nausea, headaches, and chest pain. She has been to [place] urgent care for evaluation. She went to the [facility] Emergency Room on 08/19/2012. At first I thought her symptoms were possible ulcer disease. 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  But her symptoms persisted despite treatment. She saw Dr. Janace Hoard on 09/12/2012 he obtained an ultrasound which showed a thickened gallbladder wall with gallstones. Her symptoms are better from a pain standpoint, but she still has some nausea. She has a family history of gallbladder disease. She has some trouble with chronic constipation. She's had no prior stone disease, liver disease, pancreatic disease, or colon disease. She had no prior abdominal surgery.  [place] - 09/11/2012 - thick gall bladder wall, contracted, gall stones. CXR - 08/19/2012 LFT's - normal -  08/19/2012   Past Medical History  Diagnosis Date  . Asthma   . Abnormal Pap smear   . [id number](846.9)      Current Outpatient Prescriptions  Medication Sig Dispense Refill  . albuterol (PROVENTIL HFA;VENTOLIN HFA) 108 (90 BASE) MCG/ACT inhaler Inhale 2 puffs into the lungs every 6 (six) hours as needed for wheezing or shortness of breath.      Marland Kitchen HYDROcodone-acetaminophen (NORCO/VICODIN) 5-325 MG per tablet Take 1-2 tablets by mouth every 6 (six) hours as needed for pain.  30 tablet  1   No current facility-administered medications for this visit.     No Known Allergies  REVIEW OF SYSTEMS: Neurologic:  She's had some headaches with her symptoms.  Her mother has migraine headaches. Musculoskeletal:  Occasional back pain.  Some scoliosis.  SOCIAL and FAMILY HISTORY: Single. Has one year old - [place]. Lives with mother, Kristen Davidson.  Mother is with her.  Her mother works at the [employer] at [employer]. 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 They are planning a family vacation in August.  PHYSICAL EXAM: BP 110/64  Pulse 72  Resp 16  Ht 5\' 7"  (1.702 m)  Wt 162 lb 12.8 oz (73.846 kg)  BMI 25.49 kg/m2  LMP 09/08/2012  General: WN WF who is alert. Abdomen: Soft. No mass. No tenderness. No hernia. Normal bowel sounds.  Incisions look good.  DATA REVIEWED: Path to patient.  Ovidio Kin, MD,  [facility] [address] [place]  [address]   [place], [place]    [address] Phone:  [phone] FAX:  [phone]

## 2012-12-31 ENCOUNTER — Ambulatory Visit (INDEPENDENT_AMBULATORY_CARE_PROVIDER_SITE_OTHER): Payer: 59 | Admitting: Physician Assistant

## 2012-12-31 VITALS — BP 116/86 | HR 136 | Temp 99.0°F | Resp 16 | Ht 66.5 in | Wt 167.0 lb

## 2012-12-31 DIAGNOSIS — R000 Tachycardia, unspecified: Secondary | ICD-10-CM

## 2012-12-31 DIAGNOSIS — J069 Acute upper respiratory infection, unspecified: Secondary | ICD-10-CM

## 2012-12-31 DIAGNOSIS — R509 Fever, unspecified: Secondary | ICD-10-CM

## 2012-12-31 LAB — POCT CBC
Granulocyte percent: 76.3 %G (ref 37–80)
HCT, POC: 39.3 % (ref 37.7–47.9)
Hemoglobin: 12.5 g/dL (ref 12.2–16.2)
Lymph, poc: 1.2 (ref 0.6–3.4)
MCH, POC: 29.9 pg (ref 27–31.2)
MCHC: 31.8 g/dL (ref 31.8–35.4)
MCV: 94 fL (ref 80–97)
MID (cbc): 0.9 (ref 0–0.9)
MPV: 10.1 fL (ref 0–99.8)
POC Granulocyte: 6.6 (ref 2–6.9)
POC LYMPH PERCENT: 13.6 %L (ref 10–50)
POC MID %: 10.1 %M (ref 0–12)
Platelet Count, POC: 198 10*3/uL (ref 142–424)
RBC: 4.18 M/uL (ref 4.04–5.48)
RDW, POC: 13.6 %
WBC: 8.6 10*3/uL (ref 4.6–10.2)

## 2012-12-31 MED ORDER — GUAIFENESIN ER 1200 MG PO TB12: 1.0000 | ORAL_TABLET | Freq: Two times a day (BID) | ORAL | Status: AC

## 2012-12-31 MED ORDER — TRIAMCINOLONE ACETONIDE(NASAL) 55 MCG/ACT NA INHA: 2.0000 | Freq: Every day | NASAL | Status: DC

## 2012-12-31 NOTE — Patient Instructions (Addendum)
Check your pulse daily - normal is between 60-100 beats per minute - if your pulse stays above 100 after you have returned to normal eating and drinking - please recheck with [place].  For your body aches - Motrin 200mg  it is ok to take up to 800mg  3x per day - That is 4 pills 3x/day  Blue box mucinex - brand name

## 2012-12-31 NOTE — Progress Notes (Signed)
[address], [place] [address]   Phone [phone]  Subjective:    Patient ID: Kristen Davidson, female    DOB: 1991-01-09, 22 y.o.   MRN: [id number]  HPI Pt presents to clinic with 1 day h/o cold symptoms.  She started to feel bad right before she went to bed last night and then today her symptoms have worsened.  She has significant myalgias with chills all day - she cannot get warm.  She has throat pain but her throat is not sore she thinks that it is from her lymph nodes being swollen. She is a Production assistant, radio at [employer] - she has been at the hospital with her mom over the last several days.  She had her flu vaccine 3 weeks ago.  She has not had an appetite today has only had a couple of sips of fluids.  She took an ibuprofen about 11am this am.  Review of Systems  Constitutional: Positive for chills and appetite change (decrease today). Negative for fever.  HENT: Positive for ear pain, congestion, sore throat, rhinorrhea (clear), sneezing, neck stiffness and postnasal drip.   Respiratory: Positive for cough.        H/o asthma  Gastrointestinal: Negative for nausea.  Musculoskeletal: Positive for myalgias.  Allergic/Immunologic: Positive for environmental allergies (has fall allergies and is having some symptoms but this is worse than her normal allergies).  Neurological: Positive for headaches.       Objective:   Physical Exam  Vitals reviewed. Constitutional: She is oriented to person, place, and time. She appears well-developed and well-nourished.  HENT:  Head: Normocephalic and atraumatic.  Right Ear: Hearing, tympanic membrane, external ear and ear canal normal.  Left Ear: Hearing, tympanic membrane, external ear and ear canal normal.  Nose: Mucosal edema (very pale) present.  Mouth/Throat: Uvula is midline, oropharynx is clear and moist and mucous membranes are normal.  Eyes: Conjunctivae are normal.  Neck: Normal range of motion.  Cardiovascular: Regular rhythm.   Tachycardia present.   No murmur heard. Pulmonary/Chest: Effort normal and breath sounds normal. She has no wheezes.  Lymphadenopathy:    She has cervical adenopathy (Ac tender and slightly enlarged).  Neurological: She is alert and oriented to person, place, and time.  Skin: Skin is warm and dry.  Psychiatric: She has a normal mood and affect. Her behavior is normal. Judgment and thought content normal.   Results for orders placed in visit on 12/31/12  POCT CBC      Result Value Range   WBC 8.6  4.6 - 10.2 K/uL   Lymph, poc 1.2  0.6 - 3.4   POC LYMPH PERCENT 13.6  10 - 50 %L   MID (cbc) 0.9  0 - 0.9   POC MID % 10.1  0 - 12 %M   POC Granulocyte 6.6  2 - 6.9   Granulocyte percent 76.3  37 - 80 %G   RBC 4.18  4.04 - 5.48 M/uL   Hemoglobin 12.5  12.2 - 16.2 g/dL   HCT, POC 21.3  08.6 - 47.9 %   MCV 94.0  80 - 97 fL   MCH, POC 29.9  27 - 31.2 pg   MCHC 31.8  31.8 - 35.4 g/dL   RDW, POC 57.8     Platelet Count, POC 198  142 - 424 K/uL   MPV 10.1  0 - 99.8 fL       Assessment & Plan:  Fever, unspecified - Plan: POCT CBC  URI, acute - Plan: triamcinolone (NASACORT) 55 MCG/ACT nasal inhaler, Guaifenesin (MUCINEX MAXIMUM STRENGTH) 1200 MG TB12  Tachycardia - pt to monitor and RTC if continues to be >100 - reviewed her past pulses and it typically runs in the 70s.  Pt to push lfuids and advance diet as she tolerates.  Benny Lennert PA-C 12/31/2012 8:34 PM

## 2013-06-17 ENCOUNTER — Encounter: Payer: Self-pay | Admitting: Physician Assistant

## 2013-06-17 ENCOUNTER — Ambulatory Visit (INDEPENDENT_AMBULATORY_CARE_PROVIDER_SITE_OTHER): Payer: Managed Care, Other (non HMO) | Admitting: Physician Assistant

## 2013-06-17 VITALS — BP 122/70 | HR 111 | Temp 98.4°F | Resp 17 | Ht 67.0 in | Wt 167.0 lb

## 2013-06-17 DIAGNOSIS — M2550 Pain in unspecified joint: Secondary | ICD-10-CM

## 2013-06-17 DIAGNOSIS — J069 Acute upper respiratory infection, unspecified: Secondary | ICD-10-CM

## 2013-06-17 DIAGNOSIS — R059 Cough, unspecified: Secondary | ICD-10-CM

## 2013-06-17 DIAGNOSIS — R05 Cough: Secondary | ICD-10-CM

## 2013-06-17 DIAGNOSIS — J029 Acute pharyngitis, unspecified: Secondary | ICD-10-CM

## 2013-06-17 LAB — POCT CBC
Granulocyte percent: 60.8 %G (ref 37–80)
HCT, POC: 40.7 % (ref 37.7–47.9)
Hemoglobin: 13.2 g/dL (ref 12.2–16.2)
Lymph, poc: 1.7 (ref 0.6–3.4)
MCH, POC: 30.1 pg (ref 27–31.2)
MCHC: 32.4 g/dL (ref 31.8–35.4)
MCV: 93 fL (ref 80–97)
MID (cbc): 0.9 (ref 0–0.9)
MPV: 9.5 fL (ref 0–99.8)
POC Granulocyte: 4.1 (ref 2–6.9)
POC LYMPH PERCENT: 25.3 %L (ref 10–50)
POC MID %: 13.9 %M — AB (ref 0–12)
Platelet Count, POC: 242 10*3/uL (ref 142–424)
RBC: 4.38 M/uL (ref 4.04–5.48)
RDW, POC: 13.6 %
WBC: 6.8 10*3/uL (ref 4.6–10.2)

## 2013-06-17 LAB — COMPREHENSIVE METABOLIC PANEL
ALT: 22 U/L (ref 0–35)
AST: 21 U/L (ref 0–37)
Albumin: 4.6 g/dL (ref 3.5–5.2)
Alkaline Phosphatase: 104 U/L (ref 39–117)
BUN: 9 mg/dL (ref 6–23)
CO2: 25 mEq/L (ref 19–32)
Calcium: 9.2 mg/dL (ref 8.4–10.5)
Chloride: 103 mEq/L (ref 96–112)
Creat: 0.51 mg/dL (ref 0.50–1.10)
Glucose, Bld: 91 mg/dL (ref 70–99)
Potassium: 3.9 mEq/L (ref 3.5–5.3)
Sodium: 138 mEq/L (ref 135–145)
Total Bilirubin: 0.5 mg/dL (ref 0.2–1.2)
Total Protein: 8.3 g/dL (ref 6.0–8.3)

## 2013-06-17 LAB — POCT RAPID STREP A (OFFICE): Rapid Strep A Screen: NEGATIVE

## 2013-06-17 LAB — POCT SEDIMENTATION RATE: POCT SED RATE: 70 mm/hr — AB (ref 0–22)

## 2013-06-17 LAB — POCT INFLUENZA A/B
Influenza A, POC: NEGATIVE
Influenza B, POC: NEGATIVE

## 2013-06-17 LAB — RHEUMATOID FACTOR: Rhuematoid fact SerPl-aCnc: <10 IU/mL (ref <=14)

## 2013-06-17 LAB — URIC ACID: Uric Acid, Serum: 4.9 mg/dL (ref 2.4–7.0)

## 2013-06-17 MED ORDER — HYDROCODONE-HOMATROPINE 5-1.5 MG/5ML PO SYRP: ORAL_SOLUTION | ORAL | Status: DC

## 2013-06-17 MED ORDER — PREDNISONE 20 MG PO TABS: ORAL_TABLET | ORAL | Status: DC

## 2013-06-17 MED ORDER — ALBUTEROL SULFATE HFA 108 (90 BASE) MCG/ACT IN AERS: 2.0000 | INHALATION_SPRAY | Freq: Four times a day (QID) | RESPIRATORY_TRACT | Status: DC | PRN

## 2013-06-17 NOTE — Progress Notes (Signed)
Subjective:    Patient ID: Kristen Davidson, female    DOB: September 10, 1990, 23 y.o.   MRN: [id number]  HPI Primary Physician: Lucilla EdinAUB, STEVE A, MD  Chief Complaint: URI and knee pain  HPI: 23 y.o. female with history below presents with 2 issues.  1) URI: 3-4 day history of nasal congestion, rhinorrhea, post nasal drip, ST, sinus pressure, otalgia, headache, muffled hearing, sneezing, and cough. Cough is not productive and worse at nighttime. Slight headache today.  Subjective fever and chills. Everyone at home with similar symptoms. Has tried ibuprofen. Last dose of antipyretic was the previous evening at 11 pm. She believes she got a flu vaccine this season.   2) Knee pain and bilateral finger pain: Notes a 3 month history of arthralgias along the left knee and and bilateral fingers, specifically fingers 3 and 4 of the bilateral hands. No known injury or trauma. She is only achy when it is gloomy or rainy outside. Otherwise she is asymptomatic. On rainy or gloomy days she notes fleeing achy in the morning then this resolves after a couple of minutes. Throughout the day she is asymptomatic. No rashes. She states her mother has "arthritis" and her dad has "gout." She is concerned she has one of these.     Past Medical History  Diagnosis Date  . Asthma   . Abnormal Pap smear   . Headache(784.0)      Home Meds: Prior to Admission medications   Medication Sig Start Date End Date Taking? Authorizing Provider  albuterol (PROVENTIL HFA;VENTOLIN HFA) 108 (90 BASE) MCG/ACT inhaler Inhale 2 puffs into the lungs every 6 (six) hours as needed for wheezing or shortness of breath.   Yes Historical Provider, MD    Allergies: No Known Allergies  History   Social History  . Marital Status: Single    Spouse Name: N/A    Number of Children: N/A  . Years of Education: N/A   Occupational History  . Not on file.   Social History Main Topics  . Smoking status: Never Smoker   . Smokeless tobacco:  Never Used  . Alcohol Use: Yes     Comment: rarely  . Drug Use: No  . Sexual Activity: Yes   Other Topics Concern  . Not on file   Social History Narrative  . No narrative on file     Review of Systems  Constitutional: Positive for fever, chills, appetite change and fatigue.       Pushing fluids when she can.   HENT: Positive for congestion, ear pain, hearing loss, postnasal drip, rhinorrhea, sinus pressure, sneezing and sore throat.   Respiratory: Positive for cough. Negative for shortness of breath and wheezing.        Cough is not productive. Cough is worse at nighttime.   Musculoskeletal: Positive for arthralgias.  Skin: Negative for rash.  Neurological: Positive for headaches.       Slight headache today.        Objective:   Physical Exam  Physical Exam: Blood pressure 122/70, pulse 111, temperature 98.4 F (36.9 C), temperature source Oral, resp. rate 17, height 5\' 7"  (1.702 m), weight 167 lb (75.751 kg), SpO2 96.00%., Body mass index is 26.15 kg/(m^2). General: Well developed, well nourished, in no acute distress. Head: Normocephalic, atraumatic, eyes without discharge, sclera non-icteric, nares are congested. Bilateral auditory canals clear, TM's are without perforation, pearly grey and translucent with reflective cone of light bilaterally. Oral cavity moist, posterior pharynx without exudate, erythema,  peritonsillar abscess, or post nasal drip. Uvula midline.  Neck: Supple. No thyromegaly. Full ROM. No lymphadenopathy. No nuchal rigidity.  Lungs: Clear bilaterally to auscultation without wheezes, rales, or rhonchi. Breathing is unlabored. Heart: RRR with S1 S2. No murmurs, rubs, or gallops appreciated. Msk:  Strength and tone normal for age. Extremities/Skin: Warm and dry. No clubbing or cyanosis. No edema. No rashes or suspicious lesions. Bilateral knees with FROM and 5/5 strength. No erythema, ecchymosis, or STS. She notes mild discomfort to palpation along the soft  tissues of the left knee.  Neuro: Alert and oriented X 3. Moves all extremities spontaneously. Gait is normal. CNII-XII grossly in tact. Psych:  Responds to questions appropriately with a normal affect.    Labs: Results for orders placed in visit on 06/17/13  POCT CBC      Result Value Ref Range   WBC 6.8  4.6 - 10.2 K/uL   Lymph, poc 1.7  0.6 - 3.4   POC LYMPH PERCENT 25.3  10 - 50 %L   MID (cbc) 0.9  0 - 0.9   POC MID % 13.9 (*) 0 - 12 %M   POC Granulocyte 4.1  2 - 6.9   Granulocyte percent 60.8  37 - 80 %G   RBC 4.38  4.04 - 5.48 M/uL   Hemoglobin 13.2  12.2 - 16.2 g/dL   HCT, POC 96.0  45.4 - 47.9 %   MCV 93.0  80 - 97 fL   MCH, POC 30.1  27 - 31.2 pg   MCHC 32.4  31.8 - 35.4 g/dL   RDW, POC 09.8     Platelet Count, POC 242  142 - 424 K/uL   MPV 9.5  0 - 99.8 fL  POCT INFLUENZA A/B      Result Value Ref Range   Influenza A, POC Negative     Influenza B, POC Negative    POCT RAPID STREP A (OFFICE)      Result Value Ref Range   Rapid Strep A Screen Negative  Negative   Throat culture, CMP, TSH, uric acid, and RF all pending     Assessment & Plan:  23 year old female with URI and arthralgias   1) URI -Prednisone 20 mg #12 3x2, 2x2, 1x2 no RF -Mucinex -Supportive care -Rest/fluids -RTC precautions  2) Arthralgias -Long standing issue -Prednisone per above -Await labs   Eula Listen, MHS, PA-C [facility] [address] [place], [place] [address] [phone] [facility] 06/17/2013 12:59 PM

## 2013-06-18 LAB — TSH: TSH: 0.665 u[IU]/mL (ref 0.350–4.500)

## 2013-06-19 LAB — CULTURE, GROUP A STREP: Organism ID, Bacteria: NORMAL

## 2013-12-09 ENCOUNTER — Other Ambulatory Visit: Payer: Self-pay | Admitting: Internal Medicine

## 2013-12-09 DIAGNOSIS — N6310 Unspecified lump in the right breast, unspecified quadrant: Secondary | ICD-10-CM

## 2013-12-17 ENCOUNTER — Other Ambulatory Visit: Payer: Self-pay | Admitting: Internal Medicine

## 2013-12-17 ENCOUNTER — Ambulatory Visit
Admission: RE | Admit: 2013-12-17 | Discharge: 2013-12-17 | Disposition: A | Discharge status: Home / Self Care | Payer: Private Health Insurance - Indemnity | Source: Ambulatory Visit | Attending: Internal Medicine | Admitting: Internal Medicine

## 2013-12-17 DIAGNOSIS — N6310 Unspecified lump in the right breast, unspecified quadrant: Secondary | ICD-10-CM

## 2013-12-17 DIAGNOSIS — N6320 Unspecified lump in the left breast, unspecified quadrant: Secondary | ICD-10-CM

## 2013-12-24 ENCOUNTER — Other Ambulatory Visit: Payer: 59

## 2014-01-12 IMAGING — US US ABDOMEN COMPLETE
1 series · 14 of 25 positions shown · non-contrast
Comparison: Abdominal radiographic series 08/19/2012.

CLINICAL DATA: 21-year-old female with abdominal pain.  Epigastric
pain.  N.p.o. since last night.

COMPLETE ABDOMINAL ULTRASOUND

[Series 1: us abdomen complete · 0.23mm/px · 14 of 81 slices shown]
[im 1/81]
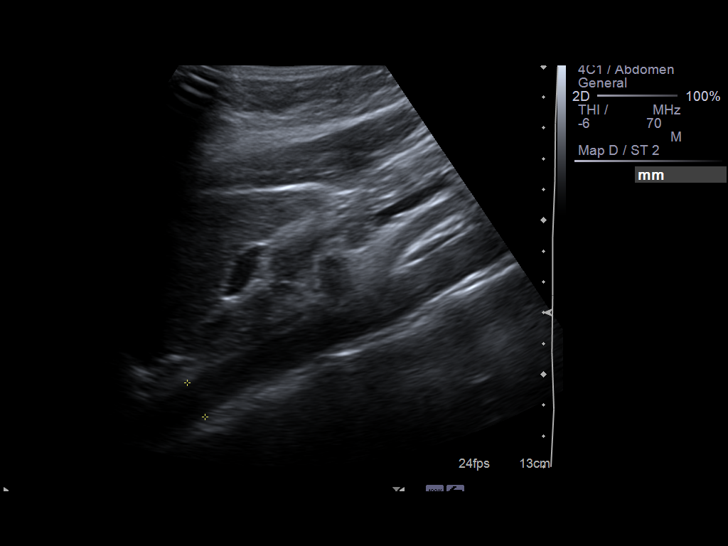
[im 7/81]
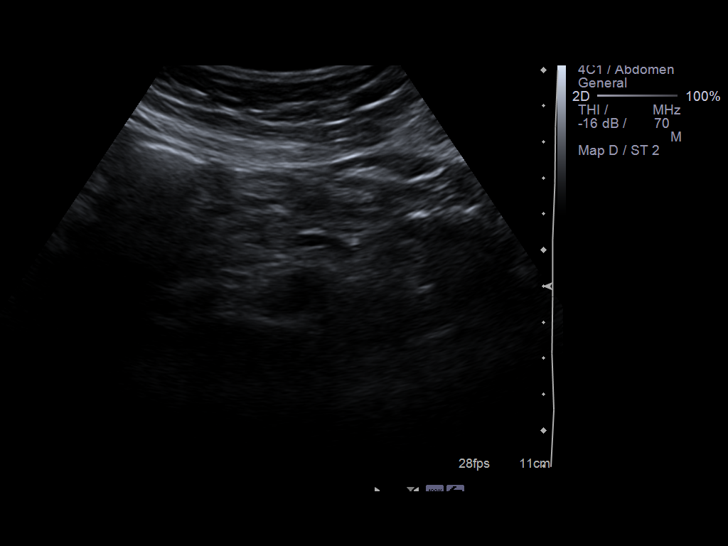
[im 14/81]
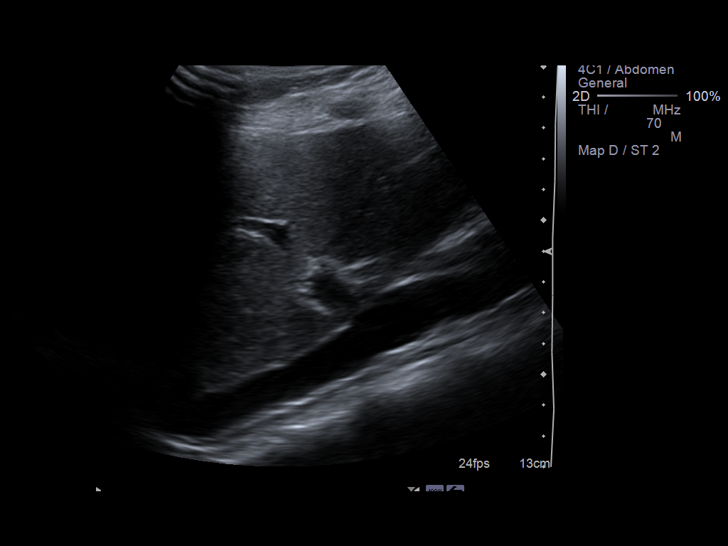
[im 21/81]
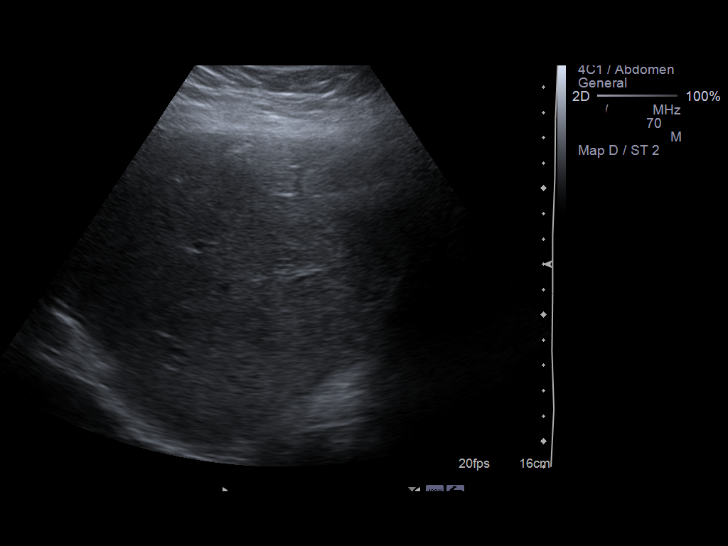
[im 27/81]
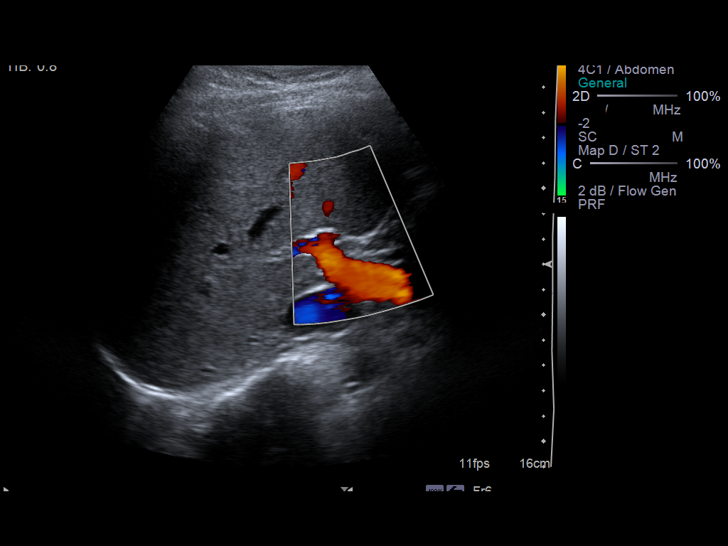
[im 31/81]
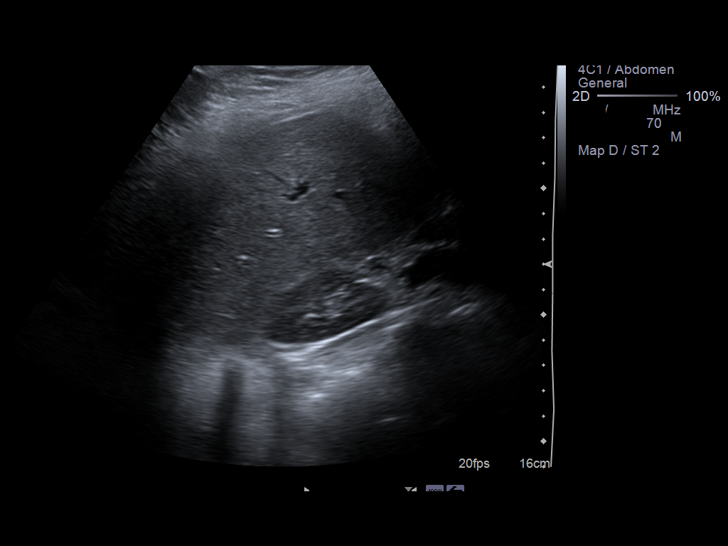
[im 37/81]
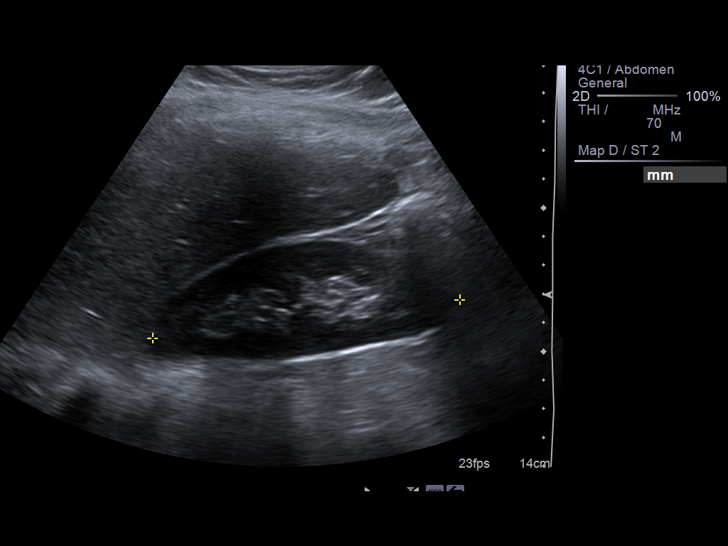
[im 44/81]
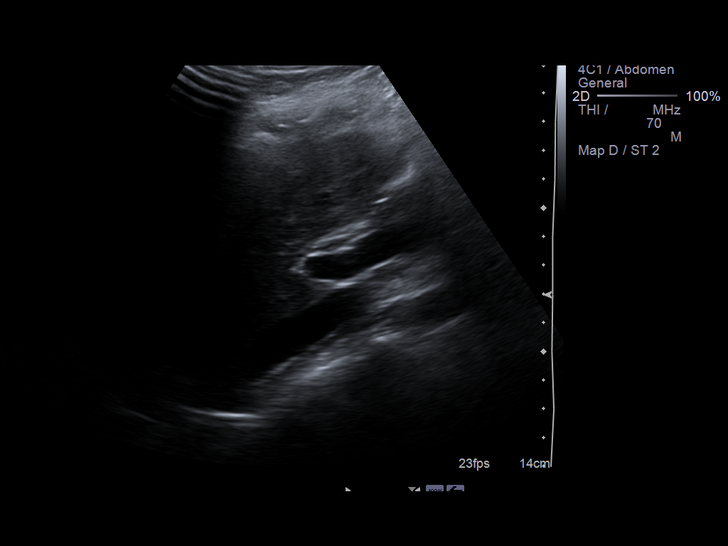
[im 51/81]
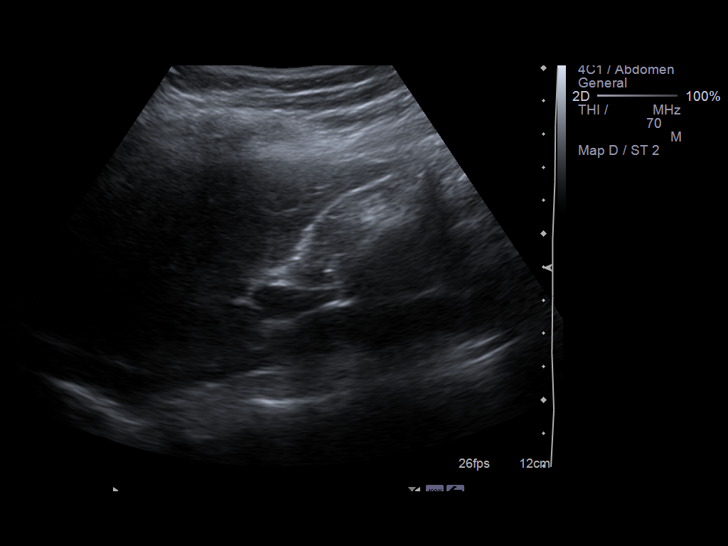
[im 54/81]
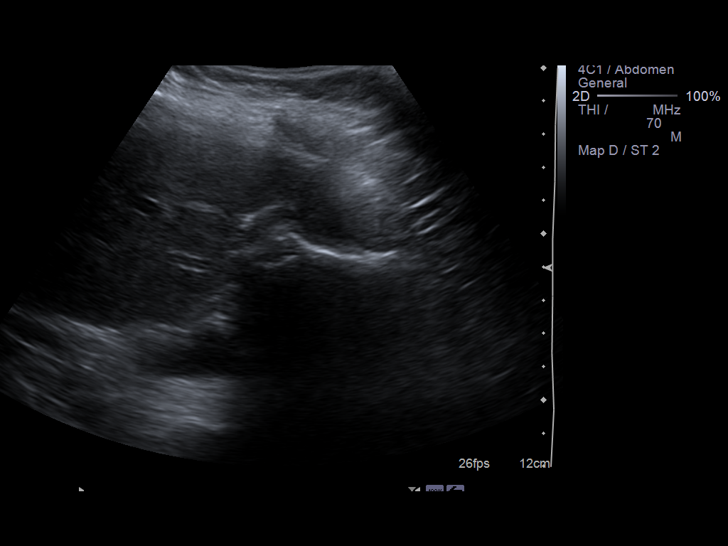
[im 61/81]
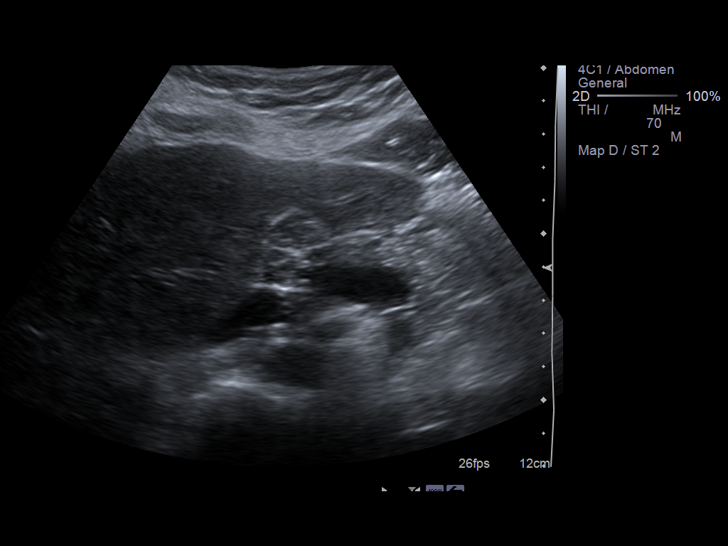
[im 67/81]
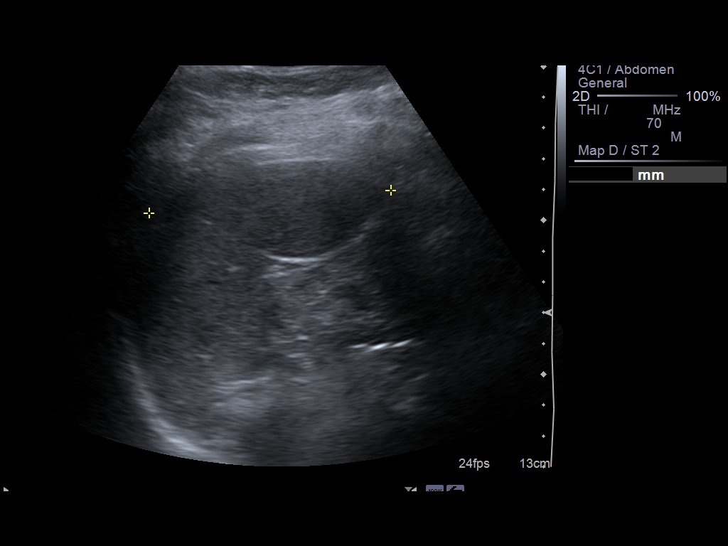
[im 74/81]
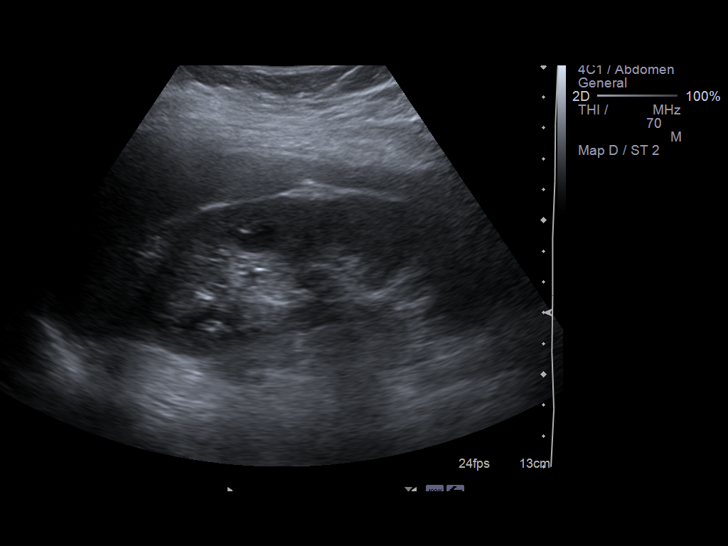
[im 81/81]
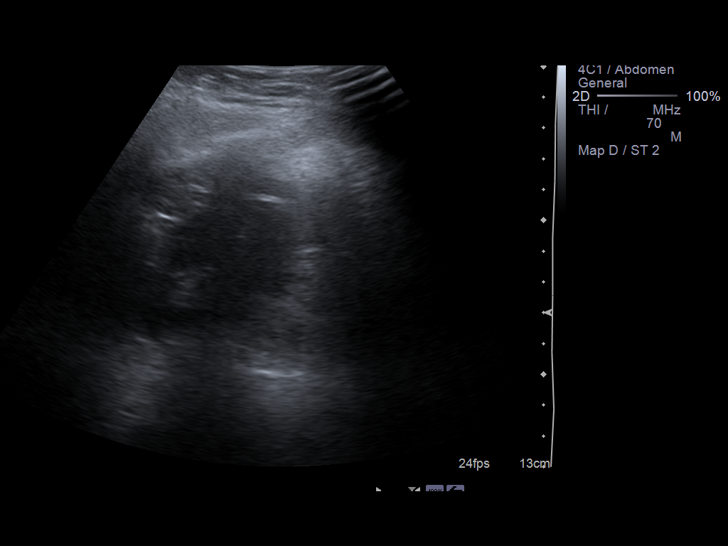

[14 of 25 positions shown; findings below may reference images not displayed]

FINDINGS: Gallbladder:  Contracted.  Some images (e.g. image 56) suggest a
"wall - echo - shadow" sign, suggesting the gallbladder is
collapsed around multiple stones.  Wall thickness remains normal up
to 2 mm.  No sonographic Murphy's sign elicited.  No
pericholecystic fluid.

Common bile duct:  Normal measuring 4 mm diameter.

Liver:  No focal lesion identified.  Within normal limits in
parenchymal echogenicity.

IVC:  Appears normal.

Pancreas:  No focal abnormality seen.

Spleen:  Normal measuring 7.9 cm in length.

Right Kidney:  Normal measuring 10.8 cm in length.

Left Kidney:  Normal measuring 11.5 cm in length.

Abdominal aorta:  No aneurysm identified.
IMPRESSION: 1.  Contracted gallbladder with evidence of cholelithiasis.  No
strong evidence of acute cholecystitis at this time.
2.  Otherwise normal sonographic appearance of the abdomen.

## 2014-01-31 ENCOUNTER — Encounter: Payer: Self-pay | Admitting: Physician Assistant

## 2014-07-23 ENCOUNTER — Ambulatory Visit (INDEPENDENT_AMBULATORY_CARE_PROVIDER_SITE_OTHER): Payer: Managed Care, Other (non HMO)

## 2014-07-23 ENCOUNTER — Ambulatory Visit (INDEPENDENT_AMBULATORY_CARE_PROVIDER_SITE_OTHER): Payer: Managed Care, Other (non HMO) | Admitting: Emergency Medicine

## 2014-07-23 VITALS — BP 102/66 | HR 99 | Temp 97.8°F | Resp 18 | Ht 68.0 in | Wt 164.0 lb

## 2014-07-23 DIAGNOSIS — R0981 Nasal congestion: Secondary | ICD-10-CM | POA: Diagnosis not present

## 2014-07-23 DIAGNOSIS — R0602 Shortness of breath: Secondary | ICD-10-CM

## 2014-07-23 DIAGNOSIS — R079 Chest pain, unspecified: Secondary | ICD-10-CM | POA: Diagnosis not present

## 2014-07-23 DIAGNOSIS — J329 Chronic sinusitis, unspecified: Secondary | ICD-10-CM | POA: Diagnosis not present

## 2014-07-23 DIAGNOSIS — J4530 Mild persistent asthma, uncomplicated: Secondary | ICD-10-CM

## 2014-07-23 DIAGNOSIS — R0789 Other chest pain: Secondary | ICD-10-CM | POA: Diagnosis not present

## 2014-07-23 DIAGNOSIS — R059 Cough, unspecified: Secondary | ICD-10-CM

## 2014-07-23 DIAGNOSIS — R1031 Right lower quadrant pain: Secondary | ICD-10-CM

## 2014-07-23 DIAGNOSIS — R42 Dizziness and giddiness: Secondary | ICD-10-CM | POA: Diagnosis not present

## 2014-07-23 DIAGNOSIS — R05 Cough: Secondary | ICD-10-CM | POA: Diagnosis not present

## 2014-07-23 DIAGNOSIS — J302 Other seasonal allergic rhinitis: Secondary | ICD-10-CM

## 2014-07-23 LAB — POCT CBC
Granulocyte percent: 61.7 %G (ref 37–80)
HCT, POC: 40.3 % (ref 37.7–47.9)
Hemoglobin: 13.4 g/dL (ref 12.2–16.2)
Lymph, poc: 2.6 (ref 0.6–3.4)
MCH, POC: 29 pg (ref 27–31.2)
MCHC: 33.2 g/dL (ref 31.8–35.4)
MCV: 87.3 fL (ref 80–97)
MID (cbc): 0.6 (ref 0–0.9)
MPV: 8.1 fL (ref 0–99.8)
POC Granulocyte: 5.1 (ref 2–6.9)
POC LYMPH PERCENT: 31.1 %L (ref 10–50)
POC MID %: 7.2 %M (ref 0–12)
Platelet Count, POC: 274 10*3/uL (ref 142–424)
RBC: 4.61 M/uL (ref 4.04–5.48)
RDW, POC: 12.6 %
WBC: 8.2 10*3/uL (ref 4.6–10.2)

## 2014-07-23 LAB — COMPREHENSIVE METABOLIC PANEL
ALT: 27 U/L (ref 0–35)
AST: 23 U/L (ref 0–37)
Albumin: 4.3 g/dL (ref 3.5–5.2)
Alkaline Phosphatase: 93 U/L (ref 39–117)
BUN: 12 mg/dL (ref 6–23)
CO2: 28 mEq/L (ref 19–32)
Calcium: 9.5 mg/dL (ref 8.4–10.5)
Chloride: 99 mEq/L (ref 96–112)
Creat: 0.67 mg/dL (ref 0.50–1.10)
Glucose, Bld: 78 mg/dL (ref 70–99)
Potassium: 3.9 mEq/L (ref 3.5–5.3)
Sodium: 138 mEq/L (ref 135–145)
Total Bilirubin: 0.4 mg/dL (ref 0.2–1.2)
Total Protein: 8.2 g/dL (ref 6.0–8.3)

## 2014-07-23 LAB — POCT UA - MICROSCOPIC ONLY
Casts, Ur, LPF, POC: NEGATIVE
Crystals, Ur, HPF, POC: NEGATIVE
Yeast, UA: NEGATIVE

## 2014-07-23 LAB — POCT URINALYSIS DIPSTICK
Bilirubin, UA: NEGATIVE
Glucose, UA: NEGATIVE
Ketones, UA: NEGATIVE
Leukocytes, UA: NEGATIVE
Nitrite, UA: NEGATIVE
Protein, UA: NEGATIVE
Spec Grav, UA: 1.02
Urobilinogen, UA: 0.2
pH, UA: 6

## 2014-07-23 MED ORDER — BECLOMETHASONE DIPROPIONATE 40 MCG/ACT IN AERS: 1.0000 | INHALATION_SPRAY | Freq: Two times a day (BID) | RESPIRATORY_TRACT | Status: DC

## 2014-07-23 MED ORDER — FLUTICASONE PROPIONATE 50 MCG/ACT NA SUSP: 2.0000 | Freq: Every day | NASAL | Status: DC

## 2014-07-23 MED ORDER — CETIRIZINE HCL 10 MG PO TABS: 10.0000 mg | ORAL_TABLET | Freq: Every day | ORAL | Status: DC

## 2014-07-23 MED ORDER — FEXOFENADINE HCL 60 MG PO TABS: 60.0000 mg | ORAL_TABLET | Freq: Two times a day (BID) | ORAL | Status: DC

## 2014-07-23 MED ORDER — PSEUDOEPHEDRINE HCL ER 120 MG PO TB12: 120.0000 mg | ORAL_TABLET | Freq: Two times a day (BID) | ORAL | Status: DC

## 2014-07-23 MED ORDER — DOXYCYCLINE HYCLATE 100 MG PO CAPS: 100.0000 mg | ORAL_CAPSULE | Freq: Two times a day (BID) | ORAL | Status: DC

## 2014-07-23 MED ORDER — ALBUTEROL SULFATE HFA 108 (90 BASE) MCG/ACT IN AERS: 2.0000 | INHALATION_SPRAY | Freq: Four times a day (QID) | RESPIRATORY_TRACT | Status: DC | PRN

## 2014-07-23 NOTE — Patient Instructions (Addendum)
Sinusitis Sinusitis is redness, soreness, and inflammation of the paranasal sinuses. Paranasal sinuses are air pockets within the bones of your face (beneath the eyes, the middle of the forehead, or above the eyes). In healthy paranasal sinuses, mucus is able to drain out, and air is able to circulate through them by way of your nose. However, when your paranasal sinuses are inflamed, mucus and air can become trapped. This can allow bacteria and other germs to grow and cause infection. Sinusitis can develop quickly and last only a short time (acute) or continue over a long period (chronic). Sinusitis that lasts for more than 12 weeks is considered chronic.  CAUSES  Causes of sinusitis include:  Allergies.  Structural abnormalities, such as displacement of the cartilage that separates your nostrils (deviated septum), which can decrease the air flow through your nose and sinuses and affect sinus drainage.  Functional abnormalities, such as when the small hairs (cilia) that line your sinuses and help remove mucus do not work properly or are not present. SIGNS AND SYMPTOMS  Symptoms of acute and chronic sinusitis are the same. The primary symptoms are pain and pressure around the affected sinuses. Other symptoms include:  Upper toothache.  Earache.  Headache.  Bad breath.  Decreased sense of smell and taste.  A cough, which worsens when you are lying flat.  Fatigue.  Fever.  Thick drainage from your nose, which often is green and may contain pus (purulent).  Swelling and warmth over the affected sinuses. DIAGNOSIS  Your health care provider will perform a physical exam. During the exam, your health care provider may:  Look in your nose for signs of abnormal growths in your nostrils (nasal polyps).  Tap over the affected sinus to check for signs of infection.  View the inside of your sinuses (endoscopy) using an imaging device that has a light attached (endoscope). If your health  care provider suspects that you have chronic sinusitis, one or more of the following tests may be recommended:  Allergy tests.  Nasal culture. A sample of mucus is taken from your nose, sent to a lab, and screened for bacteria.  Nasal cytology. A sample of mucus is taken from your nose and examined by your health care provider to determine if your sinusitis is related to an allergy. TREATMENT  Most cases of acute sinusitis are related to a viral infection and will resolve on their own within 10 days. Sometimes medicines are prescribed to help relieve symptoms (pain medicine, decongestants, nasal steroid sprays, or saline sprays).  However, for sinusitis related to a bacterial infection, your health care provider will prescribe antibiotic medicines. These are medicines that will help kill the bacteria causing the infection.  Rarely, sinusitis is caused by a fungal infection. In theses cases, your health care provider will prescribe antifungal medicine. For some cases of chronic sinusitis, surgery is needed. Generally, these are cases in which sinusitis recurs more than 3 times per year, despite other treatments. HOME CARE INSTRUCTIONS   Drink plenty of water. Water helps thin the mucus so your sinuses can drain more easily.  Use a humidifier.  Inhale steam 3 to 4 times a day (for example, sit in the bathroom with the shower running).  Apply a warm, moist washcloth to your face 3 to 4 times a day, or as directed by your health care provider.  Use saline nasal sprays to help moisten and clean your sinuses.  Take medicines only as directed by your health care provider.    If you were prescribed either an antibiotic or antifungal medicine, finish it all even if you start to feel better. SEEK IMMEDIATE MEDICAL CARE IF:  You have increasing pain or severe headaches.  You have nausea, vomiting, or drowsiness.  You have swelling around your face.  You have vision problems.  You have a stiff  neck.  You have difficulty breathing. MAKE SURE YOU:   Understand these instructions.  Will watch your condition.  Will get help right away if you are not doing well or get worse. Document Released: 03/18/2005 Document Revised: 08/02/2013 Document Reviewed: 04/02/2011 [facility] Patient Information 2015 [place], [place]. This information is not intended to replace advice given to you by your health care provider. Make sure you discuss any questions you have with your health care provider.    Allergies Allergies may happen from anything your body is sensitive to. This may be food, medicines, pollens, chemicals, and nearly anything around you in everyday life that produces allergens. An allergen is anything that causes an allergy producing substance. Heredity is often a factor in causing these problems. This means you may have some of the same allergies as your parents. Food allergies happen in all age groups. Food allergies are some of the most severe and life threatening. Some common food allergies are cow's milk, seafood, eggs, nuts, wheat, and soybeans. SYMPTOMS   Swelling around the mouth.  An itchy red rash or hives.  Vomiting or diarrhea.  Difficulty breathing. SEVERE ALLERGIC REACTIONS ARE LIFE-THREATENING. This reaction is called anaphylaxis. It can cause the mouth and throat to swell and cause difficulty with breathing and swallowing. In severe reactions only a trace amount of food (for example, peanut oil in a salad) may cause death within seconds. Seasonal allergies occur in all age groups. These are seasonal because they usually occur during the same season every year. They may be a reaction to molds, grass pollens, or tree pollens. Other causes of problems are house dust mite allergens, pet dander, and mold spores. The symptoms often consist of nasal congestion, a runny itchy nose associated with sneezing, and tearing itchy eyes. There is often an associated itching of the mouth  and ears. The problems happen when you come in contact with pollens and other allergens. Allergens are the particles in the air that the body reacts to with an allergic reaction. This causes you to release allergic antibodies. Through a chain of events, these eventually cause you to release histamine into the blood stream. Although it is meant to be protective to the body, it is this release that causes your discomfort. This is why you were given anti-histamines to feel better. If you are unable to pinpoint the offending allergen, it may be determined by skin or blood testing. Allergies cannot be cured but can be controlled with medicine. Hay fever is a collection of all or some of the seasonal allergy problems. It may often be treated with simple over-the-counter medicine such as diphenhydramine. Take medicine as directed. Do not drink alcohol or drive while taking this medicine. Check with your caregiver or package insert for child dosages. If these medicines are not effective, there are many new medicines your caregiver can prescribe. Stronger medicine such as nasal spray, eye drops, and corticosteroids may be used if the first things you try do not work well. Other treatments such as immunotherapy or desensitizing injections can be used if all else fails. Follow up with your caregiver if problems continue. These seasonal allergies are usually not  life threatening. They are generally more of a nuisance that can often be handled using medicine. HOME CARE INSTRUCTIONS   If unsure what causes a reaction, keep a diary of foods eaten and symptoms that follow. Avoid foods that cause reactions.  If hives or rash are present:  Take medicine as directed.  You may use an over-the-counter antihistamine (diphenhydramine) for hives and itching as needed.  Apply cold compresses (cloths) to the skin or take baths in cool water. Avoid hot baths or showers. Heat will make a rash and itching worse.  If you are  severely allergic:  Following a treatment for a severe reaction, hospitalization is often required for closer follow-up.  Wear a medic-alert bracelet or necklace stating the allergy.  You and your family must learn how to give adrenaline or use an anaphylaxis kit.  If you have had a severe reaction, always carry your anaphylaxis kit or EpiPen with you. Use this medicine as directed by your caregiver if a severe reaction is occurring. Failure to do so could have a fatal outcome. SEEK MEDICAL CARE IF:  You suspect a food allergy. Symptoms generally happen within 30 minutes of eating a food.  Your symptoms have not gone away within 2 days or are getting worse.  You develop new symptoms.  You want to retest yourself or your child with a food or drink you think causes an allergic reaction. Never do this if an anaphylactic reaction to that food or drink has happened before. Only do this under the care of a caregiver. SEEK IMMEDIATE MEDICAL CARE IF:   You have difficulty breathing, are wheezing, or have a tight feeling in your chest or throat.  You have a swollen mouth, or you have hives, swelling, or itching all over your body.  You have had a severe reaction that has responded to your anaphylaxis kit or an EpiPen. These reactions may return when the medicine has worn off. These reactions should be considered life threatening. MAKE SURE YOU:   Understand these instructions.  Will watch your condition.  Will get help right away if you are not doing well or get worse. Document Released: 06/11/2002 Document Revised: 07/13/2012 Document Reviewed: 11/16/2007 [facility] Patient Information 2015 [place], [place]. This information is not intended to replace advice given to you by your health care provider. Make sure you discuss any questions you have with your health care provider.    Asthma Asthma is a recurring condition in which the airways tighten and narrow. Asthma can make it difficult  to breathe. It can cause coughing, wheezing, and shortness of breath. Asthma episodes, also called asthma attacks, range from minor to life-threatening. Asthma cannot be cured, but medicines and lifestyle changes can help control it. CAUSES Asthma is believed to be caused by inherited (genetic) and environmental factors, but its exact cause is unknown. Asthma may be triggered by allergens, lung infections, or irritants in the air. Asthma triggers are different for each person. Common triggers include:   Animal dander.  Dust mites.  Cockroaches.  Pollen from trees or grass.  Mold.  Smoke.  Air pollutants such as dust, household cleaners, hair sprays, aerosol sprays, paint fumes, strong chemicals, or strong odors.  Cold air, weather changes, and winds (which increase molds and pollens in the air).  Strong emotional expressions such as crying or laughing hard.  Stress.  Certain medicines (such as aspirin) or types of drugs (such as beta-blockers).  Sulfites in foods and drinks. Foods and drinks that may  contain sulfites include dried fruit, potato chips, and sparkling grape juice.  Infections or inflammatory conditions such as the flu, a cold, or an inflammation of the nasal membranes (rhinitis).  Gastroesophageal reflux disease (GERD).  Exercise or strenuous activity. SYMPTOMS Symptoms may occur immediately after asthma is triggered or many hours later. Symptoms include:  Wheezing.  Excessive nighttime or early morning coughing.  Frequent or severe coughing with a common cold.  Chest tightness.  Shortness of breath. DIAGNOSIS  The diagnosis of asthma is made by a review of your medical history and a physical exam. Tests may also be performed. These may include:  Lung function studies. These tests show how much air you breathe in and out.  Allergy tests.  Imaging tests such as X-rays. TREATMENT  Asthma cannot be cured, but it can usually be controlled. Treatment  involves identifying and avoiding your asthma triggers. It also involves medicines. There are 2 classes of medicine used for asthma treatment:   Controller medicines. These prevent asthma symptoms from occurring. They are usually taken every day.  Reliever or rescue medicines. These quickly relieve asthma symptoms. They are used as needed and provide short-term relief. Your health care provider will help you create an asthma action plan. An asthma action plan is a written plan for managing and treating your asthma attacks. It includes a list of your asthma triggers and how they may be avoided. It also includes information on when medicines should be taken and when their dosage should be changed. An action plan may also involve the use of a device called a peak flow meter. A peak flow meter measures how well the lungs are working. It helps you monitor your condition. HOME CARE INSTRUCTIONS   Take medicines only as directed by your health care provider. Speak with your health care provider if you have questions about how or when to take the medicines.  Use a peak flow meter as directed by your health care provider. Record and keep track of readings.  Understand and use the action plan to help minimize or stop an asthma attack without needing to seek medical care.  Control your home environment in the following ways to help prevent asthma attacks:  Do not smoke. Avoid being exposed to secondhand smoke.  Change your heating and air conditioning filter regularly.  Limit your use of fireplaces and wood stoves.  Get rid of pests (such as roaches and mice) and their droppings.  Throw away plants if you see mold on them.  Clean your floors and dust regularly. Use unscented cleaning products.  Try to have someone else vacuum for you regularly. Stay out of rooms while they are being vacuumed and for a short while afterward. If you vacuum, use a dust mask from a hardware store, a double-layered or  microfilter vacuum cleaner bag, or a vacuum cleaner with a HEPA filter.  Replace carpet with wood, tile, or vinyl flooring. Carpet can trap dander and dust.  Use allergy-proof pillows, mattress covers, and box spring covers.  Wash bed sheets and blankets every week in hot water and dry them in a dryer.  Use blankets that are made of polyester or cotton.  Clean bathrooms and kitchens with bleach. If possible, have someone repaint the walls in these rooms with mold-resistant paint. Keep out of the rooms that are being cleaned and painted.  Wash hands frequently. SEEK MEDICAL CARE IF:   You have wheezing, shortness of breath, or a cough even if taking medicine to  prevent attacks.  The colored mucus you cough up (sputum) is thicker than usual.  Your sputum changes from clear or white to yellow, green, gray, or bloody.  You have any problems that may be related to the medicines you are taking (such as a rash, itching, swelling, or trouble breathing).  You are using a reliever medicine more than 2-3 times per week.  Your peak flow is still at 50-79% of your personal best after following your action plan for 1 hour.  You have a fever. SEEK IMMEDIATE MEDICAL CARE IF:   You seem to be getting worse and are unresponsive to treatment during an asthma attack.  You are short of breath even at rest.  You get short of breath when doing very little physical activity.  You have difficulty eating, drinking, or talking due to asthma symptoms.  You develop chest pain.  You develop a fast heartbeat.  You have a bluish color to your lips or fingernails.  You are light-headed, dizzy, or faint.  Your peak flow is less than 50% of your personal best. MAKE SURE YOU:   Understand these instructions.  Will watch your condition.  Will get help right away if you are not doing well or get worse. Document Released: 03/18/2005 Document Revised: 08/02/2013 Document Reviewed: 10/15/2012 [facility]  Patient Information 2015 [place], [place]. This information is not intended to replace advice given to you by your health care provider. Make sure you discuss any questions you have with your health care provider.

## 2014-07-23 NOTE — Progress Notes (Signed)
MRN: [id number] DOB: 1990/10/03  Subjective:   Kristen Davidson is a 24 y.o. female presenting for chief complaint of Chest Pain; Dizziness; and Nasal Congestion  Reports 1.5 month history of severe nasal congestion, sinus pressure, sinus headache, ear fullness, sneezing, mild occasional cough intermittently productive but without hemoptysis. Yesterday, patient developed intermittent, achy, right-sided chest pain that sometimes radiates to her right shoulder blade, chills, night-sweats, nausea. Of note, patient has had cholecystectomy and tonsillectomy. Additionally, patient has had shob, wheezing 2-4 days out of the week for ~2 months, night time wheezing. She has tried oral antihistamines intermittently, uses albuterol intermittently as well for her asthma, has a nebulizer at home but has not used this. Denies any other aggravating or relieving factors, no other questions or concerns.  Kristen Davidson has a current medication list which includes the following prescription(s): albuterol. She has No Known Allergies.  Kristen Davidson  has a past medical history of Asthma; Abnormal Pap smear; Headache(784.0); and Allergy. Also  has past surgical history that includes Tonsillectomy; Wisdom tooth extraction; Colposcopy; and Cholecystectomy (N/A, 10/09/2012).  ROS As in subjective.  Objective:   Vitals: BP 102/66 mmHg  Pulse 99  Temp(Src) 97.8 F (36.6 C)  Resp 18  Ht 5\' 8"  (1.727 m)  Wt 164 lb (74.39 kg)  BMI 24.94 kg/m2  SpO2 99%  LMP 07/23/2014  Physical Exam  Constitutional: She is oriented to person, place, and time and well-developed, well-nourished, and in no distress.  HENT:  TM's flat bilaterally, no effusions or erythema. Nasal mucosa severely edematous and erythematous, nasal turbinates not visualized, copious amounts of thick purulent mucus. Bilateral maxillary sinus tenderness. Significant postnasal drip present, without oropharyngeal exudates, erythema or abscesses.  Eyes: Conjunctivae are  normal. Right eye exhibits no discharge. Left eye exhibits no discharge. No scleral icterus.  Neck: Normal range of motion.  Cardiovascular: Normal rate and regular rhythm.  Exam reveals no gallop and no friction rub.   No murmur heard. Pulmonary/Chest: No stridor. No respiratory distress. She has wheezes (bibasilar). She has no rales. She exhibits no tenderness.  Abdominal: Soft. Bowel sounds are normal. She exhibits no distension and no mass. There is tenderness (RLQ).  Genitourinary:  Patient declined pelvic exam, states that she has an OB/GYN, has had abnormal pap smears in the past which typically have ended up being normal with further analysis.  Lymphadenopathy:    She has no cervical adenopathy.  Neurological: She is alert and oriented to person, place, and time.  Skin: Skin is warm and dry. No rash noted. No erythema. No pallor.   [facility] reading (PRIMARY) by  Dr. Cleta Albertsaub and PA-Esther Broyles. Chest: no evidence of infiltrate or consolidation.  Dg Chest 2 View  07/23/2014   CLINICAL DATA:  Chest pain.  Short of breath.  Wheezing.  EXAM: CHEST  2 VIEW  COMPARISON:  08/19/2012.  FINDINGS: Cardiopericardial silhouette within normal limits. Mediastinal contours normal. Trachea midline. No airspace disease or effusion. Cholecystectomy clips are present in the right upper quadrant.  IMPRESSION: No active cardiopulmonary disease.   Electronically Signed   By: Andreas NewportGeoffrey  Lamke M.D.   On: 07/23/2014 13:51   Results for orders placed or performed in visit on 07/23/14 (from the past 24 hour(s))  POCT CBC     Status: None   Collection Time: 07/23/14 12:44 PM  Result Value Ref Range   WBC 8.2 4.6 - 10.2 K/uL   Lymph, poc 2.6 0.6 - 3.4   POC LYMPH PERCENT 31.1 10 - 50 %  L   MID (cbc) 0.6 0 - 0.9   POC MID % 7.2 0 - 12 %M   POC Granulocyte 5.1 2 - 6.9   Granulocyte percent 61.7 37 - 80 %G   RBC 4.61 4.04 - 5.48 M/uL   Hemoglobin 13.4 12.2 - 16.2 g/dL   HCT, POC 16.1 09.6 - 47.9 %   MCV 87.3 80 - 97 fL    MCH, POC 29.0 27 - 31.2 pg   MCHC 33.2 31.8 - 35.4 g/dL   RDW, POC 04.5 %   Platelet Count, POC 274 142 - 424 K/uL   MPV 8.1 0 - 99.8 fL  POCT urinalysis dipstick     Status: None   Collection Time: 07/23/14 12:44 PM  Result Value Ref Range   Color, UA yellow    Clarity, UA cloudy    Glucose, UA neg    Bilirubin, UA neg    Ketones, UA neg    Spec Grav, UA 1.020    Blood, UA large    pH, UA 6.0    Protein, UA neg    Urobilinogen, UA 0.2    Nitrite, UA neg    Leukocytes, UA Negative   POCT UA - Microscopic Only     Status: None   Collection Time: 07/23/14 12:44 PM  Result Value Ref Range   WBC, Ur, HPF, POC 1-3    RBC, urine, microscopic TNTC    Bacteria, U Microscopic 1+    Mucus, UA 1+    Epithelial cells, urine per micros 2-3    Crystals, Ur, HPF, POC neg    Casts, Ur, LPF, POC neg    Yeast, UA neg     Assessment and Plan :   1. Atypical chest pain 2. Dizziness - Discussed differential including abnormal heart rhythm versus uncontrolled asthma versus dehydration. Patient admits family history of heart disease and diabetes, mother has PVC's but denies family history of atrial fibrillation - Will control asthma, advised adequate hydration which patient admits she does not do well, eat balanced and healthy diet - Recheck in 1 month  3. RLQ abdominal pain - Likely related to global inflammation and worsened by current menstrual cycle - Low likelihood of appendicitis, STI and PID should be considered if patient's pain does not resolve despite patient's insistence on monogamous relationship, patient declined STI testing today.  4. Shortness of breath 5. Sinusitis, unspecified chronicity, unspecified location 6. Mild persistent asthma, uncomplicated 7. Nasal congestion 8. Seasonal allergies 9. Cough - Start doxycycline given physical exam findings and longstanding uncontrolled allergic rhinitis - Advised patient should start inhaled steroid daily for asthma, albuterol  inhaler as rescue therapy/scheduled 2-3 albuterol inhalations for 1 week; recheck in 1 month - Start allergy medicine daily through the end of May - If no improvement return to clinic in 10 days or sooner if significantly worse symptoms as discussed in clinic - Will call results to [phone], patient states voicemail with results is okay.  Wallis Bamberg, PA-C [facility] [phone] 07/23/2014 12:02 PM

## 2014-07-28 NOTE — Progress Notes (Signed)
Patient is to see Dr Cleta Albertsaub for a follow up appt on 09/06/14 at 1:00

## 2014-09-06 ENCOUNTER — Ambulatory Visit: Payer: Self-pay | Admitting: Emergency Medicine

## 2014-10-06 ENCOUNTER — Other Ambulatory Visit: Payer: Self-pay

## 2014-10-10 LAB — CYTOLOGY - PAP

## 2015-01-03 ENCOUNTER — Encounter: Payer: Self-pay | Admitting: Emergency Medicine

## 2015-02-06 ENCOUNTER — Ambulatory Visit (INDEPENDENT_AMBULATORY_CARE_PROVIDER_SITE_OTHER): Payer: Managed Care, Other (non HMO) | Admitting: Internal Medicine

## 2015-02-06 VITALS — BP 116/72 | HR 98 | Temp 98.0°F | Resp 20 | Ht 65.5 in | Wt 175.1 lb

## 2015-02-06 DIAGNOSIS — G4489 Other headache syndrome: Secondary | ICD-10-CM | POA: Diagnosis not present

## 2015-02-06 DIAGNOSIS — R110 Nausea: Secondary | ICD-10-CM | POA: Diagnosis not present

## 2015-02-06 MED ORDER — ONDANSETRON 4 MG PO TBDP: 4.0000 mg | ORAL_TABLET | Freq: Three times a day (TID) | ORAL | Status: DC | PRN

## 2015-02-06 MED ORDER — ONDANSETRON 4 MG PO TBDP
8.0000 mg | ORAL_TABLET | Freq: Once | ORAL | Status: AC
  Administered 2015-02-06: 8 mg via ORAL

## 2015-02-06 MED ORDER — BUTALBITAL-APAP-CAFFEINE 50-325-40 MG PO TABS: 1.0000 | ORAL_TABLET | Freq: Two times a day (BID) | ORAL | Status: DC | PRN

## 2015-02-06 NOTE — Progress Notes (Signed)
Subjective:  This chart was scribed for Kristen Siaobert Doolittle, MD by Stann Oresung-Kai Tsai, Medical Scribe. This patient was seen in Room 8 and the patient's care was started at 7:22 PM.    Patient ID: Kristen Davidson, female    DOB: December 21, 1990, 24 y.o.   MRN: [id number] Chief Complaint  Patient presents with  . Headache    several months and dizziness with nausea  . Diarrhea    only on friday.      HPI Kristen Davidson is a 24 y.o. female who [facility] complaining of headaches and nausea. At first she describes this starting as one episode of diarrhea 3 d ago with nausea -no vomiting ever since. Her daughter then got N,V,D the next and still has fever today. Then she relates a longer hx of HAs. Mainly  in the back assoc with with dizziness and nausea for about 2 months. Could be am or pm without clear ppting factors. No vision trouble. No gait problems. Makes computer work difficult. She's been able to hold fluids down. No trouble with appetite. She has had some rhinorrhea.  She denies sore throat, cough.  Mom recent;ly hospitalized for intractable migraines  She's been on birth control (tri-previfem) for years prior to the baby without complications. But, after giving birth to her daughter, she's resumed the birth control and noticed nausea intermittently over last 3 yrs.  She has not taken any medication for this.   Her daughter is sick as well with vomiting and unable to keep fluids down. There's been something going around at her daycare.    Patient Active Problem List   Diagnosis Date Noted  . H. pylori infection 08/18/2012  . Chronic back pain 08/18/2012    Current outpatient prescriptions:   .  TRI-PREVIFEM 0.18/0.215/0.25 MG-35 MCG tablet, Take 1 tablet by mouth daily., Disp: , Rfl: 3  probs from April OV have resolved     .Review of Systems  Constitutional: Positive for fatigue. Negative for fever, chills and unexpected weight change.  HENT: Positive for rhinorrhea.  Negative for sinus pressure, sore throat and trouble swallowing.   Eyes: Negative for photophobia and visual disturbance.  Respiratory: Negative for cough, choking, chest tightness and shortness of breath.   Cardiovascular: Negative for chest pain and palpitations.  Gastrointestinal: Positive for nausea and diarrhea. Negative for vomiting, abdominal pain and constipation.  Genitourinary: Negative for dysuria and difficulty urinating.  Skin: Negative for rash and wound.  Neurological: Positive for dizziness and headaches. Negative for speech difficulty, weakness and numbness.  Psychiatric/Behavioral: Negative for confusion and decreased concentration.       Objective:   Physical Exam  Constitutional: She is oriented to person, place, and time. She appears well-developed and well-nourished. No distress.  Obviously uncomfortable  HENT:  Head: Normocephalic and atraumatic.  Right Ear: External ear normal.  Left Ear: External ear normal.  Mouth/Throat: Oropharynx is clear and moist.  Clear rhinorrhea  Eyes: Conjunctivae and EOM are normal. Pupils are equal, round, and reactive to light.  Neck: No thyromegaly present.  Tender in paracerv muscles bilat but no nuchal rigidity  Cardiovascular: Normal rate.   Pulmonary/Chest: Effort normal. No respiratory distress.  Musculoskeletal: Normal range of motion.  Lymphadenopathy:    She has no cervical adenopathy.  Neurological: She is alert and oriented to person, place, and time. No cranial nerve deficit. She exhibits normal muscle tone. Coordination normal.  Skin: Skin is warm and dry.  Psychiatric: She has a normal mood  and affect. Her behavior is normal. Thought content normal.  Nursing note and vitals reviewed.   BP 116/72 mmHg  Pulse 98  Temp(Src) 98 F (36.7 C) (Oral)  Resp 20  Ht 5' 5.5" (1.664 m)  Wt 175 lb 2 oz (79.436 kg)  BMI 28.69 kg/m2  SpO2 99%  LMP 02/02/2015 Wt Readings from Last 3 Encounters:  02/06/15 175 lb 2 oz  (79.436 kg)  07/23/14 164 lb (74.39 kg)  06/17/13 167 lb (75.751 kg)   Vomited once--then later: 7:31 PM given zofran 8SL     Assessment & Plan:  Nausea without vomiting - Plan: ondansetron (ZOFRAN-ODT) disintegrating tablet 8 mg  Other headache syndrome  There are at least 2 things going on. She shares some of this viral GI illness with her daughter and needs N/V control plus fluids for next 1-2 days. She also has recent onset of frequent HAs that don't fit migraine type yet. Unclear if stress assoc as she can't identify PPTing factors. Neuro exam stable tonight  Plan Meds ordered this encounter  Medications  . butalbital-acetaminophen-caffeine (FIORICET, ESGIC) 50-325-40 MG tablet    Sig: Take 1-2 tablets by mouth 2 (two) times daily as needed for headache.    Dispense:  25 tablet    Refill:  0  . ondansetron (ZOFRAN-ODT) 4 MG disintegrating tablet    Sig: Take 1 tablet (4 mg total) by mouth every 8 (eight) hours as needed for nausea or vomiting.    Dispense:  12 tablet    Refill:  0  When acute illness stabilized then needs reeval of HA if not resolved by use of Esgic  By signing my name below, I, Stann Ore, attest that this documentation has been prepared under the direction and in the presence of Kristen Sia, MD. Electronically Signed: Stann Ore, Scribe. 02/06/2015 , 7:22 PM .  I have completed the patient encounter in its entirety as documented by the scribe, with editing by me where necessary. Robert P. Merla Riches, M.D.

## 2016-03-18 ENCOUNTER — Ambulatory Visit (INDEPENDENT_AMBULATORY_CARE_PROVIDER_SITE_OTHER): Payer: Managed Care, Other (non HMO)

## 2016-03-18 ENCOUNTER — Ambulatory Visit (INDEPENDENT_AMBULATORY_CARE_PROVIDER_SITE_OTHER): Payer: Managed Care, Other (non HMO) | Admitting: Physician Assistant

## 2016-03-18 VITALS — BP 118/78 | HR 88 | Temp 98.1°F | Resp 17 | Ht 65.5 in | Wt 169.0 lb

## 2016-03-18 DIAGNOSIS — M25511 Pain in right shoulder: Secondary | ICD-10-CM

## 2016-03-18 DIAGNOSIS — R002 Palpitations: Secondary | ICD-10-CM | POA: Diagnosis not present

## 2016-03-18 MED ORDER — CYCLOBENZAPRINE HCL 10 MG PO TABS: 10.0000 mg | ORAL_TABLET | Freq: Every day | ORAL | 0 refills | Status: DC

## 2016-03-18 MED ORDER — MELOXICAM 15 MG PO TABS: 15.0000 mg | ORAL_TABLET | Freq: Every day | ORAL | 1 refills | Status: DC

## 2016-03-18 NOTE — Progress Notes (Signed)
Patient ID: Kristen Davidson, female    DOB: 10-08-1990, 25 y.o.   MRN: [id number]  PCP: Lucilla EdinAUB, STEVE A, MD  Chief Complaint  Patient presents with  . Shoulder Pain    Right. NKI. Pt reports "popping"   . Irregular Heart Beat    Pt states every once in a while feels like heart is "pounding". PT INFORMED OF 1 COMPLAINT PER VISIT POLICY     Subjective:   Presents for evaluation of shoulder pain and chest pain.  Has not had a seasonal flu vaccine this season.  1. RIGHT shoulder pain. Has had achiness previously. On Friday 03/15/2016 evening, the pain began. Has been applying a heating pad since then. Her mother looked and thought it looked swollen. When she raises her arm, then lowers it, it pops.  Too painful to go to church yesterday. Sits at a computer for work. No injury that she recalls. RIGHT hand dominant. No paresthesias, weakness. Has taken a couple of doses of NSAIDS.  2. Chest has been hurting. "It's probably anxiety. I'm planning a wedding." Began in 12/2015. Flutters, goes really fast. Was occurring twice weekly, lasting about a minute, but not in the last two weeks. "I don't know if it hurts, I just felt like I couldn't get a breath." Sometimes it was so bad she had to hold on to something or someone. Taking 2 deep breaths would resolve it. No associated nausea. Gets headaches and dizziness, which she associates with working at a computer, which has not been worse or more frequent or associated with these episodes.   Review of Systems As above.    Patient Active Problem List   Diagnosis Date Noted  . H. pylori infection 08/18/2012  . Chronic back pain 08/18/2012     Prior to Admission medications   Medication Sig Start Date End Date Taking? Authorizing Provider  albuterol (PROVENTIL HFA;VENTOLIN HFA) 108 (90 BASE) MCG/ACT inhaler Inhale 2 puffs into the lungs every 6 (six) hours as needed for wheezing or shortness of breath. 07/23/14  Yes Wallis BambergMario Mani, PA-C    TRI-PREVIFEM 0.18/0.215/0.25 MG-35 MCG tablet Take 1 tablet by mouth daily. 06/22/14  Yes Historical Provider, MD  butalbital-acetaminophen-caffeine (FIORICET, ESGIC) 50-325-40 MG tablet Take 1-2 tablets by mouth 2 (two) times daily as needed for headache. Patient not taking: Reported on 03/18/2016 02/06/15   Tonye Pearsonobert P Doolittle, MD     No Known Allergies     Objective:  Physical Exam  Constitutional: She is oriented to person, place, and time. She appears well-developed and well-nourished. She is active and cooperative. No distress.  BP 118/78 (BP Location: Left Arm, Patient Position: Sitting, Cuff Size: Normal)   Pulse 88   Temp 98.1 F (36.7 C) (Oral)   Resp 17   Ht 5' 5.5" (1.664 m)   Wt 169 lb (76.7 kg)   LMP 02/16/2016   SpO2 99%   BMI 27.70 kg/m   HENT:  Head: Normocephalic and atraumatic.  Right Ear: Hearing normal.  Left Ear: Hearing normal.  Eyes: Conjunctivae are normal. No scleral icterus.  Neck: Normal range of motion. Neck supple. No thyromegaly present.  Cardiovascular: Normal rate, regular rhythm and normal heart sounds.   Pulses:      Radial pulses are 2+ on the right side, and 2+ on the left side.  Pulmonary/Chest: Effort normal and breath sounds normal.  Musculoskeletal:       Right shoulder: She exhibits decreased range of motion, tenderness and pain. She exhibits  no bony tenderness, no swelling, no effusion, no crepitus, no deformity, no laceration, no spasm, normal pulse and normal strength.       Left shoulder: Normal.       Right elbow: Normal.      Right wrist: Normal.       Cervical back: Normal.  Lymphadenopathy:       Head (right side): No tonsillar, no preauricular, no posterior auricular and no occipital adenopathy present.       Head (left side): No tonsillar, no preauricular, no posterior auricular and no occipital adenopathy present.    She has no cervical adenopathy.       Right: No supraclavicular adenopathy present.       Left: No  supraclavicular adenopathy present.  Neurological: She is alert and oriented to person, place, and time. She has normal strength. No sensory deficit.  Skin: Skin is warm, dry and intact. No rash noted. No cyanosis or erythema. Nails show no clubbing.  Psychiatric: She has a normal mood and affect. Her speech is normal and behavior is normal.    EKG reviewed with Dr. Clelia CroftShaw. NSR. No ischemia or arrhythmia..  Dg Shoulder Right  Result Date: 03/18/2016 CLINICAL DATA:  Acute right shoulder pain EXAM: RIGHT SHOULDER - 2+ VIEW COMPARISON:  None. FINDINGS: There is no evidence of fracture or dislocation. There is no evidence of arthropathy or other focal bone abnormality. Soft tissues are unremarkable. IMPRESSION: Negative. Electronically Signed   By: Marlan Palauharles  Clark M.D.   On: 03/18/2016 15:04       Assessment & Plan:   1. Acute pain of right shoulder Likely tendonitis. Meloxicam and cyclobenzaprine. If no better in 2 weeks, plan referral to orthopedics. - DG Shoulder Right; Future - cyclobenzaprine (FLEXERIL) 10 MG tablet; Take 1 tablet (10 mg total) by mouth at bedtime.  Dispense: 30 tablet; Refill: 0 - meloxicam (MOBIC) 15 MG tablet; Take 1 tablet (15 mg total) by mouth daily.  Dispense: 30 tablet; Refill: 1  2. Heart palpitations Have resolved x 2 weeks. May be due to stress of wedding planning. If symptoms recur, plan referral to cardiology. - CBC with Differential/Platelet - Comprehensive metabolic panel - TSH - EKG 12-Lead   Fernande Brashelle S. Keiron Iodice, PA-C Physician Assistant-Certified [facility]

## 2016-03-18 NOTE — Patient Instructions (Addendum)
If the pain persists in 2 weeks, let me know. I'll plan to refer you to an orthopedic specialist.  If the heart fluttering thing gets worse, let me know. I'll plan to refer you to a cardiologist.    IF you received an x-ray today, you will receive an invoice from [facility]. Please contact [facility] at [phone] with questions or concerns regarding your invoice.   IF you received labwork today, you will receive an invoice from [facility]. Please contact [facility] at [phone] with questions or concerns regarding your invoice.   Our billing staff will not be able to assist you with questions regarding bills from these companies.  You will be contacted with the lab results as soon as they are available. The fastest way to get your results is to activate your My Chart account. Instructions are located on the last page of this paperwork. If you have not heard from us regarding the results in 2 weeks, please contact this office.

## 2016-03-19 LAB — COMPREHENSIVE METABOLIC PANEL
ALT: 23 IU/L (ref 0–32)
AST: 21 IU/L (ref 0–40)
Albumin/Globulin Ratio: 1.2 (ref 1.2–2.2)
Albumin: 4.6 g/dL (ref 3.5–5.5)
Alkaline Phosphatase: 87 IU/L (ref 39–117)
BUN/Creatinine Ratio: 22 (ref 9–23)
BUN: 12 mg/dL (ref 6–20)
Bilirubin Total: 0.3 mg/dL (ref 0.0–1.2)
CO2: 26 mmol/L (ref 18–29)
Calcium: 9.4 mg/dL (ref 8.7–10.2)
Chloride: 98 mmol/L (ref 96–106)
Creatinine, Ser: 0.54 mg/dL — ABNORMAL LOW (ref 0.57–1.00)
GFR calc Af Amer: 150 mL/min/{1.73_m2} (ref >59)
GFR calc non Af Amer: 130 mL/min/{1.73_m2} (ref >59)
Globulin, Total: 3.7 g/dL (ref 1.5–4.5)
Glucose: 72 mg/dL (ref 65–99)
Potassium: 4.2 mmol/L (ref 3.5–5.2)
Sodium: 140 mmol/L (ref 134–144)
Total Protein: 8.3 g/dL (ref 6.0–8.5)

## 2016-03-19 LAB — CBC WITH DIFFERENTIAL/PLATELET
Basophils Absolute: 0 10*3/uL (ref 0.0–0.2)
Basos: 0 %
EOS (ABSOLUTE): 0.2 10*3/uL (ref 0.0–0.4)
Eos: 3 %
Hematocrit: 39.7 % (ref 34.0–46.6)
Hemoglobin: 13.1 g/dL (ref 11.1–15.9)
Immature Grans (Abs): 0 10*3/uL (ref 0.0–0.1)
Immature Granulocytes: 0 %
Lymphocytes Absolute: 2.6 10*3/uL (ref 0.7–3.1)
Lymphs: 30 %
MCH: 30 pg (ref 26.6–33.0)
MCHC: 33 g/dL (ref 31.5–35.7)
MCV: 91 fL (ref 79–97)
Monocytes Absolute: 0.9 10*3/uL (ref 0.1–0.9)
Monocytes: 11 %
Neutrophils Absolute: 4.8 10*3/uL (ref 1.4–7.0)
Neutrophils: 56 %
Platelets: 273 10*3/uL (ref 150–379)
RBC: 4.37 x10E6/uL (ref 3.77–5.28)
RDW: 13.2 % (ref 12.3–15.4)
WBC: 8.5 10*3/uL (ref 3.4–10.8)

## 2016-03-19 LAB — TSH: TSH: 1.12 u[IU]/mL (ref 0.450–4.500)

## 2016-05-14 ENCOUNTER — Telehealth: Payer: Self-pay

## 2016-05-14 ENCOUNTER — Ambulatory Visit (INDEPENDENT_AMBULATORY_CARE_PROVIDER_SITE_OTHER): Payer: Managed Care, Other (non HMO) | Admitting: Physician Assistant

## 2016-05-14 VITALS — BP 120/80 | HR 115 | Temp 99.1°F | Resp 18 | Ht 65.5 in | Wt 171.0 lb

## 2016-05-14 DIAGNOSIS — R5383 Other fatigue: Secondary | ICD-10-CM | POA: Diagnosis not present

## 2016-05-14 DIAGNOSIS — J3489 Other specified disorders of nose and nasal sinuses: Secondary | ICD-10-CM

## 2016-05-14 DIAGNOSIS — J029 Acute pharyngitis, unspecified: Secondary | ICD-10-CM | POA: Diagnosis not present

## 2016-05-14 DIAGNOSIS — R059 Cough, unspecified: Secondary | ICD-10-CM

## 2016-05-14 DIAGNOSIS — R05 Cough: Secondary | ICD-10-CM | POA: Diagnosis not present

## 2016-05-14 DIAGNOSIS — B349 Viral infection, unspecified: Secondary | ICD-10-CM

## 2016-05-14 LAB — POCT INFLUENZA A/B
Influenza A, POC: NEGATIVE
Influenza B, POC: NEGATIVE

## 2016-05-14 LAB — POCT RAPID STREP A (OFFICE): Rapid Strep A Screen: NEGATIVE

## 2016-05-14 MED ORDER — BENZONATATE 100 MG PO CAPS: 100.0000 mg | ORAL_CAPSULE | Freq: Three times a day (TID) | ORAL | 0 refills | Status: DC | PRN

## 2016-05-14 MED ORDER — FLUTICASONE PROPIONATE 50 MCG/ACT NA SUSP: 2.0000 | Freq: Every day | NASAL | 6 refills | Status: DC

## 2016-05-14 MED ORDER — MUCINEX DM MAXIMUM STRENGTH 60-1200 MG PO TB12: 1.0000 | ORAL_TABLET | Freq: Two times a day (BID) | ORAL | 1 refills | Status: DC

## 2016-05-14 NOTE — Patient Instructions (Addendum)
Please stay well hydrated, drink 1-2 liters of water a day. Warm tea with honey will help you stay hydrated and soothe a sore throat.  Come back if you are not better in 5-7 days.  Flonase - 2 sprays each nostril at night before bed and in the morning for symptom relief.  Tessalon is for daytime cough.   Thank you for coming in today. I hope you feel we met your needs.  Feel free to call [facility] if you have any questions or further requests.  Please consider signing up for MyChart if you do not already have it, as this is a great way to communicate with me.  Best,  Whitney McVey, PA-C   IF you received an x-ray today, you will receive an invoice from [facility]. Please contact [facility] at [phone] with questions or concerns regarding your invoice.   IF you received labwork today, you will receive an invoice from [facility]. Please contact [facility] at [phone] with questions or concerns regarding your invoice.   Our billing staff will not be able to assist you with questions regarding bills from these companies.  You will be contacted with the lab results as soon as they are available. The fastest way to get your results is to activate your My Chart account. Instructions are located on the last page of this paperwork. If you have not heard from [place] regarding the results in 2 weeks, please contact this office.

## 2016-05-14 NOTE — Progress Notes (Signed)
Kristen Davidson  MRN: [id number] DOB: October 29, 1990  PCP: Gwen Pounds, MD  Subjective:  Pt is a 26 year old female who presents to clinic for cough.  Symptoms started yesterday with sore throat and cough. This morning she had a horse voice with wheezing. +dizziness She had flu shot this season. She is nervous because her mother has lupus and is immunocompromised. She wants to be tested for flu and strep.  Denies chest pain, headache, SOB, nausea, vomiting.   Review of Systems  Constitutional: Positive for fatigue. Negative for chills, diaphoresis and fever.  HENT: Positive for sore throat and voice change. Negative for congestion, postnasal drip, rhinorrhea, sinus pressure and sneezing.   Respiratory: Positive for cough. Negative for chest tightness, shortness of breath and wheezing.   Cardiovascular: Negative for chest pain and palpitations.  Gastrointestinal: Negative for abdominal pain, diarrhea, nausea and vomiting.  Neurological: Negative for weakness, light-headedness and headaches.    Patient Active Problem List   Diagnosis Date Noted  . H. pylori infection 08/18/2012  . Chronic back pain 08/18/2012    Current Outpatient Prescriptions on File Prior to Visit  Medication Sig Dispense Refill  . albuterol (PROVENTIL HFA;VENTOLIN HFA) 108 (90 BASE) MCG/ACT inhaler Inhale 2 puffs into the lungs every 6 (six) hours as needed for wheezing or shortness of breath. 1 Inhaler 0  . butalbital-acetaminophen-caffeine (FIORICET, ESGIC) 50-325-40 MG tablet Take 1-2 tablets by mouth 2 (two) times daily as needed for headache. (Patient not taking: Reported on 05/14/2016) 25 tablet 0  . cyclobenzaprine (FLEXERIL) 10 MG tablet Take 1 tablet (10 mg total) by mouth at bedtime. (Patient not taking: Reported on 05/14/2016) 30 tablet 0  . meloxicam (MOBIC) 15 MG tablet Take 1 tablet (15 mg total) by mouth daily. (Patient not taking: Reported on 05/14/2016) 30 tablet 1  . TRI-PREVIFEM 0.18/0.215/0.25 MG-35  MCG tablet Take 1 tablet by mouth daily.  3   No current facility-administered medications on file prior to visit.     No Known Allergies   Objective:  BP 120/80 (BP Location: Right Arm, Patient Position: Sitting, Cuff Size: Small)   Pulse (!) 115   Temp 99.1 F (37.3 C) (Oral)   Resp 18   Ht 5' 5.5" (1.664 m)   Wt 171 lb (77.6 kg)   LMP 04/23/2016   SpO2 97%   BMI 28.02 kg/m   Physical Exam  Constitutional: She is oriented to person, place, and time and well-developed, well-nourished, and in no distress. No distress.  HENT:  Right Ear: Tympanic membrane normal.  Left Ear: Tympanic membrane is bulging.  Nose: Mucosal edema and rhinorrhea present. Right sinus exhibits no maxillary sinus tenderness and no frontal sinus tenderness. Left sinus exhibits no maxillary sinus tenderness and no frontal sinus tenderness.  Mouth/Throat: Mucous membranes are normal. Posterior oropharyngeal edema present. No oropharyngeal exudate or posterior oropharyngeal erythema.  Cardiovascular: Normal rate, regular rhythm and normal heart sounds.   Pulmonary/Chest: Effort normal and breath sounds normal. No respiratory distress.  Neurological: She is alert and oriented to person, place, and time. GCS score is 15.  Skin: Skin is warm and dry.  Psychiatric: Mood, memory, affect and judgment normal.  Vitals reviewed.  Results for orders placed or performed in visit on 05/14/16  POCT rapid strep A  Result Value Ref Range   Rapid Strep A Screen Negative Negative  POCT Influenza A/B  Result Value Ref Range   Influenza A, POC Negative Negative   Influenza B, POC Negative  Negative    Assessment and Plan :  1. Viral illness 2. Sore throat 3. Fatigue, unspecified type 4. Cough 5. Nasal drainage - POCT rapid strep A - Culture, Group A Strep - POCT Influenza A/B - benzonatate (TESSALON) 100 MG capsule; Take 1-2 capsules (100-200 mg total) by mouth 3 (three) times daily as needed for cough.  Dispense:  40 capsule; Refill: 0 - fluticasone (FLONASE) 50 MCG/ACT nasal spray; Place 2 sprays into both nostrils daily.  Dispense: 16 g; Refill: 6 - Dextromethorphan-Guaifenesin (MUCINEX DM MAXIMUM STRENGTH) 60-1200 MG TB12; Take 1 tablet by mouth every 12 (twelve) hours.  Dispense: 14 each; Refill: 1 - Negative flu and strep. Culture is pending. Will treat supportively: Push fluids, rest, cough suppression. RTC in 5-7 days if no improvement.   Marco CollieWhitney [place], PA-C  Primary Care at [facility] 05/14/2016 11:46 AM

## 2016-05-14 NOTE — Telephone Encounter (Signed)
Mom called (on hippa) Seen this morning and is home feeling  Worse. Advised flu and strep neg. Needs to follow supportive care, keep hydrated, gatorade by patient and drink q 15 minutes. Use tylenol and ibuprofen for fever. If concerns about fever, hydration, breathing or no improvement come back and be re evaluated Mom verbalized understanding

## 2016-05-15 ENCOUNTER — Telehealth: Payer: Self-pay

## 2016-05-15 DIAGNOSIS — R05 Cough: Secondary | ICD-10-CM

## 2016-05-15 DIAGNOSIS — R059 Cough, unspecified: Secondary | ICD-10-CM

## 2016-05-15 DIAGNOSIS — J4530 Mild persistent asthma, uncomplicated: Secondary | ICD-10-CM

## 2016-05-15 MED ORDER — ALBUTEROL SULFATE HFA 108 (90 BASE) MCG/ACT IN AERS: 2.0000 | INHALATION_SPRAY | Freq: Four times a day (QID) | RESPIRATORY_TRACT | 0 refills | Status: DC | PRN

## 2016-05-15 NOTE — Telephone Encounter (Signed)
Pt needs a reill on her albuterol (PROVENTIL HFA;VENTOLIN HFA) 108 (90 BASE) MCG/ACT inhaler [[id number]][[id number]] .Pharmacy:  [id number] [place] - [address]  Please advise:  [phone]

## 2016-05-16 LAB — CULTURE, GROUP A STREP: Strep A Culture: NEGATIVE

## 2016-05-17 MED ORDER — ALBUTEROL SULFATE HFA 108 (90 BASE) MCG/ACT IN AERS: 2.0000 | INHALATION_SPRAY | RESPIRATORY_TRACT | 1 refills | Status: DC | PRN

## 2016-05-17 NOTE — Addendum Note (Signed)
Addended by: Norberto SorensonSHAW, EVA on: 05/17/2016 11:06 AM   Modules accepted: Orders

## 2016-05-17 NOTE — Telephone Encounter (Signed)
Looks like was done 2/14 and again today 2/16.

## 2017-02-26 LAB — OB RESULTS CONSOLE RUBELLA ANTIBODY, IGM: Rubella: IMMUNE

## 2017-02-26 LAB — OB RESULTS CONSOLE ABO/RH: RH Type: POSITIVE

## 2017-02-26 LAB — OB RESULTS CONSOLE HEPATITIS B SURFACE ANTIGEN: Hepatitis B Surface Ag: NEGATIVE

## 2017-02-26 LAB — OB RESULTS CONSOLE GC/CHLAMYDIA
Chlamydia: NEGATIVE
Gonorrhea: NEGATIVE

## 2017-02-26 LAB — OB RESULTS CONSOLE RPR: RPR: NONREACTIVE

## 2017-02-26 LAB — OB RESULTS CONSOLE ANTIBODY SCREEN: Antibody Screen: NEGATIVE

## 2017-04-01 NOTE — L&D Delivery Note (Signed)
Delivery Note At 11:43 AM a viable female was delivered via Vaginal, Spontaneous (Presentation: LOA).  APGAR: 9, 9; weight pending.   Placenta status: S, I. 3V Cord with the following complications: loose nuchal x 1; reduced.  Cord pH: n/a  Anesthesia:  CLEA Episiotomy: None Lacerations: 1st degree Suture Repair: 3.0 vicryl rapide Est. Blood Loss (mL): 200  Mom to postpartum.  Baby to Couplet care / Skin to Skin.  Kristen Davidson 09/22/2017, 12:40 PM

## 2017-04-07 ENCOUNTER — Inpatient Hospital Stay (HOSPITAL_COMMUNITY)
Admission: AD | Admit: 2017-04-07 | Discharge: 2017-04-07 | Disposition: A | Discharge status: Home / Self Care | Payer: Managed Care, Other (non HMO) | Source: Ambulatory Visit | Attending: Obstetrics and Gynecology | Admitting: Obstetrics and Gynecology

## 2017-04-07 ENCOUNTER — Encounter (HOSPITAL_COMMUNITY): Payer: Self-pay | Admitting: *Deleted

## 2017-04-07 ENCOUNTER — Other Ambulatory Visit: Payer: Self-pay

## 2017-04-07 DIAGNOSIS — O26892 Other specified pregnancy related conditions, second trimester: Secondary | ICD-10-CM

## 2017-04-07 DIAGNOSIS — Z3A15 15 weeks gestation of pregnancy: Secondary | ICD-10-CM | POA: Diagnosis not present

## 2017-04-07 DIAGNOSIS — L739 Follicular disorder, unspecified: Secondary | ICD-10-CM

## 2017-04-07 DIAGNOSIS — R51 Headache: Secondary | ICD-10-CM | POA: Insufficient documentation

## 2017-04-07 DIAGNOSIS — R519 Headache, unspecified: Secondary | ICD-10-CM

## 2017-04-07 DIAGNOSIS — O99712 Diseases of the skin and subcutaneous tissue complicating pregnancy, second trimester: Secondary | ICD-10-CM | POA: Insufficient documentation

## 2017-04-07 DIAGNOSIS — Z3492 Encounter for supervision of normal pregnancy, unspecified, second trimester: Secondary | ICD-10-CM

## 2017-04-07 LAB — URINALYSIS, ROUTINE W REFLEX MICROSCOPIC
Bilirubin Urine: NEGATIVE
Glucose, UA: NEGATIVE mg/dL
Hgb urine dipstick: NEGATIVE
Ketones, ur: NEGATIVE mg/dL
Nitrite: NEGATIVE
Protein, ur: NEGATIVE mg/dL
Specific Gravity, Urine: 1.006 (ref 1.005–1.030)
pH: 6 (ref 5.0–8.0)

## 2017-04-07 MED ORDER — ACETAMINOPHEN 500 MG PO TABS
1000.0000 mg | ORAL_TABLET | Freq: Once | ORAL | Status: AC
  Administered 2017-04-07: 1000 mg via ORAL
  Filled 2017-04-07: qty 2

## 2017-04-07 NOTE — MAU Note (Addendum)
During preg has been having swelling in feet, cramping in feet.  Had a HA yesterday, BP 95/75. Called her dr. Jerilynn SomStill had a headache this morning. Ate and retained it, but is not drinking like she should.   Has a boil, saw some blood in her underwear today, doesn't know if it is vaginal or from the boil.  Was dizzy last night. Bp was low at work again today also.still has headache, has not taken anything for it. Was cramping earlier, not now

## 2017-04-07 NOTE — Discharge Instructions (Signed)
Folliculitis Folliculitis is inflammation of the hair follicles. Folliculitis most commonly occurs on the scalp, thighs, legs, back, and buttocks. However, it can occur anywhere on the body. What are the causes? This condition may be caused by:  A bacterial infection (common).  A fungal infection.  A viral infection.  Coming into contact with certain chemicals, especially oils and tars.  Shaving or waxing.  Applying greasy ointments or creams to your skin often.  Long-lasting folliculitis and folliculitis that keeps coming back can be caused by bacteria that live in the nostrils. What increases the risk? This condition is more likely to develop in people with:  A weakened immune system.  Diabetes.  Obesity.  What are the signs or symptoms? Symptoms of this condition include:  Redness.  Soreness.  Swelling.  Itching.  Small white or yellow, pus-filled, itchy spots (pustules) that appear over a reddened area. If there is an infection that goes deep into the follicle, these may develop into a boil (furuncle).  A group of closely packed boils (carbuncle). These tend to form in hairy, sweaty areas of the body.  How is this diagnosed? This condition is diagnosed with a skin exam. To find what is causing the condition, your health care provider may take a sample of one of the pustules or boils for testing. How is this treated? This condition may be treated by:  Applying warm compresses to the affected areas.  Taking an antibiotic medicine or applying an antibiotic medicine to the skin.  Applying or bathing with an antiseptic solution.  Taking an over-the-counter medicine to help with itching.  Having a procedure to drain any pustules or boils. This may be done if a pustule or boil contains a lot of pus or fluid.  Laser hair removal. This may be done to treat long-lasting folliculitis.  Follow these instructions at home:  If directed, apply heat to the affected  area as often as told by your health care provider. Use the heat source that your health care provider recommends, such as a moist heat pack or a heating pad. ? Place a towel between your skin and the heat source. ? Leave the heat on for 20-30 minutes. ? Remove the heat if your skin turns bright red. This is especially important if you are unable to feel pain, heat, or cold. You may have a greater risk of getting burned.  If you were prescribed an antibiotic medicine, use it as told by your health care provider. Do not stop using the antibiotic even if you start to feel better.  Take over-the-counter and prescription medicines only as told by your health care provider.  Do not shave irritated skin.  Keep all follow-up visits as told by your health care provider. This is important. Get help right away if:  You have more redness, swelling, or pain in the affected area.  Red streaks are spreading from the affected area.  You have a fever. This information is not intended to replace advice given to you by your health care provider. Make sure you discuss any questions you have with your health care provider. Document Released: 05/27/2001 Document Revised: 10/06/2015 Document Reviewed: 01/06/2015 Elsevier Interactive Patient Education  2018 [employer] Second Trimester of Pregnancy The second trimester is from week 13 through week 28, month 4 through 6. This is often the time in pregnancy that you feel your best. Often times, morning sickness has lessened or quit. You may have more energy, and you may get hungry  more often. Your unborn baby (fetus) is growing rapidly. At the end of the sixth month, he or she is about 9 inches long and weighs about 1 pounds. You will likely feel the baby move (quickening) between 18 and 20 weeks of pregnancy. Follow these instructions at home:  Avoid all smoking, herbs, and alcohol. Avoid drugs not approved by your doctor.  Do not use any tobacco products,  including cigarettes, chewing tobacco, and electronic cigarettes. If you need help quitting, ask your doctor. You may get counseling or other support to help you quit.  Only take medicine as told by your doctor. Some medicines are safe and some are not during pregnancy.  Exercise only as told by your doctor. Stop exercising if you start having cramps.  Eat regular, healthy meals.  Wear a good support bra if your breasts are tender.  Do not use hot tubs, steam rooms, or saunas.  Wear your seat belt when driving.  Avoid raw meat, uncooked cheese, and liter boxes and soil used by cats.  Take your prenatal vitamins.  Take 1500-2000 milligrams of calcium daily starting at the 20th week of pregnancy until you deliver your baby.  Try taking medicine that helps you poop (stool softener) as needed, and if your doctor approves. Eat more fiber by eating fresh fruit, vegetables, and whole grains. Drink enough fluids to keep your pee (urine) clear or pale yellow.  Take warm water baths (sitz baths) to soothe pain or discomfort caused by hemorrhoids. Use hemorrhoid cream if your doctor approves.  If you have puffy, bulging veins (varicose veins), wear support hose. Raise (elevate) your feet for 15 minutes, 3-4 times a day. Limit salt in your diet.  Avoid heavy lifting, wear low heals, and sit up straight.  Rest with your legs raised if you have leg cramps or low back pain.  Visit your dentist if you have not gone during your pregnancy. Use a soft toothbrush to brush your teeth. Be gentle when you floss.  You can have sex (intercourse) unless your doctor tells you not to.  Go to your doctor visits. Get help if:  You feel dizzy.  You have mild cramps or pressure in your lower belly (abdomen).  You have a nagging pain in your belly area.  You continue to feel sick to your stomach (nauseous), throw up (vomit), or have watery poop (diarrhea).  You have bad smelling fluid coming from your  vagina.  You have pain with peeing (urination). Get help right away if:  You have a fever.  You are leaking fluid from your vagina.  You have spotting or bleeding from your vagina.  You have severe belly cramping or pain.  You lose or gain weight rapidly.  You have trouble catching your breath and have chest pain.  You notice sudden or extreme puffiness (swelling) of your face, hands, ankles, feet, or legs.  You have not felt the baby move in over an hour.  You have severe headaches that do not go away with medicine.  You have vision changes. This information is not intended to replace advice given to you by your health care provider. Make sure you discuss any questions you have with your health care provider. Document Released: 06/12/2009 Document Revised: 08/24/2015 Document Reviewed: 05/19/2012 Elsevier Interactive Patient Education  2017 [employer].

## 2017-04-07 NOTE — MAU Provider Note (Signed)
History     CSN: [id number]  Arrival date and time: 04/07/17 1544   None     Chief Complaint  Patient presents with  . Headache  . Nausea   HPI Kristen Davidson is 27 y.o. G3P1011 6252w3d weeks presenting with swelling and cramping in her feet.  Headache with dizziness that began yesterday and continues.  Headaches today.  Rates as its worse 8/10; 6/10 on admission to [facility]U. Has not taken anything for the headache.  States she does not drink very much water during the day.  Sates she saw blood in her panties that she thinks came from a boil but not sure. BP was taken at work today and told it was low.  WNL at this time.    Past Medical History:  Diagnosis Date  . Abnormal Pap smear   . Allergy   . Asthma   . WUJWJXBJ(478.2Headache(784.0)     Past Surgical History:  Procedure Laterality Date  . CHOLECYSTECTOMY N/A 10/09/2012   Procedure: LAPAROSCOPIC CHOLECYSTECTOMY WITH INTRAOPERATIVE CHOLANGIOGRAM;  Surgeon: Kandis Cockingavid H Newman, MD;  Location: [facility] OR;  Service: General;  Laterality: N/A;  . COLPOSCOPY    . TONSILLECTOMY    . WISDOM TOOTH EXTRACTION      Family History  Problem Relation Age of Onset  . Heart disease Maternal Grandfather   . Diabetes Maternal Grandfather   . Anxiety disorder Maternal Grandfather   . Hypertension Mother   . Heart disease Mother        PVCs  . Anemia Mother   . Thyroid disease Mother   . Migraines Mother   . Lupus Mother   . Rheum arthritis Mother   . Hypertension Father   . Diabetes Maternal Aunt   . Thyroid disease Maternal Grandmother   . Cancer Paternal Grandmother        lymphoma  . Cancer Paternal Grandfather        prostate  . Anesthesia problems Neg Hx   . Hypotension Neg Hx   . Malignant hyperthermia Neg Hx   . Pseudochol deficiency Neg Hx     Social History   Tobacco Use  . Smoking status: Never Smoker  . Smokeless tobacco: Never Used  Substance Use Topics  . Alcohol use: Yes    Alcohol/week: 0.0 oz    Comment: rarely  . Drug use: No     Allergies: No Known Allergies  Medications Prior to Admission  Medication Sig Dispense Refill Last Dose  . albuterol (PROVENTIL HFA;VENTOLIN HFA) 108 (90 Base) MCG/ACT inhaler Inhale 2 puffs into the lungs every 4 (four) hours as needed for wheezing or shortness of breath. 1 Inhaler 1   . benzonatate (TESSALON) 100 MG capsule Take 1-2 capsules (100-200 mg total) by mouth 3 (three) times daily as needed for cough. 40 capsule 0   . Dextromethorphan-Guaifenesin (MUCINEX DM MAXIMUM STRENGTH) 60-1200 MG TB12 Take 1 tablet by mouth every 12 (twelve) hours. 14 each 1   . fluticasone (FLONASE) 50 MCG/ACT nasal spray Place 2 sprays into both nostrils daily. 16 g 6     Review of Systems  Constitutional: Negative for appetite change, chills, fatigue and fever.  Respiratory: Negative for chest tightness.   Cardiovascular: Negative for chest pain.  Gastrointestinal: Positive for nausea. Negative for abdominal pain and vomiting.  Genitourinary: Negative for dysuria, frequency, vaginal bleeding and vaginal discharge.  Musculoskeletal:       Swelling in her feet with discomfort  Neurological: Positive for headaches.   Physical  Exam   Blood pressure 119/67, pulse 88, temperature 98.2 F (36.8 C), temperature source Oral, resp. rate 16, weight 172 lb 8 oz (78.2 kg), last menstrual period 04/23/2016, SpO2 97 %.  Physical Exam  Nursing note and vitals reviewed. Constitutional: She is oriented to person, place, and time. She appears well-developed and well-nourished. No distress.  HENT:  Head: Normocephalic.  Neck: Normal range of motion.  Respiratory: Effort normal.  GI: There is no tenderness. There is no rebound and no guarding.  Genitourinary: There is no rash, tenderness or lesion on the right labia. There is no rash, tenderness or lesion on the left labia. Uterus is enlarged (15-16 weeks in size, nontender). Uterus is not tender. Cervix exhibits no motion tenderness and no discharge. No  bleeding in the vagina. No vaginal discharge found.  Genitourinary Comments: There is a single hair follicle infection noted just to the right of the left labia.  Neg for abscess.  Center is not draining.  Area is pink and fluctuant.  Slightly tender.  Musculoskeletal: She exhibits no edema.  Neg for swelling bilateral feet/ankles/legs  Neurological: She is alert and oriented to person, place, and time. Coordination normal.  Skin: Skin is warm and dry.  Psychiatric: She has a normal mood and affect. Her behavior is normal. Thought content normal.   FHR 155    Results for orders placed or performed during the hospital encounter of 04/07/17 (from the past 24 hour(s))  Urinalysis, Routine w reflex microscopic     Status: Abnormal   Collection Time: 04/07/17  3:55 PM  Result Value Ref Range   Color, Urine STRAW (A) YELLOW   APPearance CLEAR CLEAR   Specific Gravity, Urine 1.006 1.005 - 1.030   pH 6.0 5.0 - 8.0   Glucose, UA NEGATIVE NEGATIVE mg/dL   Hgb urine dipstick NEGATIVE NEGATIVE   Bilirubin Urine NEGATIVE NEGATIVE   Ketones, ur NEGATIVE NEGATIVE mg/dL   Protein, ur NEGATIVE NEGATIVE mg/dL   Nitrite NEGATIVE NEGATIVE   Leukocytes, UA LARGE (A) NEGATIVE   RBC / HPF 0-5 0 - 5 RBC/hpf   WBC, UA 0-5 0 - 5 WBC/hpf   Bacteria, UA FEW (A) NONE SEEN   Squamous Epithelial / LPF 0-5 (A) NONE SEEN   Mucus PRESENT    MAU Course  Procedures  MDM MSE Exam Labs Tylenol 1gm po given for headache.  Rates headache 30 minutes after taking Tylenol as 2/10.   Consulted with Dr. Marcelle Overlie.  MSE, lab and exam results reported.  Reported  Headache less after Tylenol .  Assessment and Plan  A: Headache in second trimester pregnancy with normal BP     Negative for findings of edema      Folliculitis, vulva  P:  Discussed that it is safe to take Tylenol during pregnancy      May use warm soak and Neosporin to folliculitis on the vulva      Stressed importance of staying well hydrated.          Dennison Mascot Key 04/07/2017, 7:56 PM

## 2017-09-06 ENCOUNTER — Encounter (HOSPITAL_COMMUNITY): Payer: Self-pay | Admitting: *Deleted

## 2017-09-06 ENCOUNTER — Inpatient Hospital Stay (HOSPITAL_COMMUNITY)
Admission: AD | Admit: 2017-09-06 | Discharge: 2017-09-06 | Disposition: A | Discharge status: Home / Self Care | Payer: BLUE CROSS/BLUE SHIELD | Source: Ambulatory Visit | Attending: Obstetrics and Gynecology | Admitting: Obstetrics and Gynecology

## 2017-09-06 DIAGNOSIS — O26893 Other specified pregnancy related conditions, third trimester: Secondary | ICD-10-CM | POA: Diagnosis not present

## 2017-09-06 DIAGNOSIS — Z3A37 37 weeks gestation of pregnancy: Secondary | ICD-10-CM | POA: Diagnosis not present

## 2017-09-06 DIAGNOSIS — O479 False labor, unspecified: Secondary | ICD-10-CM

## 2017-09-06 NOTE — MAU Note (Signed)
Crampiness all day. Ctxs started about 1600. Denies LOF or bleeding. Some vag d/c.

## 2017-09-06 NOTE — MAU Note (Signed)
I have communicated with Dr. Henderson Cloudomblin and reviewed vital signs:  Vitals:   09/06/17 1952  BP: 122/69  Pulse: 81  Resp: 18  Temp: 98.2 F (36.8 C)    Vaginal exam:  Dilation: 2 Effacement (%): 50 Cervical Position: Posterior Station: -2 Presentation: Vertex Exam by:: Olegario MessierKathy Koralynn Greenspan,.RN,   Also reviewed contraction pattern and that non-stress test is reactive.  It has been documented that patient is contracting every 4-*6 minutes with no cervical change over 1.5 hours not indicating active labor.  Patient denies any other complaints.  Based on this report provider has given order for discharge.  A discharge order and diagnosis entered by a provider.   Labor discharge instructions reviewed with patient.

## 2017-09-09 LAB — OB RESULTS CONSOLE GBS: GBS: NEGATIVE

## 2017-09-16 ENCOUNTER — Inpatient Hospital Stay (HOSPITAL_COMMUNITY)
Admission: AD | Admit: 2017-09-16 | Discharge: 2017-09-16 | Disposition: A | Discharge status: Home / Self Care | Payer: BLUE CROSS/BLUE SHIELD | Source: Ambulatory Visit | Attending: Obstetrics and Gynecology | Admitting: Obstetrics and Gynecology

## 2017-09-16 ENCOUNTER — Other Ambulatory Visit: Payer: Self-pay

## 2017-09-16 ENCOUNTER — Encounter (HOSPITAL_COMMUNITY): Payer: Self-pay | Admitting: *Deleted

## 2017-09-16 DIAGNOSIS — O471 False labor at or after 37 completed weeks of gestation: Secondary | ICD-10-CM | POA: Insufficient documentation

## 2017-09-16 DIAGNOSIS — Z3A39 39 weeks gestation of pregnancy: Secondary | ICD-10-CM | POA: Insufficient documentation

## 2017-09-16 DIAGNOSIS — O479 False labor, unspecified: Secondary | ICD-10-CM

## 2017-09-16 NOTE — MAU Provider Note (Signed)
Dr. Marcelle OverlieHolland unavailable to discharge patient after diagnosed with false labor. NST reviewed.   Fetal Monitoring: Baseline: 125 bpm Variability: moderate Accelerations: 15 x 15 Decelerations: none Contractions: few, irregular   Discharge order and diagnosis placed.   Marny LowensteinWenzel, Julie N, PA-C 09/16/2017 8:19 AM

## 2017-09-16 NOTE — Discharge Instructions (Signed)

## 2017-09-16 NOTE — MAU Note (Signed)
I have communicated with Dr. Marcelle OverlieHolland and Harlon FlorJ. Wenzel, NP reviewed vital signs:  Vitals:   09/16/17 0734 09/16/17 0820  BP: 127/69 117/65  Pulse: (!) 108 98  Resp: 20 18  Temp: (!) 97.5 F (36.4 C) 97.8 F (36.6 C)  SpO2: 99%     Vaginal exam:  Dilation: 1 Effacement (%): 50 Cervical Position: Posterior Station: -2 Presentation: Vertex Exam by:: Royetta AsalL. Wilson, RN,   Also reviewed contraction pattern and that non-stress test is reactive.  It has been documented that patient is contracting irregularly minutes with no cervical change since last week (per patient report)  not indicating active labor.  Patient denies any other complaints.  Based on this report provider has given order for discharge.  A discharge order and diagnosis entered by a provider.   Labor discharge instructions reviewed with patient.

## 2017-09-16 NOTE — MAU Note (Signed)
Pt presents with c/o ctxs every 5 minutes since last night.  Denies LOF or VB.  Reports FM.

## 2017-09-18 ENCOUNTER — Encounter (HOSPITAL_COMMUNITY): Payer: Self-pay | Admitting: *Deleted

## 2017-09-18 ENCOUNTER — Telehealth (HOSPITAL_COMMUNITY): Payer: Self-pay | Admitting: *Deleted

## 2017-09-18 NOTE — Telephone Encounter (Signed)
Preadmission screen  

## 2017-09-22 ENCOUNTER — Inpatient Hospital Stay (HOSPITAL_COMMUNITY): Payer: BLUE CROSS/BLUE SHIELD | Admitting: Anesthesiology

## 2017-09-22 ENCOUNTER — Encounter (HOSPITAL_COMMUNITY): Payer: Self-pay

## 2017-09-22 ENCOUNTER — Inpatient Hospital Stay (HOSPITAL_COMMUNITY)
Admission: RE | Admit: 2017-09-22 | Discharge: 2017-09-24 | DRG: 807 | Disposition: A | Discharge status: Home / Self Care | Payer: BLUE CROSS/BLUE SHIELD | Attending: Obstetrics & Gynecology | Admitting: Obstetrics & Gynecology

## 2017-09-22 DIAGNOSIS — O99214 Obesity complicating childbirth: Secondary | ICD-10-CM | POA: Diagnosis present

## 2017-09-22 DIAGNOSIS — E669 Obesity, unspecified: Secondary | ICD-10-CM | POA: Diagnosis present

## 2017-09-22 DIAGNOSIS — O9952 Diseases of the respiratory system complicating childbirth: Secondary | ICD-10-CM | POA: Diagnosis present

## 2017-09-22 DIAGNOSIS — J45909 Unspecified asthma, uncomplicated: Secondary | ICD-10-CM | POA: Diagnosis present

## 2017-09-22 DIAGNOSIS — Z3A39 39 weeks gestation of pregnancy: Secondary | ICD-10-CM | POA: Diagnosis not present

## 2017-09-22 DIAGNOSIS — Z3483 Encounter for supervision of other normal pregnancy, third trimester: Secondary | ICD-10-CM | POA: Diagnosis present

## 2017-09-22 DIAGNOSIS — Z3490 Encounter for supervision of normal pregnancy, unspecified, unspecified trimester: Secondary | ICD-10-CM

## 2017-09-22 LAB — CBC
HCT: 36.6 % (ref 36.0–46.0)
Hemoglobin: 12.2 g/dL (ref 12.0–15.0)
MCH: 28.8 pg (ref 26.0–34.0)
MCHC: 33.3 g/dL (ref 30.0–36.0)
MCV: 86.3 fL (ref 78.0–100.0)
Platelets: 269 10*3/uL (ref 150–400)
RBC: 4.24 MIL/uL (ref 3.87–5.11)
RDW: 14.7 % (ref 11.5–15.5)
WBC: 11.3 10*3/uL — ABNORMAL HIGH (ref 4.0–10.5)

## 2017-09-22 LAB — RPR: RPR Ser Ql: NONREACTIVE

## 2017-09-22 LAB — TYPE AND SCREEN
ABO/RH(D): O POS
Antibody Screen: NEGATIVE

## 2017-09-22 LAB — OB RESULTS CONSOLE HIV ANTIBODY (ROUTINE TESTING): HIV: NONREACTIVE

## 2017-09-22 MED ORDER — FENTANYL CITRATE (PF) 100 MCG/2ML IJ SOLN: 50.0000 ug | INTRAMUSCULAR | Status: DC | PRN

## 2017-09-22 MED ORDER — COCONUT OIL OIL
1.0000 "application " | TOPICAL_OIL | Status: DC | PRN
  Filled 2017-09-22: qty 120

## 2017-09-22 MED ORDER — SENNOSIDES-DOCUSATE SODIUM 8.6-50 MG PO TABS
2.0000 | ORAL_TABLET | ORAL | Status: DC
  Administered 2017-09-22 – 2017-09-24 (×2): 2 via ORAL
  Filled 2017-09-22 (×2): qty 2

## 2017-09-22 MED ORDER — ONDANSETRON HCL 4 MG/2ML IJ SOLN: 4.0000 mg | INTRAMUSCULAR | Status: DC | PRN

## 2017-09-22 MED ORDER — DIPHENHYDRAMINE HCL 25 MG PO CAPS: 25.0000 mg | ORAL_CAPSULE | Freq: Four times a day (QID) | ORAL | Status: DC | PRN

## 2017-09-22 MED ORDER — FLEET ENEMA 7-19 GM/118ML RE ENEM: 1.0000 | ENEMA | RECTAL | Status: DC | PRN

## 2017-09-22 MED ORDER — LACTATED RINGERS IV SOLN: 500.0000 mL | INTRAVENOUS | Status: DC | PRN

## 2017-09-22 MED ORDER — OXYTOCIN 40 UNITS IN LACTATED RINGERS INFUSION - SIMPLE MED
2.5000 [IU]/h | INTRAVENOUS | Status: DC
  Filled 2017-09-22: qty 1000

## 2017-09-22 MED ORDER — TERBUTALINE SULFATE 1 MG/ML IJ SOLN
0.2500 mg | Freq: Once | INTRAMUSCULAR | Status: DC | PRN
  Filled 2017-09-22: qty 1

## 2017-09-22 MED ORDER — SOD CITRATE-CITRIC ACID 500-334 MG/5ML PO SOLN: 30.0000 mL | ORAL | Status: DC | PRN

## 2017-09-22 MED ORDER — MISOPROSTOL 25 MCG QUARTER TABLET
25.0000 ug | ORAL_TABLET | ORAL | Status: DC | PRN
  Administered 2017-09-22: 25 ug via VAGINAL
  Filled 2017-09-22 (×3): qty 1

## 2017-09-22 MED ORDER — ONDANSETRON HCL 4 MG/2ML IJ SOLN
4.0000 mg | Freq: Four times a day (QID) | INTRAMUSCULAR | Status: DC | PRN
  Administered 2017-09-22: 4 mg via INTRAVENOUS
  Filled 2017-09-22: qty 2

## 2017-09-22 MED ORDER — LACTATED RINGERS IV SOLN
INTRAVENOUS | Status: DC
  Administered 2017-09-22: 01:00:00 via INTRAVENOUS

## 2017-09-22 MED ORDER — ALBUTEROL SULFATE (2.5 MG/3ML) 0.083% IN NEBU: 2.5000 mg | INHALATION_SOLUTION | RESPIRATORY_TRACT | Status: DC | PRN

## 2017-09-22 MED ORDER — LIDOCAINE HCL (PF) 1 % IJ SOLN
30.0000 mL | INTRAMUSCULAR | Status: DC | PRN
  Filled 2017-09-22: qty 30

## 2017-09-22 MED ORDER — OXYCODONE-ACETAMINOPHEN 5-325 MG PO TABS: 1.0000 | ORAL_TABLET | ORAL | Status: DC | PRN

## 2017-09-22 MED ORDER — LACTATED RINGERS IV SOLN: 500.0000 mL | Freq: Once | INTRAVENOUS | Status: DC

## 2017-09-22 MED ORDER — ALBUTEROL SULFATE HFA 108 (90 BASE) MCG/ACT IN AERS: 2.0000 | INHALATION_SPRAY | RESPIRATORY_TRACT | Status: DC | PRN

## 2017-09-22 MED ORDER — PRENATAL MULTIVITAMIN CH
1.0000 | ORAL_TABLET | Freq: Every day | ORAL | Status: DC
  Administered 2017-09-23: 1 via ORAL
  Filled 2017-09-22: qty 1

## 2017-09-22 MED ORDER — ZOLPIDEM TARTRATE 5 MG PO TABS: 5.0000 mg | ORAL_TABLET | Freq: Every evening | ORAL | Status: DC | PRN

## 2017-09-22 MED ORDER — FENTANYL 2.5 MCG/ML BUPIVACAINE 1/10 % EPIDURAL INFUSION (WH - ANES)
14.0000 mL/h | INTRAMUSCULAR | Status: DC | PRN
  Administered 2017-09-22: 14 mL/h via EPIDURAL
  Filled 2017-09-22: qty 100

## 2017-09-22 MED ORDER — IBUPROFEN 600 MG PO TABS
600.0000 mg | ORAL_TABLET | Freq: Four times a day (QID) | ORAL | Status: DC
  Administered 2017-09-22 – 2017-09-24 (×7): 600 mg via ORAL
  Filled 2017-09-22 (×6): qty 1

## 2017-09-22 MED ORDER — EPHEDRINE 5 MG/ML INJ
10.0000 mg | INTRAVENOUS | Status: DC | PRN
  Filled 2017-09-22: qty 2

## 2017-09-22 MED ORDER — TETANUS-DIPHTH-ACELL PERTUSSIS 5-2.5-18.5 LF-MCG/0.5 IM SUSP: 0.5000 mL | Freq: Once | INTRAMUSCULAR | Status: DC

## 2017-09-22 MED ORDER — OXYTOCIN 40 UNITS IN LACTATED RINGERS INFUSION - SIMPLE MED
1.0000 m[IU]/min | INTRAVENOUS | Status: DC
  Administered 2017-09-22: 2 m[IU]/min via INTRAVENOUS

## 2017-09-22 MED ORDER — BENZOCAINE-MENTHOL 20-0.5 % EX AERO: 1.0000 "application " | INHALATION_SPRAY | CUTANEOUS | Status: DC | PRN

## 2017-09-22 MED ORDER — WITCH HAZEL-GLYCERIN EX PADS: 1.0000 "application " | MEDICATED_PAD | CUTANEOUS | Status: DC | PRN

## 2017-09-22 MED ORDER — OXYCODONE-ACETAMINOPHEN 5-325 MG PO TABS: 2.0000 | ORAL_TABLET | ORAL | Status: DC | PRN

## 2017-09-22 MED ORDER — DIPHENHYDRAMINE HCL 50 MG/ML IJ SOLN: 12.5000 mg | INTRAMUSCULAR | Status: DC | PRN

## 2017-09-22 MED ORDER — ACETAMINOPHEN 325 MG PO TABS: 650.0000 mg | ORAL_TABLET | ORAL | Status: DC | PRN

## 2017-09-22 MED ORDER — ACETAMINOPHEN 325 MG PO TABS
650.0000 mg | ORAL_TABLET | ORAL | Status: DC | PRN
  Administered 2017-09-22: 650 mg via ORAL
  Filled 2017-09-22: qty 2

## 2017-09-22 MED ORDER — LIDOCAINE HCL (PF) 1 % IJ SOLN
INTRAMUSCULAR | Status: DC | PRN
  Administered 2017-09-22 (×2): 4 mL via EPIDURAL

## 2017-09-22 MED ORDER — SIMETHICONE 80 MG PO CHEW: 80.0000 mg | CHEWABLE_TABLET | ORAL | Status: DC | PRN

## 2017-09-22 MED ORDER — DIBUCAINE 1 % RE OINT: 1.0000 "application " | TOPICAL_OINTMENT | RECTAL | Status: DC | PRN

## 2017-09-22 MED ORDER — OXYTOCIN BOLUS FROM INFUSION
500.0000 mL | Freq: Once | INTRAVENOUS | Status: AC
  Administered 2017-09-22: 500 mL via INTRAVENOUS

## 2017-09-22 MED ORDER — PHENYLEPHRINE 40 MCG/ML (10ML) SYRINGE FOR IV PUSH (FOR BLOOD PRESSURE SUPPORT)
80.0000 ug | PREFILLED_SYRINGE | INTRAVENOUS | Status: DC | PRN
  Filled 2017-09-22: qty 10
  Filled 2017-09-22: qty 5

## 2017-09-22 MED ORDER — PHENYLEPHRINE 40 MCG/ML (10ML) SYRINGE FOR IV PUSH (FOR BLOOD PRESSURE SUPPORT)
80.0000 ug | PREFILLED_SYRINGE | INTRAVENOUS | Status: DC | PRN
  Filled 2017-09-22: qty 5

## 2017-09-22 MED ORDER — LACTATED RINGERS IV SOLN
500.0000 mL | Freq: Once | INTRAVENOUS | Status: AC
  Administered 2017-09-22: 500 mL via INTRAVENOUS

## 2017-09-22 MED ORDER — ONDANSETRON HCL 4 MG PO TABS: 4.0000 mg | ORAL_TABLET | ORAL | Status: DC | PRN

## 2017-09-22 NOTE — H&P (Signed)
Kristen CoderKayla N Davidson is a 27 y.o. female presenting for elective IOL.  Antepartum course complicated by asthma; stable this pregnancy. Patient received one dose of VMP last night.  She was 3-4 cm at last check and pitocin was started.  She just received epidural a few minutes ago.  GBS negative.  OB History    Gravida  3   Para  1   Term  1   Preterm  0   AB  1   Living  1     SAB  1   TAB  0   Ectopic  0   Multiple  0   Live Births  1          Past Medical History:  Diagnosis Date  . Abnormal Pap smear   . Allergy   . Asthma   . Headache(784.0)   . PONV (postoperative nausea and vomiting)   . Vaginal Pap smear, abnormal    Past Surgical History:  Procedure Laterality Date  . CHOLECYSTECTOMY N/A 10/09/2012   Procedure: LAPAROSCOPIC CHOLECYSTECTOMY WITH INTRAOPERATIVE CHOLANGIOGRAM;  Surgeon: Kandis Cockingavid H Newman, MD;  Location: [facility] OR;  Service: General;  Laterality: N/A;  . COLPOSCOPY    . TONSILLECTOMY    . WISDOM TOOTH EXTRACTION     Family History: family history includes Anemia in her mother; Anxiety disorder in her maternal grandfather; Cancer in her paternal grandfather and paternal grandmother; Diabetes in her maternal aunt and maternal grandfather; Heart disease in her father, maternal grandfather, and mother; Hypertension in her father and mother; Kidney disease in her maternal aunt; Lupus in her mother; Migraines in her mother; Rheum arthritis in her mother; Thyroid disease in her maternal grandmother and mother. Social History:  reports that she has never smoked. She has never used smokeless tobacco. She reports that she drank alcohol. She reports that she does not use drugs.     Maternal Diabetes: No Genetic Screening: Normal Maternal Ultrasounds/Referrals: Normal Fetal Ultrasounds or other Referrals:  None Maternal Substance Abuse:  No Significant Maternal Medications:  None Significant Maternal Lab Results:  Lab values include: Group B Strep negative Other  Comments:  None  ROS Maternal Medical History:  Fetal activity: Perceived fetal activity is normal.   Last perceived fetal movement was within the past hour.    Prenatal complications: no prenatal complications Prenatal Complications - Diabetes: none.    Dilation: 3.5 Effacement (%): 60 Station: -3 Exam by:: Southern CompanySavannah Brendle RN Blood pressure 129/87, pulse 69, temperature 97.9 F (36.6 C), temperature source Oral, resp. rate 20, height 5\' 7"  (1.702 m), weight 205 lb 12.8 oz (93.4 kg), last menstrual period 04/23/2016, SpO2 98 %. Maternal Exam:  Uterine Assessment: Contraction strength is moderate.  Contraction frequency is regular.   Abdomen: Patient reports no abdominal tenderness. Fundal height is c/w dates.   Estimated fetal weight is 8#.       Physical Exam  Constitutional: She is oriented to person, place, and time. She appears well-developed and well-nourished.  GI: Soft. There is no rebound and no guarding.  Neurological: She is alert and oriented to person, place, and time.  Skin: Skin is warm and dry.  Psychiatric: She has a normal mood and affect. Her behavior is normal.    Prenatal labs: ABO, Rh: --/--/O POS (06/24 0100) Antibody: NEG (06/24 0100) Rubella: Immune (11/28 0000) RPR: Nonreactive (11/28 0000)  HBsAg: Negative (11/28 0000)  HIV: Non-reactive (06/24 0416)  GBS: Negative (06/11 0000)   Assessment/Plan: 26yo G3P1011 at 3081w3d  for IOL -AROM once CLEA in >20 minutes -Anticipate NSVD   Owen Pratte 09/22/2017, 8:06 AM

## 2017-09-22 NOTE — Lactation Note (Signed)
This note was copied from a baby's chart. Lactation Consultation Note  Patient Name: Kristen Davidson: 09/22/2017 Reason for consult: Initial assessment;Term  P2 mother whose infant is now 638 hours old.  Mother breastfed her first child for 4 months.  She hopes to breastfeed this baby for 1 year.  Mother has a room full of visitors including the pediatrician who is examining the baby.  Mother stated that her first feeding went very well and baby nursed for 20 minutes.  She asked if I could get her a NS.  Mother stated, "I just feel like those help me latch better."  I did not do a breast assessment due to the visitors but asked her to call me back when baby is ready to feed and we could discuss the need for a NS.  Mother will call.  Encouraged feeding 8-12 times/24 hours or more if baby shows feeding cues.  Reviewed feeding cues with mother.  Also encouraged hand expression after feedings.  Will evaluate feeding plan more after visitors are gone.     Maternal Data Formula Feeding for Exclusion: No Has patient been taught Hand Expression?: Yes Does the patient have breastfeeding experience prior to this delivery?: Yes  Feeding Feeding Type: Breast Fed                 Lactation Tools Discussed/Used WIC Program: No   Consult Status Consult Status: Follow-up Davidson: 09/23/17 Follow-up type: In-patient    Dora SimsBeth R Ariauna Farabee 09/22/2017, 7:46 PM

## 2017-09-22 NOTE — Anesthesia Preprocedure Evaluation (Signed)
Anesthesia Evaluation  Patient identified by MRN, date of birth, ID band Patient awake    Reviewed: Allergy & Precautions, NPO status , Patient's Chart, lab work & pertinent test results  History of Anesthesia Complications (+) PONV and history of anesthetic complications  Airway Mallampati: II  TM Distance: >3 FB Neck ROM: Full    Dental  (+) Teeth Intact   Pulmonary asthma ,    Pulmonary exam normal breath sounds clear to auscultation       Cardiovascular negative cardio ROS Normal cardiovascular exam Rhythm:Regular Rate:Normal     Neuro/Psych  Headaches, negative psych ROS   GI/Hepatic Neg liver ROS, GERD  ,  Endo/Other  Obesity  Renal/GU negative Renal ROS  negative genitourinary   Musculoskeletal negative musculoskeletal ROS (+)   Abdominal (+) + obese,   Peds  Hematology negative hematology ROS (+)   Anesthesia Other Findings   Reproductive/Obstetrics (+) Pregnancy                             Anesthesia Physical Anesthesia Plan  ASA: II  Anesthesia Plan: Epidural   Post-op Pain Management:    Induction:   PONV Risk Score and Plan:   Airway Management Planned: Natural Airway  Additional Equipment:   Intra-op Plan:   Post-operative Plan:   Informed Consent: I have reviewed the patients History and Physical, chart, labs and discussed the procedure including the risks, benefits and alternatives for the proposed anesthesia with the patient or authorized representative who has indicated his/her understanding and acceptance.     Plan Discussed with: Anesthesiologist  Anesthesia Plan Comments:         Anesthesia Quick Evaluation

## 2017-09-22 NOTE — Anesthesia Postprocedure Evaluation (Signed)
Anesthesia Post Note  Patient: Kristen Davidson  Procedure(s) Performed: AN AD HOC LABOR EPIDURAL     Patient location during evaluation: Mother Baby Anesthesia Type: Epidural Level of consciousness: awake and alert Pain management: pain level controlled Vital Signs Assessment: post-procedure vital signs reviewed and stable Respiratory status: spontaneous breathing, nonlabored ventilation and respiratory function stable Cardiovascular status: stable Postop Assessment: no headache, no backache and epidural receding Anesthetic complications: no    Last Vitals:  Vitals:   09/22/17 1345 09/22/17 1448  BP: (!) 116/52 114/68  Pulse: 62 85  Resp: 18 17  Temp: 36.6 C   SpO2:  99%    Last Pain:  Vitals:   09/22/17 1448  TempSrc:   PainSc: 3    Pain Goal: Patients Stated Pain Goal: 1 (09/22/17 0046)               Marrion CoyMERRITT,Avanelle Pixley

## 2017-09-22 NOTE — H&P (Signed)
Kristen Davidson is a 27 y.o. female presenting for IOL.  Pregnancy uncomplicated.  S/p cytotec x 1 overnight.  OB History    Gravida  3   Para  1   Term  1   Preterm  0   AB  1   Living  1     SAB  1   TAB  0   Ectopic  0   Multiple  0   Live Births  1          Past Medical History:  Diagnosis Date  . Abnormal Pap smear   . Allergy   . Asthma   . Headache(784.0)   . PONV (postoperative nausea and vomiting)   . Vaginal Pap smear, abnormal    Past Surgical History:  Procedure Laterality Date  . CHOLECYSTECTOMY N/A 10/09/2012   Procedure: LAPAROSCOPIC CHOLECYSTECTOMY WITH INTRAOPERATIVE CHOLANGIOGRAM;  Surgeon: Kandis Cockingavid H Newman, MD;  Location: [facility] OR;  Service: General;  Laterality: N/A;  . COLPOSCOPY    . TONSILLECTOMY    . WISDOM TOOTH EXTRACTION     Family History: family history includes Anemia in her mother; Anxiety disorder in her maternal grandfather; Cancer in her paternal grandfather and paternal grandmother; Diabetes in her maternal aunt and maternal grandfather; Heart disease in her father, maternal grandfather, and mother; Hypertension in her father and mother; Kidney disease in her maternal aunt; Lupus in her mother; Migraines in her mother; Rheum arthritis in her mother; Thyroid disease in her maternal grandmother and mother. Social History:  reports that she has never smoked. She has never used smokeless tobacco. She reports that she drank alcohol. She reports that she does not use drugs.     Maternal Diabetes: No Genetic Screening: Normal Maternal Ultrasounds/Referrals: Normal Fetal Ultrasounds or other Referrals:  None Maternal Substance Abuse:  No Significant Maternal Medications:  None Significant Maternal Lab Results:  None Other Comments:  None  ROS History Dilation: 3.5 Effacement (%): 60 Station: -3 Exam by:: [employer]annah Brendle RN Blood pressure 121/74, pulse 65, temperature 98 F (36.7 C), temperature source Oral, resp. rate 18, last  menstrual period 04/23/2016. Exam Physical Exam  Gen - NAD Abd - gravid, NT Ext - NT Cvx 3.5cm by RN exam Prenatal labs: ABO, Rh: --/--/O POS (06/24 0100) Antibody: NEG (06/24 0100) Rubella: Immune (11/28 0000) RPR: Nonreactive (11/28 0000)  HBsAg: Negative (11/28 0000)  HIV: Non-reactive (06/24 0416)  GBS: Negative (06/11 0000)   Assessment/Plan: Continue IOL - start pitocin Epidural prn  Kristen Davidson 09/22/2017, 6:28 AM

## 2017-09-22 NOTE — Anesthesia Pain Management Evaluation Note (Signed)
  [facility] Pain Management Visit Note  Patient: Kristen Davidson, 27 y.o., female  "Hello I am a member of the anesthesia team at [facility]. We have an anesthesia team available at all times to provide care throughout the hospital, including epidural management and anesthesia for C-section. I don't know your plan for the delivery whether it a natural birth, water birth, IV sedation, nitrous supplementation, doula or epidural, but we want to meet your pain goals."   1.Was your pain managed to your expectations on prior hospitalizations?   Yes   2.What is your expectation for pain management during this hospitalization?     Epidural  3.How can we help you reach that goal?   Record the patient's initial score and the patient's pain goal.   Pain: 0  Pain Goal: 8 [facility] wants you to be able to say your pain was always managed very well.  Kristen Davidson,Kristen Davidson 09/22/2017

## 2017-09-22 NOTE — Anesthesia Procedure Notes (Signed)
Epidural Patient location during procedure: OB Start time: 09/22/2017 7:52 AM  Staffing Anesthesiologist: Mal AmabileFoster, Bryer Cozzolino, MD Performed: anesthesiologist   Preanesthetic Checklist Completed: patient identified, site marked, surgical consent, pre-op evaluation, timeout performed, IV checked, risks and benefits discussed and monitors and equipment checked  Epidural Patient position: sitting Prep: site prepped and draped and DuraPrep Patient monitoring: continuous pulse ox and blood pressure Approach: midline Location: L3-L4 Injection technique: LOR air  Needle:  Needle type: Tuohy  Needle gauge: 17 G Needle length: 9 cm and 9 Needle insertion depth: 5 cm cm Catheter type: closed end flexible Catheter size: 19 Gauge Catheter at skin depth: 10 cm Test dose: negative and Other  Assessment Events: blood not aspirated, injection not painful, no injection resistance, negative IV test and no paresthesia  Additional Notes Patient identified. Risks and benefits discussed including failed block, incomplete  Pain control, post dural puncture headache, nerve damage, paralysis, blood pressure Changes, nausea, vomiting, reactions to medications-both toxic and allergic and post Partum back pain. All questions were answered. Patient expressed understanding and wished to proceed. Sterile technique was used throughout procedure. Epidural site was Dressed with sterile barrier dressing. No paresthesias, signs of intravascular injection Or signs of intrathecal spread were encountered.  Patient was more comfortable after the epidural was dosed. Please see RN's note for documentation of vital signs and FHR which are stable.

## 2017-09-23 LAB — CBC
HCT: 32.6 % — ABNORMAL LOW (ref 36.0–46.0)
Hemoglobin: 10.7 g/dL — ABNORMAL LOW (ref 12.0–15.0)
MCH: 28.8 pg (ref 26.0–34.0)
MCHC: 32.8 g/dL (ref 30.0–36.0)
MCV: 87.9 fL (ref 78.0–100.0)
Platelets: 253 10*3/uL (ref 150–400)
RBC: 3.71 MIL/uL — ABNORMAL LOW (ref 3.87–5.11)
RDW: 15.1 % (ref 11.5–15.5)
WBC: 12 10*3/uL — ABNORMAL HIGH (ref 4.0–10.5)

## 2017-09-23 MED ORDER — HYDROCORTISONE ACE-PRAMOXINE 1-1 % RE FOAM
1.0000 | Freq: Two times a day (BID) | RECTAL | Status: DC
  Filled 2017-09-23: qty 10

## 2017-09-23 NOTE — Progress Notes (Signed)
Patient doing well No complaints  BP 108/61 (BP Location: Right Arm)   Pulse 69   Temp 97.8 F (36.6 C) (Oral)   Resp 16   Ht 5\' 7"  (1.702 m)   Wt 93.4 kg (205 lb 12.8 oz)   LMP 04/23/2016   SpO2 99%   Breastfeeding? Unknown   BMI 32.23 kg/m  Results for orders placed or performed during the hospital encounter of 09/22/17 (from the past 24 hour(s))  CBC     Status: Abnormal   Collection Time: 09/23/17  6:05 AM  Result Value Ref Range   WBC 12.0 (H) 4.0 - 10.5 K/uL   RBC 3.71 (L) 3.87 - 5.11 MIL/uL   Hemoglobin 10.7 (L) 12.0 - 15.0 g/dL   HCT 98.132.6 (L) 19.136.0 - 47.846.0 %   MCV 87.9 78.0 - 100.0 fL   MCH 28.8 26.0 - 34.0 pg   MCHC 32.8 30.0 - 36.0 g/dL   RDW 29.515.1 62.111.5 - 30.815.5 %   Platelets 253 150 - 400 K/uL   Abdomen is soft and non tender  Lochia WNL  PPD # 1  Doing well Routine care Discharge home tomorrow

## 2017-09-24 NOTE — Discharge Summary (Signed)
Obstetric Discharge Summary Reason for Admission: induction of labor Prenatal Procedures: none Intrapartum Procedures: spontaneous vaginal delivery Postpartum Procedures: none Complications-Operative and Postpartum: 1 degree perineal laceration Hemoglobin  Date Value Ref Range Status  09/23/2017 10.7 (L) 12.0 - 15.0 g/dL Final  16/10/960412/18/2017 54.013.1 11.1 - 15.9 g/dL Final   HCT  Date Value Ref Range Status  09/23/2017 32.6 (L) 36.0 - 46.0 % Final   Hematocrit  Date Value Ref Range Status  03/18/2016 39.7 34.0 - 46.6 % Final    Physical Exam:  General: alert, cooperative, appears stated age and no distress Lochia: appropriate Uterine Fundus: firm Incision: healing well DVT Evaluation: No evidence of DVT seen on physical exam.  Discharge Diagnoses: Term Pregnancy-delivered  Discharge Information: Date: 09/24/2017 Activity: pelvic rest Diet: routine Medications: None Condition: stable Instructions: refer to practice specific booklet Discharge to: home   Newborn Data: Live born female  Birth Weight: 7 lb 1.1 oz (3206 g) APGAR: 9, 9  Newborn Delivery   Birth date/time:  09/22/2017 11:43:00 Delivery type:  Vaginal, Spontaneous     Home with mother.  Kristen Davidson 09/24/2017, 9:44 AM

## 2017-09-24 NOTE — Lactation Note (Signed)
This note was copied from a baby's chart. Lactation Consultation Note  Patient Name: Kristen Davidson ZOXWR'UToday's Date: 09/24/2017 Reason for consult: Follow-up assessment;Difficult latch;Term  P2 mother whose infant is now 6737 hours old.  Mother is requesting latch assistance.  Baby is crying and very fussy as I entered the room.  Offereed to assist with latch and mother accepted.  Once I calmed baby by allowing her to suck on my gloved finger she was able to latch onto the right breast without difficulty.  Baby sucking well and fast at breast with many loud audible swallows.  She had to self release intermittently due to strong flow from sucking.  Reviewed breast compressions during feeds with mother.  Mother's breast much softer after infant fed.  Burped baby after 10 minutes and latched onto the left breast without difficulty.  Allowed mother to demonstrate latching without assistance and she did well.  A few attempts she stated that the latch hurt so I asked her to relatch and with continued practice mother was able to obtain a better latch.  She stated this feed was the best the baby has had and her pain is gone.  I reviewed how to obtain and maintain an effective latch.  Father present and very interested in learning.  Parents had many questions which I answered to their satisfaction.  Mother will call as needed for assistance.  RN updated.   Maternal Data Formula Feeding for Exclusion: No Has patient been taught Hand Expression?: Yes Does the patient have breastfeeding experience prior to this delivery?: Yes  Feeding Feeding Type: Breast Fed Length of feed: 20 min(still feeding when I left the room)  LATCH Score Latch: Grasps breast easily, tongue down, lips flanged, rhythmical sucking.  Audible Swallowing: Spontaneous and intermittent  Type of Nipple: Everted at rest and after stimulation  Comfort (Breast/Nipple): Soft / non-tender  Hold (Positioning): Assistance needed to  correctly position infant at breast and maintain latch.  LATCH Score: 9  Interventions Interventions: Breast feeding basics reviewed;Assisted with latch;Skin to skin;Breast massage;Hand express;Position options;Support pillows;Adjust position;Breast compression  Lactation Tools Discussed/Used     Consult Status Consult Status: Follow-up Date: 09/25/17 Follow-up type: In-patient    Pilar Corrales R Demetrie Borge 09/24/2017, 1:32 AM

## 2017-09-24 NOTE — Lactation Note (Signed)
This note was copied from a baby's chart. Lactation Consultation Note  Patient Name: Kristen Kristen Davidson Reason for consult: Follow-up assessment;Infant weight loss;Nipple pain/trauma(7% weight loss / Bili at 35 hours 3.7 ) Baby is 46 hours old.  7% weight loss,  As LC entered room, mom holding baby and baby asleep.  During the consult baby woke up and was rooting, LC assisted to latch  And showed dad how he could assist to latch with depth, at 1st per mom pinching and improved  With breast compression/ multiple swallows noted and still feeding at 14 mins.  LC reviewed sore nipple and engorgement prevention and tx.  LC assessed both breast with moms permission prior to latching/ no break  down noted/ no engorgement/  Right beast soften ( and baby last fed at 8:30 for 20 mins ) , left breast just full/ some areola edema.  LC recommended prior to latch breast massage, hand express, pre-pump if needed ( has hand pump )  And reverse pressure. LC reviewed hand expressing and milk flows easily. After feeding EBM to nipples/ cold comfort gels / when warm / switch to a dab of coconut oil and shells between  Feedings except when sleeping. Mom has coconut oil , comfort gels, hand pump, and shells for D/C.  Per mom has a DEBP Medela at home.  Mother informed of post-discharge support and given phone number to the lactation department, including services for phone call assistance; out-patient appointments; and breastfeeding support group. List of other breastfeeding resources in the community given in the handout. Encouraged mother to call for problems or concerns related to breastfeeding.   Maternal Data Has patient been taught Hand Expression?: Yes  Feeding Feeding Type: Breast Fed Length of feed: (still feeding at 14 mins, multiple swallows/ increased with breast compressions )  LATCH Score  Latch: Grasps breast easily, tongue down, lips flanged, rhythmical  sucking.  Audible Swallowing: Spontaneous and intermittent  Type of Nipple: Everted at rest and after stimulation  Comfort (Breast/Nipple): Filling, red/small blisters or bruises, mild/mod discomfort  Hold (Positioning): Assistance needed to correctly position infant at breast and maintain latch.  LATCH Score: 8  Interventions Interventions: Breast feeding basics reviewed;Assisted with latch;Skin to skin;Breast massage;Hand express;Reverse pressure;Breast compression;Adjust position;Support pillows;Position options;Expressed milk;Shells;Comfort gels;Coconut oil;Hand pump  Lactation Tools Discussed/Used Tools: Shells;Pump;Coconut oil;Flanges;Comfort gels Flange Size: 24;27;Other (comment)(#27 F for when milk comes in due to some areola edema ) Shell Type: Inverted Breast pump type: Manual Pump Review: Setup, frequency, and cleaning;Milk Storage Initiated by:: MAI  Date initiated:: 09/24/17   Consult Status Consult Status: Complete Date: 09/24/17 Follow-up type: In-patient    Kristen Davidson Davidson, 9:50 AM

## 2018-01-04 ENCOUNTER — Emergency Department (HOSPITAL_BASED_OUTPATIENT_CLINIC_OR_DEPARTMENT_OTHER)
Admission: EM | Admit: 2018-01-04 | Discharge: 2018-01-04 | Disposition: A | Discharge status: Home / Self Care | Payer: BLUE CROSS/BLUE SHIELD | Attending: Emergency Medicine | Admitting: Emergency Medicine

## 2018-01-04 ENCOUNTER — Encounter (HOSPITAL_BASED_OUTPATIENT_CLINIC_OR_DEPARTMENT_OTHER): Payer: Self-pay | Admitting: Emergency Medicine

## 2018-01-04 ENCOUNTER — Other Ambulatory Visit: Payer: Self-pay

## 2018-01-04 DIAGNOSIS — W4904XA Ring or other jewelry causing external constriction, initial encounter: Secondary | ICD-10-CM | POA: Diagnosis not present

## 2018-01-04 DIAGNOSIS — Y999 Unspecified external cause status: Secondary | ICD-10-CM | POA: Insufficient documentation

## 2018-01-04 DIAGNOSIS — J45909 Unspecified asthma, uncomplicated: Secondary | ICD-10-CM | POA: Insufficient documentation

## 2018-01-04 DIAGNOSIS — Y939 Activity, unspecified: Secondary | ICD-10-CM | POA: Diagnosis not present

## 2018-01-04 DIAGNOSIS — Y929 Unspecified place or not applicable: Secondary | ICD-10-CM | POA: Diagnosis not present

## 2018-01-04 DIAGNOSIS — S60445A External constriction of left ring finger, initial encounter: Secondary | ICD-10-CM | POA: Diagnosis not present

## 2018-01-04 NOTE — ED Provider Notes (Signed)
[facility] EMERGENCY DEPARTMENT Provider Note   CSN: [id number] Arrival date & time: 01/04/18  0604     History   Chief Complaint Chief Complaint  Patient presents with  . ring stuck on finger    HPI BREIANA STRATMANN is a 27 y.o. female.  HPI  This is a 27 year old female who presents with her wedding ring stuck on her left ring finger.  Patient reports that she woke up with swelling of the left hand.  She occasionally swells at night.  She was able to get her engagement ring off but was unable to get her band off.  She used ice, cold water, oil at home with minimal relief.  She had increasing swelling and pain.  Denies numbness or tingling of the finger.  Past Medical History:  Diagnosis Date  . Abnormal Pap smear   . Allergy   . Asthma   . Headache(784.0)   . PONV (postoperative nausea and vomiting)   . Vaginal Pap smear, abnormal     Patient Active Problem List   Diagnosis Date Noted  . Pregnancy 09/22/2017  . H. pylori infection 08/18/2012  . Chronic back pain 08/18/2012    Past Surgical History:  Procedure Laterality Date  . CHOLECYSTECTOMY N/A 10/09/2012   Procedure: LAPAROSCOPIC CHOLECYSTECTOMY WITH INTRAOPERATIVE CHOLANGIOGRAM;  Surgeon: Kandis Cocking, MD;  Location: [facility] OR;  Service: General;  Laterality: N/A;  . COLPOSCOPY    . TONSILLECTOMY    . WISDOM TOOTH EXTRACTION       OB History    Gravida  3   Para  2   Term  2   Preterm  0   AB  1   Living  2     SAB  1   TAB  0   Ectopic  0   Multiple  0   Live Births  2            Home Medications    Prior to Admission medications   Medication Sig Start Date End Date Taking? Authorizing Provider  albuterol (PROVENTIL HFA;VENTOLIN HFA) 108 (90 Base) MCG/ACT inhaler Inhale 2 puffs into the lungs every 4 (four) hours as needed for wheezing or shortness of breath. 05/17/16   Sherren Mocha, MD  Prenatal Vit-Fe Fumarate-FA (PRENATAL MULTIVITAMIN) TABS tablet Take 1 tablet by  mouth daily at 12 noon.    [provider]    Family History Family History  Problem Relation Age of Onset  . Heart disease Maternal Grandfather   . Diabetes Maternal Grandfather   . Anxiety disorder Maternal Grandfather   . Hypertension Mother   . Heart disease Mother        PVCs  . Anemia Mother   . Thyroid disease Mother   . Migraines Mother   . Lupus Mother   . Rheum arthritis Mother   . Hypertension Father   . Heart disease Father   . Diabetes Maternal Aunt   . Kidney disease Maternal Aunt   . Thyroid disease Maternal Grandmother   . Cancer Paternal Grandmother        lymphoma  . Cancer Paternal Grandfather        prostate  . Anesthesia problems Neg Hx   . Hypotension Neg Hx   . Malignant hyperthermia Neg Hx   . Pseudochol deficiency Neg Hx     Social History Social History   Tobacco Use  . Smoking status: Never Smoker  . Smokeless tobacco: Never Used  Substance Use Topics  . Alcohol use: Not Currently    Alcohol/week: 0.0 standard drinks    Comment: rarely  . Drug use: No     Allergies   Patient has no known allergies.   Review of Systems Review of Systems  Musculoskeletal:       Finger pain and swelling  Skin: Positive for color change and wound.  All other systems reviewed and are negative.    Physical Exam Updated Vital Signs Pulse 80   Resp 19   Ht 1.702 m (5\' 7" )   Wt 81.6 kg   SpO2 98%   BMI 28.19 kg/m   Physical Exam  Constitutional: She is oriented to person, place, and time. She appears well-developed and well-nourished.  HENT:  Head: Normocephalic and atraumatic.  Cardiovascular: Normal rate and regular rhythm.  Pulmonary/Chest: Effort normal. No respiratory distress.  Musculoskeletal:  Slight swelling and erythema with irritation noted over the proximal aspect of the left fourth digit, wedding band in place, good capillary refill  Neurological: She is alert and oriented to person, place, and time.  Skin: Skin is  warm and dry.  Psychiatric: She has a normal mood and affect.  Nursing note and vitals reviewed.    ED Treatments / Results  Labs (all labs ordered are listed, but only abnormal results are displayed) Labs Reviewed - No data to display  EKG None  Radiology No results found.  Procedures Procedures (including critical care time)  Medications Ordered in ED Medications - No data to display   Initial Impression / Assessment and Plan / ED Course  I have reviewed the triage vital signs and the nursing notes.  Pertinent labs & imaging results that were available during my care of the patient were reviewed by me and considered in my medical decision making (see chart for details).     Patient presents with swelling and irritation of her left ring finger.  Ring was removed with ring cutters.  She is neurovascularly intact.  After history, exam, and medical workup I feel the patient has been appropriately medically screened and is safe for discharge home. Pertinent diagnoses were discussed with the patient. Patient was given return precautions.   Final Clinical Impressions(s) / ED Diagnoses   Final diagnoses:  Ring or other jewelry causing external constriction, initial encounter    ED Discharge Orders    None       Nyisha Clippard, Mayer Masker, MD 01/04/18 [phone]

## 2018-01-04 NOTE — ED Notes (Signed)
Pt understood dc material. NAD noted. 

## 2018-01-04 NOTE — ED Triage Notes (Signed)
Pt has wedding band stuck on L ring finger. Finger swollen above ring.

## 2018-01-04 NOTE — ED Notes (Signed)
Ring removed with ring cutter. Skin intact.

## 2018-08-06 ENCOUNTER — Other Ambulatory Visit: Payer: Self-pay | Admitting: *Deleted

## 2018-08-06 DIAGNOSIS — R002 Palpitations: Secondary | ICD-10-CM

## 2018-08-07 ENCOUNTER — Telehealth: Payer: Self-pay | Admitting: Radiology

## 2018-08-07 NOTE — Telephone Encounter (Signed)
Enrolled patient for a 14 day Zio long term monitor to be mailed. Brief instructions were gone over with the patient and she knows to expect the monitor in 3-4 days. **Primary care dr ordered for 48hrs monitor was switched to a 3 day Zio for mailing purposes. Patient asked if she could wear longer if she hasnt had symptoms in 3 days.

## 2018-08-12 ENCOUNTER — Telehealth: Payer: Self-pay | Admitting: *Deleted

## 2018-08-12 ENCOUNTER — Ambulatory Visit (INDEPENDENT_AMBULATORY_CARE_PROVIDER_SITE_OTHER): Payer: BLUE CROSS/BLUE SHIELD

## 2018-08-12 DIAGNOSIS — R002 Palpitations: Secondary | ICD-10-CM

## 2018-08-12 NOTE — Telephone Encounter (Signed)
REFERRAL AND NOTES SENT TO SCHEDULING AND NOTES ON FILE FROM DR. Farris Has [phone].

## 2018-09-08 ENCOUNTER — Other Ambulatory Visit: Payer: Self-pay

## 2019-03-24 LAB — OB RESULTS CONSOLE ABO/RH: RH Type: POSITIVE

## 2019-03-24 LAB — OB RESULTS CONSOLE GC/CHLAMYDIA
Chlamydia: NEGATIVE
Gonorrhea: NEGATIVE

## 2019-03-24 LAB — OB RESULTS CONSOLE RUBELLA ANTIBODY, IGM: Rubella: IMMUNE

## 2019-03-24 LAB — OB RESULTS CONSOLE HEPATITIS B SURFACE ANTIGEN: Hepatitis B Surface Ag: NEGATIVE

## 2019-03-24 LAB — OB RESULTS CONSOLE ANTIBODY SCREEN: Antibody Screen: NEGATIVE

## 2019-03-24 LAB — OB RESULTS CONSOLE HIV ANTIBODY (ROUTINE TESTING): HIV: NONREACTIVE

## 2019-03-24 LAB — OB RESULTS CONSOLE RPR: RPR: NONREACTIVE

## 2019-05-06 ENCOUNTER — Encounter (HOSPITAL_COMMUNITY): Payer: Self-pay | Admitting: Obstetrics and Gynecology

## 2019-05-06 ENCOUNTER — Inpatient Hospital Stay (HOSPITAL_COMMUNITY)
Admission: AD | Admit: 2019-05-06 | Discharge: 2019-05-06 | Disposition: A | Discharge status: Home / Self Care | Payer: BC Managed Care – PPO | Attending: Obstetrics and Gynecology | Admitting: Obstetrics and Gynecology

## 2019-05-06 ENCOUNTER — Other Ambulatory Visit: Payer: Self-pay

## 2019-05-06 DIAGNOSIS — O99512 Diseases of the respiratory system complicating pregnancy, second trimester: Secondary | ICD-10-CM | POA: Diagnosis not present

## 2019-05-06 DIAGNOSIS — Z03818 Encounter for observation for suspected exposure to other biological agents ruled out: Secondary | ICD-10-CM

## 2019-05-06 DIAGNOSIS — Z3A16 16 weeks gestation of pregnancy: Secondary | ICD-10-CM | POA: Insufficient documentation

## 2019-05-06 DIAGNOSIS — J069 Acute upper respiratory infection, unspecified: Secondary | ICD-10-CM

## 2019-05-06 DIAGNOSIS — R05 Cough: Secondary | ICD-10-CM

## 2019-05-06 DIAGNOSIS — R0602 Shortness of breath: Secondary | ICD-10-CM | POA: Diagnosis present

## 2019-05-06 DIAGNOSIS — Z20822 Contact with and (suspected) exposure to covid-19: Secondary | ICD-10-CM | POA: Insufficient documentation

## 2019-05-06 DIAGNOSIS — J4521 Mild intermittent asthma with (acute) exacerbation: Secondary | ICD-10-CM | POA: Diagnosis not present

## 2019-05-06 DIAGNOSIS — J4530 Mild persistent asthma, uncomplicated: Secondary | ICD-10-CM

## 2019-05-06 DIAGNOSIS — R059 Cough, unspecified: Secondary | ICD-10-CM

## 2019-05-06 MED ORDER — ALBUTEROL SULFATE HFA 108 (90 BASE) MCG/ACT IN AERS: 2.0000 | INHALATION_SPRAY | RESPIRATORY_TRACT | 2 refills | Status: DC | PRN

## 2019-05-06 MED ORDER — PULMICORT FLEXHALER 90 MCG/ACT IN AEPB: 2.0000 | INHALATION_SPRAY | Freq: Two times a day (BID) | RESPIRATORY_TRACT | 0 refills | Status: DC

## 2019-05-06 NOTE — MAU Provider Note (Signed)
Chief Complaint: Shortness of Breath, Cough, Nasal Congestion, and Sore Throat   First Provider Initiated Contact with Patient 05/06/19 1323      SUBJECTIVE HPI: Kristen Davidson is a 29 y.o. [id number] at [redacted]w[redacted]d who presents to maternity admissions reporting 4 day hx of cough, congestion, and sore throat with 24 hours of diarrhea and shortness of breath. She denies fever/chills.  Her SOB is worsening which is why she came in. She was evaluated at Urgent Care 2 days ago and had negative rapid COVID test with PCR pending.  She also has hx of COVID positive with symptoms in July.  She has hx of mild intermittent asthma, requiring only occasional use of albuterol inhaler.  In the last 2 days, she has used her inhaler 3-4 times per day but her SOB is still getting worse.   Urgent Care told her not to take any OTC cold medications without talking to her OB doctor and when she called the office she was told that most everything was safe to take but she was afraid to choose without a specific list.  So, she has not taken any medications except her inhaler, which is almost empty.  She denies any OB complaints.    HPI  Past Medical History:  Diagnosis Date  . Abnormal Pap smear   . Allergy   . Asthma   . Headache(784.0)   . PONV (postoperative nausea and vomiting)   . Vaginal Pap smear, abnormal    Past Surgical History:  Procedure Laterality Date  . CHOLECYSTECTOMY N/A 10/09/2012   Procedure: LAPAROSCOPIC CHOLECYSTECTOMY WITH INTRAOPERATIVE CHOLANGIOGRAM;  Surgeon: Kandis Cocking, MD;  Location: [facility] OR;  Service: General;  Laterality: N/A;  . COLPOSCOPY    . TONSILLECTOMY    . WISDOM TOOTH EXTRACTION     Social History   Socioeconomic History  . Marital status: Married    Spouse name: Not on file  . Number of children: Not on file  . Years of education: Not on file  . Highest education level: Not on file  Occupational History  . Not on file  Tobacco Use  . Smoking status: Never Smoker  .  Smokeless tobacco: Never Used  Substance and Sexual Activity  . Alcohol use: Not Currently    Alcohol/week: 0.0 standard drinks    Comment: rarely  . Drug use: No  . Sexual activity: Yes  Other Topics Concern  . Not on file  Social History Narrative  . Not on file   Social Determinants of Health   Financial Resource Strain:   . Difficulty of Paying Living Expenses: Not on file  Food Insecurity:   . Worried About Programme researcher, broadcasting/film/video in the Last Year: Not on file  . Ran Out of Food in the Last Year: Not on file  Transportation Needs:   . Lack of Transportation (Medical): Not on file  . Lack of Transportation (Non-Medical): Not on file  Physical Activity:   . Days of Exercise per Week: Not on file  . Minutes of Exercise per Session: Not on file  Stress:   . Feeling of Stress : Not on file  Social Connections:   . Frequency of Communication with Friends and Family: Not on file  . Frequency of Social Gatherings with Friends and Family: Not on file  . Attends Religious Services: Not on file  . Active Member of Clubs or Organizations: Not on file  . Attends Banker Meetings: Not on file  .  Marital Status: Not on file  Intimate Partner Violence:   . Fear of Current or Ex-Partner: Not on file  . Emotionally Abused: Not on file  . Physically Abused: Not on file  . Sexually Abused: Not on file   No current facility-administered medications on file prior to encounter.   Current Outpatient Medications on File Prior to Encounter  Medication Sig Dispense Refill  . albuterol (PROVENTIL HFA;VENTOLIN HFA) 108 (90 Base) MCG/ACT inhaler Inhale 2 puffs into the lungs every 4 (four) hours as needed for wheezing or shortness of breath. 1 Inhaler 1  . Prenatal Vit-Fe Fumarate-FA (PRENATAL MULTIVITAMIN) TABS tablet Take 1 tablet by mouth daily at 12 noon.     No Known Allergies  ROS:  Review of Systems  Constitutional: Negative for chills, fatigue and fever.  HENT: Positive  for congestion and rhinorrhea.   Respiratory: Positive for cough, chest tightness and shortness of breath.   Cardiovascular: Negative for chest pain.  Gastrointestinal: Positive for abdominal pain and diarrhea. Negative for nausea and vomiting.  Genitourinary: Negative for difficulty urinating, dysuria, flank pain, pelvic pain, vaginal bleeding, vaginal discharge and vaginal pain.  Neurological: Negative for dizziness and headaches.  Psychiatric/Behavioral: Negative.      I have reviewed patient's Past Medical Hx, Surgical Hx, Family Hx, Social Hx, medications and allergies.   Physical Exam   Patient Vitals for the past 24 hrs:  BP Temp Temp src Pulse Resp SpO2 Height Weight  05/06/19 1312 (!) 106/48 98.9 F (37.2 C) Oral (!) 109 20 98 % 5\' 5"  (1.651 m) 84.7 kg  05/06/19 1307 -- -- -- -- -- 98 % -- --   Constitutional: Well-developed, well-nourished female in no acute distress.  HEART: normal rate with mild tachycardia when talking/moving around, returns to 99-100 at rest, normal heart sounds, regular rhythm RESP: normal effort, lung sounds clear and equal bilaterally GI: Abd soft, non-tender. Pos BS x 4 MS: Extremities nontender, no edema, normal ROM Neurologic: Alert and oriented x 4.  GU: Neg CVAT.  FHT 164 by doppler  LAB RESULTS No results found for this or any previous visit (from the past 24 hour(s)).     IMAGING No results found.  MAU Management/MDM: Orders Placed This Encounter  Procedures  . Discharge patient    Meds ordered this encounter  Medications  . Budesonide (PULMICORT FLEXHALER) 90 MCG/ACT inhaler    Sig: Inhale 2 puffs into the lungs 2 (two) times daily.    Dispense:  1 each    Refill:  0    Order Specific Question:   Supervising Provider    Answer:   CONSTANT, PEGGY [[id number]]    Recommend that pt go to [facility] for further evaluation of shortness of breath with URI in pregnancy.  Pt did not want to go to ED. She has stable VS, including 99-100% O2 on  RA in MAU.  She has not taken any medications except her albuterol inhaler because she was not sure what was safe. Lung sounds are clear bilaterally.  This is most c/w asthma exacerbation with URI during pregnancy.  Although I recommend further evaluation today in the ED, I offered to prescribe an inhaled glucocorticoid and discussed safe OTC medications with pt including Mucinex and saline nasal spray.  Pt to try steroid and OTC medications but if SOB worsens, or does not improve, she should return to [facility]  She can return to MAU for obstetric emergencies.   Pt states understanding and will return if symptoms do  not improve.  ASSESSMENT 1. Mild intermittent asthma with exacerbation   2. Acute upper respiratory infection   3. Lab test negative for COVID-19 virus   4. Pregnancy with 16 completed weeks gestation     PLAN Discharge home Allergies as of 05/06/2019   No Known Allergies     Medication List    TAKE these medications   albuterol 108 (90 Base) MCG/ACT inhaler Commonly known as: VENTOLIN HFA Inhale 2 puffs into the lungs every 4 (four) hours as needed for wheezing or shortness of breath.   prenatal multivitamin Tabs tablet Take 1 tablet by mouth daily at 12 noon.   Pulmicort Flexhaler 90 MCG/ACT inhaler Generic drug: Budesonide Inhale 2 puffs into the lungs 2 (two) times daily.      Follow-up Information    Dian Queen, MD Follow up.   Specialty: Obstetrics and Gynecology Why: As scheduled, return to MAU as needed for emergencies. Contact information: [address] [place] [address] [phone]           Fatima Blank Certified Nurse-Midwife 05/06/2019  1:53 PM

## 2019-05-06 NOTE — MAU Note (Addendum)
Mon, started with congestion in her head and her throat felt raw.  Tues, she was sent to get tested, neg strep, neg covid. Called OB to ask what she could take.  This morning, felt like she couldn't breath or catch her breath. Called ob again and was told to go to ER. Non-productive cough, does feel drainage at the back of her throat. A lot of sneezing.   One long episode of diarrhea yesterday "couldn't get off the toilet".  Ate pizza later, kept it down and no further diarrhea.  No GI problems today.  Has asthma and has been using her inhaler more.

## 2019-07-22 ENCOUNTER — Encounter (HOSPITAL_COMMUNITY): Payer: Self-pay | Admitting: Obstetrics and Gynecology

## 2019-07-22 ENCOUNTER — Inpatient Hospital Stay (HOSPITAL_COMMUNITY)
Admission: AD | Admit: 2019-07-22 | Discharge: 2019-07-22 | Disposition: A | Discharge status: Home / Self Care | Payer: BC Managed Care – PPO | Attending: Obstetrics and Gynecology | Admitting: Obstetrics and Gynecology

## 2019-07-22 ENCOUNTER — Other Ambulatory Visit: Payer: Self-pay

## 2019-07-22 DIAGNOSIS — O212 Late vomiting of pregnancy: Secondary | ICD-10-CM | POA: Insufficient documentation

## 2019-07-22 DIAGNOSIS — O99612 Diseases of the digestive system complicating pregnancy, second trimester: Secondary | ICD-10-CM | POA: Insufficient documentation

## 2019-07-22 DIAGNOSIS — Z3A27 27 weeks gestation of pregnancy: Secondary | ICD-10-CM | POA: Diagnosis not present

## 2019-07-22 DIAGNOSIS — K529 Noninfective gastroenteritis and colitis, unspecified: Secondary | ICD-10-CM | POA: Diagnosis not present

## 2019-07-22 DIAGNOSIS — Z3689 Encounter for other specified antenatal screening: Secondary | ICD-10-CM

## 2019-07-22 LAB — CBC WITH DIFFERENTIAL/PLATELET
Abs Immature Granulocytes: 0.12 10*3/uL — ABNORMAL HIGH (ref 0.00–0.07)
Basophils Absolute: 0 10*3/uL (ref 0.0–0.1)
Basophils Relative: 0 %
Eosinophils Absolute: 0 10*3/uL (ref 0.0–0.5)
Eosinophils Relative: 0 %
HCT: 37.3 % (ref 36.0–46.0)
Hemoglobin: 12.4 g/dL (ref 12.0–15.0)
Immature Granulocytes: 1 %
Lymphocytes Relative: 2 %
Lymphs Abs: 0.5 10*3/uL — ABNORMAL LOW (ref 0.7–4.0)
MCH: 30 pg (ref 26.0–34.0)
MCHC: 33.2 g/dL (ref 30.0–36.0)
MCV: 90.1 fL (ref 80.0–100.0)
Monocytes Absolute: 0.7 10*3/uL (ref 0.1–1.0)
Monocytes Relative: 3 %
Neutro Abs: 19.8 10*3/uL — ABNORMAL HIGH (ref 1.7–7.7)
Neutrophils Relative %: 94 %
Platelets: 258 10*3/uL (ref 150–400)
RBC: 4.14 MIL/uL (ref 3.87–5.11)
RDW: 12.4 % (ref 11.5–15.5)
WBC: 21.2 10*3/uL — ABNORMAL HIGH (ref 4.0–10.5)
nRBC: 0 % (ref 0.0–0.2)

## 2019-07-22 LAB — URINALYSIS, ROUTINE W REFLEX MICROSCOPIC
Bilirubin Urine: NEGATIVE
Glucose, UA: NEGATIVE mg/dL
Hgb urine dipstick: NEGATIVE
Ketones, ur: 80 mg/dL — AB
Nitrite: NEGATIVE
Protein, ur: 100 mg/dL — AB
Specific Gravity, Urine: 1.031 — ABNORMAL HIGH (ref 1.005–1.030)
pH: 5 (ref 5.0–8.0)

## 2019-07-22 LAB — COMPREHENSIVE METABOLIC PANEL
ALT: 17 U/L (ref 0–44)
AST: 19 U/L (ref 15–41)
Albumin: 2.7 g/dL — ABNORMAL LOW (ref 3.5–5.0)
Alkaline Phosphatase: 159 U/L — ABNORMAL HIGH (ref 38–126)
Anion gap: 11 (ref 5–15)
BUN: 11 mg/dL (ref 6–20)
CO2: 21 mmol/L — ABNORMAL LOW (ref 22–32)
Calcium: 8 mg/dL — ABNORMAL LOW (ref 8.9–10.3)
Chloride: 100 mmol/L (ref 98–111)
Creatinine, Ser: 0.55 mg/dL (ref 0.44–1.00)
GFR calc Af Amer: >60 mL/min (ref >60)
GFR calc non Af Amer: >60 mL/min (ref >60)
Glucose, Bld: 96 mg/dL (ref 70–99)
Potassium: 3.7 mmol/L (ref 3.5–5.1)
Sodium: 132 mmol/L — ABNORMAL LOW (ref 135–145)
Total Bilirubin: 0.5 mg/dL (ref 0.3–1.2)
Total Protein: 7 g/dL (ref 6.5–8.1)

## 2019-07-22 LAB — FETAL FIBRONECTIN: Fetal Fibronectin: NEGATIVE

## 2019-07-22 MED ORDER — LACTATED RINGERS IV BOLUS
1000.0000 mL | Freq: Once | INTRAVENOUS | Status: AC
  Administered 2019-07-22: 1000 mL via INTRAVENOUS

## 2019-07-22 MED ORDER — FAMOTIDINE 20 MG PO TABS: 20.0000 mg | ORAL_TABLET | Freq: Every day | ORAL | 0 refills | Status: DC

## 2019-07-22 MED ORDER — ONDANSETRON 8 MG PO TBDP: 8.0000 mg | ORAL_TABLET | Freq: Three times a day (TID) | ORAL | 0 refills | Status: AC | PRN

## 2019-07-22 MED ORDER — FAMOTIDINE IN NACL 20-0.9 MG/50ML-% IV SOLN
20.0000 mg | Freq: Once | INTRAVENOUS | Status: AC
  Administered 2019-07-22: 20 mg via INTRAVENOUS
  Filled 2019-07-22: qty 50

## 2019-07-22 MED ORDER — SODIUM CHLORIDE 0.9 % IV SOLN
8.0000 mg | Freq: Once | INTRAVENOUS | Status: AC
  Administered 2019-07-22: 8 mg via INTRAVENOUS
  Filled 2019-07-22: qty 4

## 2019-07-22 NOTE — Discharge Instructions (Signed)
Abdominal Pain During Pregnancy  Abdominal pain is common during pregnancy, and has many possible causes. Some causes are more serious than others, and sometimes the cause is not known. Abdominal pain can be a sign that labor is starting. It can also be caused by normal growth and stretching of muscles and ligaments during pregnancy. Always tell your health care provider if you have any abdominal pain. Follow these instructions at home:  Do not have sex or put anything in your vagina until your pain goes away completely.  Get plenty of rest until your pain improves.  Drink enough fluid to keep your urine pale yellow.  Take over-the-counter and prescription medicines only as told by your health care provider.  Keep all follow-up visits as told by your health care provider. This is important. Contact a health care provider if:  Your pain continues or gets worse after resting.  You have lower abdominal pain that: ? Comes and goes at regular intervals. ? Spreads to your back. ? Is similar to menstrual cramps.  You have pain or burning when you urinate. Get help right away if:  You have a fever or chills.  You have vaginal bleeding.  You are leaking fluid from your vagina.  You are passing tissue from your vagina.  You have vomiting or diarrhea that lasts for more than 24 hours.  Your baby is moving less than usual.  You feel very weak or faint.  You have shortness of breath.  You develop severe pain in your upper abdomen. Summary  Abdominal pain is common during pregnancy, and has many possible causes.  If you experience abdominal pain during pregnancy, tell your health care provider right away.  Follow your health care provider's home care instructions and keep all follow-up visits as directed. This information is not intended to replace advice given to you by your health care provider. Make sure you discuss any questions you have with your health care  provider. Document Revised: 07/06/2018 Document Reviewed: 06/20/2016 Elsevier Patient Education  2020 [employer]  Viral Gastroenteritis, Adult  Viral gastroenteritis is also known as the stomach flu. This condition may affect your stomach, small intestine, and large intestine. It can cause sudden watery diarrhea, fever, and vomiting. This condition is caused by many different viruses. These viruses can be passed from person to person very easily (are contagious). Diarrhea and vomiting can make you feel weak and cause you to become dehydrated. You may not be able to keep fluids down. Dehydration can make you tired and thirsty, cause you to have a dry mouth, and decrease how often you urinate. It is important to replace the fluids that you lose from diarrhea and vomiting. What are the causes? Gastroenteritis is caused by many viruses, including rotavirus and norovirus. Norovirus is the most common cause in adults. You can get sick after being exposed to the viruses from other people. You can also get sick by:  Eating food, drinking water, or touching a surface contaminated with one of these viruses.  Sharing utensils or other personal items with an infected person. What increases the risk? You are more likely to develop this condition if you:  Have a weak body defense system (immune system).  Live with one or more children who are younger than 35 years old.  Live in a nursing home.  Travel on cruise ships. What are the signs or symptoms? Symptoms of this condition start suddenly 1-3 days after exposure to a virus. Symptoms may last for  a few days or for as long as a week. Common symptoms include watery diarrhea and vomiting. Other symptoms include:  Fever.  Headache.  Fatigue.  Pain in the abdomen.  Chills.  Weakness.  Nausea.  Muscle aches.  Loss of appetite. How is this diagnosed? This condition is diagnosed with a medical history and physical exam. You may also have a  stool test to check for viruses or other infections. How is this treated? This condition typically goes away on its own. The focus of treatment is to prevent dehydration and restore lost fluids (rehydration). This condition may be treated with:  An oral rehydration solution (ORS) to replace important salts and minerals (electrolytes) in your body. Take this if told by your health care provider. This is a drink that is sold at pharmacies and retail stores.  Medicines to help with your symptoms.  Probiotic supplements to reduce symptoms of diarrhea.  Fluids given through an IV, if dehydration is severe. Older adults and people with other diseases or a weak immune system are at higher risk for dehydration. Follow these instructions at home:  Eating and drinking   Take an ORS as told by your health care provider.  Drink clear fluids in small amounts as you are able. Clear fluids include: ? Water. ? Ice chips. ? Diluted fruit juice. ? Low-calorie sports drinks.  Drink enough fluid to keep your urine pale yellow.  Eat small amounts of healthy foods every 3-4 hours as you are able. This may include whole grains, fruits, vegetables, lean meats, and yogurt.  Avoid fluids that contain a lot of sugar or caffeine, such as energy drinks, sports drinks, and soda.  Avoid spicy or fatty foods.  Avoid alcohol. General instructions  Wash your hands often, especially after having diarrhea or vomiting. If soap and water are not available, use hand sanitizer.  Make sure that all people in your household wash their hands well and often.  Take over-the-counter and prescription medicines only as told by your health care provider.  Rest at home while you recover.  Watch your condition for any changes.  Take a warm bath to relieve any burning or pain from frequent diarrhea episodes.  Keep all follow-up visits as told by your health care provider. This is important. Contact a health care  provider if you:  Cannot keep fluids down.  Have symptoms that get worse.  Have new symptoms.  Feel light-headed or dizzy.  Have muscle cramps. Get help right away if you:  Have chest pain.  Feel extremely weak or you faint.  See blood in your vomit.  Have vomit that looks like coffee grounds.  Have bloody or black stools or stools that look like tar.  Have a severe headache, a stiff neck, or both.  Have a rash.  Have severe pain, cramping, or bloating in your abdomen.  Have trouble breathing or you are breathing very quickly.  Have a fast heartbeat.  Have skin that feels cold and clammy.  Feel confused.  Have pain when you urinate.  Have signs of dehydration, such as: ? Dark urine, very little urine, or no urine. ? Cracked lips. ? Dry mouth. ? Sunken eyes. ? Sleepiness. ? Weakness. Summary  Viral gastroenteritis is also known as the stomach flu. It can cause sudden watery diarrhea, fever, and vomiting.  This condition can be passed from person to person very easily (is contagious).  Take an ORS if told by your health care provider. This is a  drink that is sold at pharmacies and retail stores.  Wash your hands often, especially after having diarrhea or vomiting. If soap and water are not available, use hand sanitizer. This information is not intended to replace advice given to you by your health care provider. Make sure you discuss any questions you have with your health care provider. Document Revised: 09/04/2018 Document Reviewed: 01/21/2018 Elsevier Patient Education  2020 [employer].  Preterm Labor and Birth Information  The normal length of a pregnancy is 39-41 weeks. Preterm labor is when labor starts before 37 completed weeks of pregnancy. What are the risk factors for preterm labor? Preterm labor is more likely to occur in women who:  Have certain infections during pregnancy such as a bladder infection, sexually transmitted infection, or  infection inside the uterus (chorioamnionitis).  Have a shorter-than-normal cervix.  Have gone into preterm labor before.  Have had surgery on their cervix.  Are younger than age 52 or older than age 19.  Are African American.  Are pregnant with twins or multiple babies (multiple gestation).  Take street drugs or smoke while pregnant.  Do not gain enough weight while pregnant.  Became pregnant shortly after having been pregnant. What are the symptoms of preterm labor? Symptoms of preterm labor include:  Cramps similar to those that can happen during a menstrual period. The cramps may happen with diarrhea.  Pain in the abdomen or lower back.  Regular uterine contractions that may feel like tightening of the abdomen.  A feeling of increased pressure in the pelvis.  Increased watery or bloody mucus discharge from the vagina.  Water breaking (ruptured amniotic sac). Why is it important to recognize signs of preterm labor? It is important to recognize signs of preterm labor because babies who are born prematurely may not be fully developed. This can put them at an increased risk for:  Long-term (chronic) heart and lung problems.  Difficulty immediately after birth with regulating body systems, including blood sugar, body temperature, heart rate, and breathing rate.  Bleeding in the brain.  Cerebral palsy.  Learning difficulties.  Death. These risks are highest for babies who are born before 34 weeks of pregnancy. How is preterm labor treated? Treatment depends on the length of your pregnancy, your condition, and the health of your baby. It may involve:  Having a stitch (suture) placed in your cervix to prevent your cervix from opening too early (cerclage).  Taking or being given medicines, such as: ? Hormone medicines. These may be given early in pregnancy to help support the pregnancy. ? Medicine to stop contractions. ? Medicines to help mature the baby's lungs.  These may be prescribed if the risk of delivery is high. ? Medicines to prevent your baby from developing cerebral palsy. If the labor happens before 34 weeks of pregnancy, you may need to stay in the hospital. What should I do if I think I am in preterm labor? If you think that you are going into preterm labor, call your health care provider right away. How can I prevent preterm labor in future pregnancies? To increase your chance of having a full-term pregnancy:  Do not use any tobacco products, such as cigarettes, chewing tobacco, and e-cigarettes. If you need help quitting, ask your health care provider.  Do not use street drugs or medicines that have not been prescribed to you during your pregnancy.  Talk with your health care provider before taking any herbal supplements, even if you have been taking them regularly.  Make sure you gain a healthy amount of weight during your pregnancy.  Watch for infection. If you think that you might have an infection, get it checked right away.  Make sure to tell your health care provider if you have gone into preterm labor before. This information is not intended to replace advice given to you by your health care provider. Make sure you discuss any questions you have with your health care provider. Document Revised: 07/10/2018 Document Reviewed: 08/09/2015 Elsevier Patient Education  2020 [employer].

## 2019-07-22 NOTE — MAU Provider Note (Signed)
History     CSN: [id number]  Arrival date and time: 07/22/19 1750   None     Chief Complaint  Patient presents with  . Dehydration  . Emesis   Ms. Kristen Davidson is a 29 y.o. [phone] at [redacted]w[redacted]d who presents to MAU for nausea, vomiting and diarrhea. Patient's husband and daughter are also ill with these symptoms. Patient reports one of her children started daycare today and they received a letter today stating that there was a virus going around. Patient reports she went to Urgent Care who required a COVID test and flu test prior to seeing her, and when those results came back negative, patient was told she could not be seen because Urgent Care could not monitor her baby. Patient reports last intercourse one month ago.  Onset: 1230 Location: stomach Duration: <24hrs Character: vomited x12+, intermittent nausea, diarrhea x5-6 Aggravating/Associated: none/none Relieving: none Treatment: none  Pt denies VB, LOF, ctx, decreased FM, vaginal discharge/odor/itching. Pt denies abdominal pain, constipation, diarrhea, or urinary problems. Pt denies fever, chills, fatigue, sweating or changes in appetite. Pt denies SOB or chest pain. Pt denies dizziness, HA, light-headedness, weakness.  Problems this pregnancy include: recurrent UTIs. Allergies? NKDA Current medications/supplements? Inhaler, PNVs Prenatal care provider? [facility], next Monday 07/26/2019   OB History    Gravida  4   Para  2   Term  2   Preterm  0   AB  1   Living  2     SAB  1   TAB  0   Ectopic  0   Multiple  0   Live Births  2           Past Medical History:  Diagnosis Date  . Abnormal Pap smear   . Allergy   . Asthma   . Headache(784.0)   . PONV (postoperative nausea and vomiting)     Past Surgical History:  Procedure Laterality Date  . CHOLECYSTECTOMY N/A 10/09/2012   Procedure: LAPAROSCOPIC CHOLECYSTECTOMY WITH INTRAOPERATIVE CHOLANGIOGRAM;  Surgeon: Shann Medal, MD;   Location: [facility];  Service: General;  Laterality: N/A;  . COLPOSCOPY    . TONSILLECTOMY    . WISDOM TOOTH EXTRACTION      Family History  Problem Relation Age of Onset  . Heart disease Maternal Grandfather   . Diabetes Maternal Grandfather   . Anxiety disorder Maternal Grandfather   . Hypertension Mother   . Heart disease Mother        PVCs  . Anemia Mother   . Thyroid disease Mother   . Migraines Mother   . Lupus Mother   . Rheum arthritis Mother   . Hypertension Father   . Heart disease Father   . Diabetes Maternal Aunt   . Kidney disease Maternal Aunt   . Thyroid disease Maternal Grandmother   . Cancer Paternal Grandmother        lymphoma  . Cancer Paternal Grandfather        prostate  . Anesthesia problems Neg Hx   . Hypotension Neg Hx   . Malignant hyperthermia Neg Hx   . Pseudochol deficiency Neg Hx     Social History   Tobacco Use  . Smoking status: Never Smoker  . Smokeless tobacco: Never Used  Substance Use Topics  . Alcohol use: Not Currently    Alcohol/week: 0.0 standard drinks    Comment: rarely  . Drug use: No    Allergies: No Known Allergies  Medications Prior  to Admission  Medication Sig Dispense Refill Last Dose  . albuterol (VENTOLIN HFA) 108 (90 Base) MCG/ACT inhaler Inhale 2 puffs into the lungs every 4 (four) hours as needed for wheezing or shortness of breath. 18 g 2 Past Week at Unknown time  . Prenatal Vit-Fe Fumarate-FA (PRENATAL MULTIVITAMIN) TABS tablet Take 1 tablet by mouth daily at 12 noon.   07/21/2019 at Unknown time  . Budesonide (PULMICORT FLEXHALER) 90 MCG/ACT inhaler Inhale 2 puffs into the lungs 2 (two) times daily. 1 each 0 Unknown at Unknown time    Review of Systems  Constitutional: Negative for chills, diaphoresis, fatigue and fever.  Eyes: Negative for visual disturbance.  Respiratory: Negative for shortness of breath.   Cardiovascular: Negative for chest pain.  Gastrointestinal: Positive for diarrhea, nausea and  vomiting. Negative for abdominal pain and constipation.  Genitourinary: Negative for dysuria, flank pain, frequency, pelvic pain, urgency, vaginal bleeding and vaginal discharge.  Neurological: Negative for dizziness, weakness, light-headedness and headaches.   Physical Exam   Blood pressure (!) 122/58, pulse 93, temperature 97.7 F (36.5 C), resp. rate 18, height 5\' 5"  (1.651 m), weight 87.1 kg, SpO2 100 %, unknown if currently breastfeeding.  Patient Vitals for the past 24 hrs:  BP Temp Pulse Resp SpO2 Height Weight  07/22/19 2114 (!) 122/58 -- -- -- -- -- --  07/22/19 1929 114/62 -- 93 -- -- -- --  07/22/19 1905 -- -- -- -- 100 % -- --  07/22/19 1900 -- -- -- -- 100 % -- --  07/22/19 1855 -- -- -- -- 99 % -- --  07/22/19 1830 -- -- -- -- 100 % -- --  07/22/19 1828 (!) 122/56 -- 100 -- -- -- --  07/22/19 1825 -- -- -- -- 100 % -- --  07/22/19 1820 -- -- -- -- 100 % -- --  07/22/19 1818 -- 97.7 F (36.5 C) (!) 106 18 -- -- --  07/22/19 1804 -- -- -- -- -- 5\' 5"  (1.651 m) 87.1 kg   Physical Exam  Constitutional: She is oriented to person, place, and time. She appears well-developed and well-nourished. No distress.  HENT:  Head: Normocephalic and atraumatic.  Respiratory: Effort normal.  GI: Soft.  Genitourinary: There is no rash, tenderness or lesion on the right labia. There is no rash, tenderness or lesion on the left labia.    Genitourinary Comments: CE: long/closed/posterior   Neurological: She is alert and oriented to person, place, and time.  Skin: Skin is warm and dry. She is not diaphoretic.   Results for orders placed or performed during the hospital encounter of 07/22/19 (from the past 24 hour(s))  Urinalysis, Routine w reflex microscopic     Status: Abnormal   Collection Time: 07/22/19  6:22 PM  Result Value Ref Range   Color, Urine AMBER (A) YELLOW   APPearance HAZY (A) CLEAR   Specific Gravity, Urine 1.031 (H) 1.005 - 1.030   pH 5.0 5.0 - 8.0   Glucose, UA  NEGATIVE NEGATIVE mg/dL   Hgb urine dipstick NEGATIVE NEGATIVE   Bilirubin Urine NEGATIVE NEGATIVE   Ketones, ur 80 (A) NEGATIVE mg/dL   Protein, ur 07/24/19 (A) NEGATIVE mg/dL   Nitrite NEGATIVE NEGATIVE   Leukocytes,Ua TRACE (A) NEGATIVE   RBC / HPF 0-5 0 - 5 RBC/hpf   WBC, UA 0-5 0 - 5 WBC/hpf   Bacteria, UA RARE (A) NONE SEEN   Squamous Epithelial / LPF 11-20 0 - 5   Mucus PRESENT  Comprehensive metabolic panel     Status: Abnormal   Collection Time: 07/22/19  6:49 PM  Result Value Ref Range   Sodium 132 (L) 135 - 145 mmol/L   Potassium 3.7 3.5 - 5.1 mmol/L   Chloride 100 98 - 111 mmol/L   CO2 21 (L) 22 - 32 mmol/L   Glucose, Bld 96 70 - 99 mg/dL   BUN 11 6 - 20 mg/dL   Creatinine, Ser 0.17 0.44 - 1.00 mg/dL   Calcium 8.0 (L) 8.9 - 10.3 mg/dL   Total Protein 7.0 6.5 - 8.1 g/dL   Albumin 2.7 (L) 3.5 - 5.0 g/dL   AST 19 15 - 41 U/L   ALT 17 0 - 44 U/L   Alkaline Phosphatase 159 (H) 38 - 126 U/L   Total Bilirubin 0.5 0.3 - 1.2 mg/dL   GFR calc non Af Amer >60 >60 mL/min   GFR calc Af Amer >60 >60 mL/min   Anion gap 11 5 - 15  CBC with Differential/Platelet     Status: Abnormal   Collection Time: 07/22/19  6:49 PM  Result Value Ref Range   WBC 21.2 (H) 4.0 - 10.5 K/uL   RBC 4.14 3.87 - 5.11 MIL/uL   Hemoglobin 12.4 12.0 - 15.0 g/dL   HCT 49.4 49.6 - 75.9 %   MCV 90.1 80.0 - 100.0 fL   MCH 30.0 26.0 - 34.0 pg   MCHC 33.2 30.0 - 36.0 g/dL   RDW 16.3 84.6 - 65.9 %   Platelets 258 150 - 400 K/uL   nRBC 0.0 0.0 - 0.2 %   Neutrophils Relative % 94 %   Neutro Abs 19.8 (H) 1.7 - 7.7 K/uL   Lymphocytes Relative 2 %   Lymphs Abs 0.5 (L) 0.7 - 4.0 K/uL   Monocytes Relative 3 %   Monocytes Absolute 0.7 0.1 - 1.0 K/uL   Eosinophils Relative 0 %   Eosinophils Absolute 0.0 0.0 - 0.5 K/uL   Basophils Relative 0 %   Basophils Absolute 0.0 0.0 - 0.1 K/uL   Immature Granulocytes 1 %   Abs Immature Granulocytes 0.12 (H) 0.00 - 0.07 K/uL  Fetal fibronectin     Status: None    Collection Time: 07/22/19  7:04 PM  Result Value Ref Range   Fetal Fibronectin NEGATIVE NEGATIVE    MAU Course  Procedures  MDM -N/V in pregnancy d/t GI virus circulating in family from daycare -UA: amber/hazy/SG1.031/80 ketones/100PRO/trace leuks/rare bacteria, sending urine for culture -CBC w/Diff: WBCs 21.2 -CMP: no abnormalities requiring treatment -1L LR + 20mg  Pepcid + 8mg  Zofran given, pt reports NV now resolved -pt able to urinate after fluids -PO challenge successful -EFM: reassuring       -baseline: 155       -variability: moderate       -accels: present, 10x10       -decels: absent       -TOCO: initially q3-38min, resolved after fluids -fFN: negative -CE: long/closed/posterior -pt discharged to home in stable condition  Orders Placed This Encounter  Procedures  . Culture, OB Urine    Standing Status:   Standing    Number of Occurrences:   1  . Urinalysis, Routine w reflex microscopic    Standing Status:   Standing    Number of Occurrences:   1  . Comprehensive metabolic panel    Standing Status:   Standing    Number of Occurrences:   1  . CBC with Differential/Platelet  Standing Status:   Standing    Number of Occurrences:   1  . Fetal fibronectin    Standing Status:   Standing    Number of Occurrences:   1  . Contact and Enteric precautions (UV disinfection)    Standing Status:   Standing    Number of Occurrences:   1  . Discharge patient    Order Specific Question:   Discharge disposition    Answer:   01-Home or Self Care [1]    Order Specific Question:   Discharge patient date    Answer:   07/22/2019   Meds ordered this encounter  Medications  . ondansetron (ZOFRAN) 8 mg in sodium chloride 0.9 % 50 mL IVPB  . famotidine (PEPCID) IVPB 20 mg premix  . lactated ringers bolus 1,000 mL  . ondansetron (ZOFRAN ODT) 8 MG disintegrating tablet    Sig: Take 1 tablet (8 mg total) by mouth every 8 (eight) hours as needed for up to 7 days for nausea or  vomiting.    Dispense:  21 tablet    Refill:  0    Order Specific Question:   Supervising Provider    Answer:   Reva Bores [[id number]]  . famotidine (PEPCID) 20 MG tablet    Sig: Take 1 tablet (20 mg total) by mouth daily.    Dispense:  30 tablet    Refill:  0    Order Specific Question:   Supervising Provider    Answer:   Reva Bores [[id number]]    Assessment and Plan   1. Gastroenteritis, acute   2. [redacted] weeks gestation of pregnancy   3. NST (non-stress test) reactive    Allergies as of 07/22/2019   No Known Allergies     Medication List    TAKE these medications   albuterol 108 (90 Base) MCG/ACT inhaler Commonly known as: VENTOLIN HFA Inhale 2 puffs into the lungs every 4 (four) hours as needed for wheezing or shortness of breath.   famotidine 20 MG tablet Commonly known as: PEPCID Take 1 tablet (20 mg total) by mouth daily.   ondansetron 8 MG disintegrating tablet Commonly known as: Zofran ODT Take 1 tablet (8 mg total) by mouth every 8 (eight) hours as needed for up to 7 days for nausea or vomiting.   prenatal multivitamin Tabs tablet Take 1 tablet by mouth daily at 12 noon.   Pulmicort Flexhaler 90 MCG/ACT inhaler Generic drug: Budesonide Inhale 2 puffs into the lungs 2 (two) times daily.      -discussed lab results -PTL precautions given -return MAU precautions given -pt discharged to home in stable condition  Joni Reining E Allisyn Kunz 07/22/2019, 9:29 PM

## 2019-07-22 NOTE — MAU Note (Signed)
Pt was seen at [facility]. Was told they wouldn't see her until she got Covid/flu tests were done. Both are negtive. She was then told they wouldn't see her because they couldn't monitor her baby. She has had nausea/vomiting/diarrhea since around 1230-1300. Family has had GI virus. Patient denies contractions, VB, LOF, +FM.

## 2019-07-22 NOTE — MAU Note (Signed)
Not enough urine for culture tube 

## 2019-09-04 ENCOUNTER — Inpatient Hospital Stay (HOSPITAL_COMMUNITY)
Admission: AD | Admit: 2019-09-04 | Discharge: 2019-09-04 | Disposition: A | Discharge status: Home / Self Care | Payer: BC Managed Care – PPO | Attending: Obstetrics and Gynecology | Admitting: Obstetrics and Gynecology

## 2019-09-04 ENCOUNTER — Other Ambulatory Visit: Payer: Self-pay

## 2019-09-04 DIAGNOSIS — Z79899 Other long term (current) drug therapy: Secondary | ICD-10-CM | POA: Diagnosis not present

## 2019-09-04 DIAGNOSIS — O4703 False labor before 37 completed weeks of gestation, third trimester: Secondary | ICD-10-CM

## 2019-09-04 DIAGNOSIS — O99891 Other specified diseases and conditions complicating pregnancy: Secondary | ICD-10-CM | POA: Insufficient documentation

## 2019-09-04 DIAGNOSIS — N858 Other specified noninflammatory disorders of uterus: Secondary | ICD-10-CM | POA: Insufficient documentation

## 2019-09-04 DIAGNOSIS — Z3A33 33 weeks gestation of pregnancy: Secondary | ICD-10-CM | POA: Diagnosis not present

## 2019-09-04 DIAGNOSIS — Z7951 Long term (current) use of inhaled steroids: Secondary | ICD-10-CM | POA: Diagnosis not present

## 2019-09-04 DIAGNOSIS — Z3689 Encounter for other specified antenatal screening: Secondary | ICD-10-CM

## 2019-09-04 LAB — URINALYSIS, ROUTINE W REFLEX MICROSCOPIC
Bilirubin Urine: NEGATIVE
Glucose, UA: NEGATIVE mg/dL
Hgb urine dipstick: NEGATIVE
Ketones, ur: NEGATIVE mg/dL
Nitrite: NEGATIVE
Protein, ur: 30 mg/dL — AB
Specific Gravity, Urine: 1.029 (ref 1.005–1.030)
pH: 5 (ref 5.0–8.0)

## 2019-09-04 MED ORDER — NIFEDIPINE 10 MG PO CAPS
10.0000 mg | ORAL_CAPSULE | Freq: Once | ORAL | Status: AC
  Administered 2019-09-04: 10 mg via ORAL
  Filled 2019-09-04: qty 1

## 2019-09-04 MED ORDER — TERBUTALINE SULFATE 1 MG/ML IJ SOLN
INTRAMUSCULAR | Status: AC
  Filled 2019-09-04: qty 1

## 2019-09-04 MED ORDER — TERBUTALINE SULFATE 1 MG/ML IJ SOLN
0.2500 mg | Freq: Once | INTRAMUSCULAR | Status: AC
  Administered 2019-09-04: 0.25 mg via SUBCUTANEOUS

## 2019-09-04 MED ORDER — LACTATED RINGERS IV BOLUS
1000.0000 mL | Freq: Once | INTRAVENOUS | Status: AC
  Administered 2019-09-04: 1000 mL via INTRAVENOUS

## 2019-09-04 NOTE — MAU Provider Note (Addendum)
Chief Complaint:  Contractions   First Provider Initiated Contact with Patient 09/04/19 1936      HPI: Kristen Davidson is a 29 y.o. [id number] at [redacted]w[redacted]d who presents to maternity admissions reporting ctx q8m. Ctx started about 1.5 hours ago. She has been on her feet a lot today and has had two baby showers in two days with minimal hydration per FOB. Also reports one episode of diarrhea earlier today but otherwise no concerns. Reports sweating some yesterday.  She reports good fetal movement, denies LOF, vaginal bleeding, vaginal itching/burning, urinary symptoms, h/a, dizziness, n/v, or fever/chills.    Past Medical History: Past Medical History:  Diagnosis Date  . Abnormal Pap smear   . Allergy   . Asthma   . Headache(784.0)   . PONV (postoperative nausea and vomiting)     Past obstetric history: OB History  Gravida Para Term Preterm AB Living  4 2 2  0 1 2  SAB TAB Ectopic Multiple Live Births  1 0 0 0 2    # Outcome Date GA Lbr Len/2nd Weight Sex Delivery Anes PTL Lv  4 Current           3 Term 09/22/17 [redacted]w[redacted]d 05:14 / 00:59 3206 g F Vag-Spont EPI  LIV  2 Term 08/08/11 [redacted]w[redacted]d 06:48 / 02:13 3155 g F Vag-Spont EPI  LIV  1 SAB 2011 [redacted]w[redacted]d           Past Surgical History: Past Surgical History:  Procedure Laterality Date  . CHOLECYSTECTOMY N/A 10/09/2012   Procedure: LAPAROSCOPIC CHOLECYSTECTOMY WITH INTRAOPERATIVE CHOLANGIOGRAM;  Surgeon: 12/10/2012, MD;  Location: [facility];  Service: General;  Laterality: N/A;  . COLPOSCOPY    . TONSILLECTOMY    . WISDOM TOOTH EXTRACTION      Family History: Family History  Problem Relation Age of Onset  . Heart disease Maternal Grandfather   . Diabetes Maternal Grandfather   . Anxiety disorder Maternal Grandfather   . Hypertension Mother   . Heart disease Mother        PVCs  . Anemia Mother   . Thyroid disease Mother   . Migraines Mother   . Lupus Mother   . Rheum arthritis Mother   . Hypertension Father   . Heart disease Father   .  Diabetes Maternal Aunt   . Kidney disease Maternal Aunt   . Thyroid disease Maternal Grandmother   . Cancer Paternal Grandmother        lymphoma  . Cancer Paternal Grandfather        prostate  . Anesthesia problems Neg Hx   . Hypotension Neg Hx   . Malignant hyperthermia Neg Hx   . Pseudochol deficiency Neg Hx     Social History: Social History   Tobacco Use  . Smoking status: Never Smoker  . Smokeless tobacco: Never Used  Substance Use Topics  . Alcohol use: Not Currently    Alcohol/week: 0.0 standard drinks    Comment: rarely  . Drug use: No    Allergies: No Known Allergies  Meds:  Medications Prior to Admission  Medication Sig Dispense Refill Last Dose  . Prenatal Vit-Fe Fumarate-FA (PRENATAL MULTIVITAMIN) TABS tablet Take 1 tablet by mouth daily at 12 noon.   09/03/2019 at Unknown time  . albuterol (VENTOLIN HFA) 108 (90 Base) MCG/ACT inhaler Inhale 2 puffs into the lungs every 4 (four) hours as needed for wheezing or shortness of breath. 18 g 2   . Budesonide (PULMICORT FLEXHALER) 90 MCG/ACT  inhaler Inhale 2 puffs into the lungs 2 (two) times daily. 1 each 0   . famotidine (PEPCID) 20 MG tablet Take 1 tablet (20 mg total) by mouth daily. 30 tablet 0     ROS:  Review of Systems All other systems negative unless noted above in HPI.   I have reviewed patient's Past Medical Hx, Surgical Hx, Family Hx, Social Hx, medications and allergies.   Physical Exam   Patient Vitals for the past 24 hrs:  BP Temp Temp src Pulse Resp SpO2 Height Weight  09/04/19 2200 (!) 108/47 98 F (36.7 C) Oral 78 16 -- -- --  09/04/19 2045 -- -- -- -- -- 100 % -- --  09/04/19 2002 (!) 114/55 -- -- -- -- -- -- --  09/04/19 1936 (!) 117/59 98 F (36.7 C) Oral 81 16 -- 5\' 5"  (1.651 m) 93.9 kg   Constitutional: Well-developed, well-nourished female in no acute distress.  Cardiovascular: normal rate Respiratory: normal effort GI: Abd soft, non-tender, gravid appropriate for gestational age.   MS: Extremities nontender, no edema, normal ROM Neurologic: Alert and oriented x 4.  Psych: Normal mood and affect Bimanual exam: cervix posterior but soft, neg CMT, uterus nontender, nonenlarged, adnexa without tenderness, enlargement, or mass  Dilation: 2 Effacement (%): 50 Station: -2 Presentation: Vertex Exam by:: dr fair  FHT:  Baseline 145, moderate variability, accelerations present, no decelerations Contractions: q 3 mins   Labs: Results for orders placed or performed during the hospital encounter of 09/04/19 (from the past 24 hour(s))  Urinalysis, Routine w reflex microscopic     Status: Abnormal   Collection Time: 09/04/19  7:41 PM  Result Value Ref Range   Color, Urine YELLOW YELLOW   APPearance HAZY (A) CLEAR   Specific Gravity, Urine 1.029 1.005 - 1.030   pH 5.0 5.0 - 8.0   Glucose, UA NEGATIVE NEGATIVE mg/dL   Hgb urine dipstick NEGATIVE NEGATIVE   Bilirubin Urine NEGATIVE NEGATIVE   Ketones, ur NEGATIVE NEGATIVE mg/dL   Protein, ur 30 (A) NEGATIVE mg/dL   Nitrite NEGATIVE NEGATIVE   Leukocytes,Ua SMALL (A) NEGATIVE   RBC / HPF 0-5 0 - 5 RBC/hpf   WBC, UA 0-5 0 - 5 WBC/hpf   Bacteria, UA RARE (A) NONE SEEN   Squamous Epithelial / LPF 11-20 0 - 5   Mucus PRESENT       MAU Course/MDM: Orders Placed This Encounter  Procedures  . OB Urine Culture  . Urinalysis, Routine w reflex microscopic    Meds ordered this encounter  Medications  . lactated ringers bolus 1,000 mL  . terbutaline (BRETHINE) injection 0.25 mg  . terbutaline (BRETHINE) 1 MG/ML injection    11/04/19, Benji   : cabinet override  . NIFEdipine (PROCARDIA) capsule 10 mg     Assessment: 1. Preterm uterine contractions in third trimester, antepartum     Patient presented with preterm contractions. Initially contracting every 2-4 minutes and at times breathing through ctx.   Due to history and likely dehydration, given 1 L bolus of LR.  She was also given one dose of Procardia and  Terbutaline and ctx stopped altogether a few minutes later and patient also reported not feeling ctx anymore. NST reviewed: Reactive as above. TOCO initially with ctx q2-26m and then none after med administration. Discussed possible BMZ should cervix progress but will recheck after a couple hours prior to making that decision. Patient and FOB in agreement with plan. Will hand over to CNM 11m  for further management and disposition.   Barrington Ellison, MD 09/04/2019 8:27 PM  Reassessment of patient @ 2130, no cervical change upon reassessment. Patient reports pain has resolved since completion of medication   Educated and discussed PTL precautions and reasons to return to MAU. Encouraged patient to rest and hydrate over the next 2 days. Work note given to patient to be out of work on Monday.   Discussed reasons to return to MAU. Follow up as scheduled in the office on 6/8. Return to MAU as needed. Pt stable at time of discharge.   Plan: 1. Preterm uterine contractions in third trimester, antepartum   2. NST (non-stress test) reactive   3. [redacted] weeks gestation of pregnancy    Discharge home Follow up as scheduled in the office for prenatal care on 6/8 Return to MAU as needed for reasons discussed and/or emergencies  PTL precautions  Hydration and rest   Follow-up Information    [place] Follow up.   Why: Follow up as scheduled on 6/8 Contact information: [address] [place] [address] [phone]          Allergies as of 09/04/2019   No Known Allergies     Medication List    TAKE these medications   albuterol 108 (90 Base) MCG/ACT inhaler Commonly known as: VENTOLIN HFA Inhale 2 puffs into the lungs every 4 (four) hours as needed for wheezing or shortness of breath.   famotidine 20 MG tablet Commonly known as: PEPCID Take 1 tablet (20 mg total) by mouth daily.   prenatal multivitamin Tabs tablet Take 1 tablet by mouth  daily at 12 noon.   Pulmicort Flexhaler 90 MCG/ACT inhaler Generic drug: Budesonide Inhale 2 puffs into the lungs 2 (two) times daily.       Lajean Manes, CNM 09/04/19, 11:28 PM

## 2019-09-04 NOTE — MAU Note (Addendum)
For the past hour, been having cramps and what feels like - contractions about 10 min apart.  Had loose bowel movement once and was afraid to bear down.  No bleeding. No leaking. Baby moving well. Reports has had baby shower yesterday and today and been on feet a lot.  Has drank maybe cup and half water today and yesterday and yesterday got really hot and sweating yesterday.

## 2019-09-06 LAB — CULTURE, OB URINE: Culture: 20000 — AB

## 2019-09-15 LAB — OB RESULTS CONSOLE RUBELLA ANTIBODY, IGM: Rubella: NON-IMMUNE/NOT IMMUNE

## 2019-09-15 LAB — OB RESULTS CONSOLE GBS: GBS: NEGATIVE

## 2019-09-30 ENCOUNTER — Encounter (HOSPITAL_COMMUNITY): Payer: Self-pay | Admitting: *Deleted

## 2019-09-30 ENCOUNTER — Telehealth (HOSPITAL_COMMUNITY): Payer: Self-pay | Admitting: *Deleted

## 2019-09-30 NOTE — Telephone Encounter (Signed)
Preadmission screen  

## 2019-10-03 ENCOUNTER — Other Ambulatory Visit: Payer: Self-pay

## 2019-10-03 ENCOUNTER — Encounter (HOSPITAL_COMMUNITY): Payer: Self-pay | Admitting: Obstetrics and Gynecology

## 2019-10-03 ENCOUNTER — Inpatient Hospital Stay (HOSPITAL_COMMUNITY)
Admission: AD | Admit: 2019-10-03 | Discharge: 2019-10-03 | Disposition: A | Discharge status: Home / Self Care | Payer: BC Managed Care – PPO | Attending: Obstetrics and Gynecology | Admitting: Obstetrics and Gynecology

## 2019-10-03 DIAGNOSIS — O471 False labor at or after 37 completed weeks of gestation: Secondary | ICD-10-CM | POA: Insufficient documentation

## 2019-10-03 DIAGNOSIS — Z3A39 39 weeks gestation of pregnancy: Secondary | ICD-10-CM | POA: Insufficient documentation

## 2019-10-03 DIAGNOSIS — O479 False labor, unspecified: Secondary | ICD-10-CM

## 2019-10-03 NOTE — MAU Note (Signed)
Kristen Davidson is a 29 y.o. at [redacted]w[redacted]d here in MAU reporting:  +contractions with increase in vaginal pressure and mucus discharge Onset of complaint: started to pick up at 1045 this am Pain score: 3/10 Denies vaginal bleeding or LOF. Endorses being 3cm at her last office visit. Vitals:   10/03/19 1355  BP: 121/65  Pulse: 78  Resp: 17  Temp: 97.8 F (36.6 C)  SpO2: 100%    FHT: 145; +FM Lab orders placed from triage: mau labor triage

## 2019-10-03 NOTE — MAU Note (Signed)
I have communicated with Donette Larry, CNM and reviewed vital signs:  Vitals:   10/03/19 1355 10/03/19 1537  BP: 121/65 104/64  Pulse: 78 72  Resp: 17 17  Temp: 97.8 F (36.6 C)   SpO2: 100% 100%    Vaginal exam:  Dilation: 2.5 Effacement (%): 50 Cervical Position: Posterior Station: -2 Presentation: Vertex Exam by:: Janeth Rase RN,   Also reviewed contraction pattern and that non-stress test is reactive.  It has been documented that patient is contracting every 2-6 minutes with no cervical change over 1.5 hours not indicating active labor.  Patient denies any other complaints.  Based on this report provider has given order for discharge.  A discharge order and diagnosis entered by a provider.   Labor discharge instructions reviewed with patient.

## 2019-10-03 NOTE — Discharge Instructions (Signed)
Braxton Hicks Contractions °Contractions of the uterus can occur throughout pregnancy, but they are not always a sign that you are in labor. You may have practice contractions called Braxton Hicks contractions. These false labor contractions are sometimes confused with true labor. °What are Braxton Hicks contractions? °Braxton Hicks contractions are tightening movements that occur in the muscles of the uterus before labor. Unlike true labor contractions, these contractions do not result in opening (dilation) and thinning of the cervix. Toward the end of pregnancy (32-34 weeks), Braxton Hicks contractions can happen more often and may become stronger. These contractions are sometimes difficult to tell apart from true labor because they can be very uncomfortable. You should not feel embarrassed if you go to the hospital with false labor. °Sometimes, the only way to tell if you are in true labor is for your health care provider to look for changes in the cervix. The health care provider will do a physical exam and may monitor your contractions. If you are not in true labor, the exam should show that your cervix is not dilating and your water has not broken. °If there are no other health problems associated with your pregnancy, it is completely safe for you to be sent home with false labor. You may continue to have Braxton Hicks contractions until you go into true labor. °How to tell the difference between true labor and false labor °True labor °· Contractions last 30-70 seconds. °· Contractions become very regular. °· Discomfort is usually felt in the top of the uterus, and it spreads to the lower abdomen and low back. °· Contractions do not go away with walking. °· Contractions usually become more intense and increase in frequency. °· The cervix dilates and gets thinner. °False labor °· Contractions are usually shorter and not as strong as true labor contractions. °· Contractions are usually irregular. °· Contractions  are often felt in the front of the lower abdomen and in the groin. °· Contractions may go away when you walk around or change positions while lying down. °· Contractions get weaker and are shorter-lasting as time goes on. °· The cervix usually does not dilate or become thin. °Follow these instructions at home: ° °· Take over-the-counter and prescription medicines only as told by your health care provider. °· Keep up with your usual exercises and follow other instructions from your health care provider. °· Eat and drink lightly if you think you are going into labor. °· If Braxton Hicks contractions are making you uncomfortable: °? Change your position from lying down or resting to walking, or change from walking to resting. °? Sit and rest in a tub of warm water. °? Drink enough fluid to keep your urine pale yellow. Dehydration may cause these contractions. °? Do slow and deep breathing several times an hour. °· Keep all follow-up prenatal visits as told by your health care provider. This is important. °Contact a health care provider if: °· You have a fever. °· You have continuous pain in your abdomen. °Get help right away if: °· Your contractions become stronger, more regular, and closer together. °· You have fluid leaking or gushing from your vagina. °· You pass blood-tinged mucus (bloody show). °· You have bleeding from your vagina. °· You have low back pain that you never had before. °· You feel your baby’s head pushing down and causing pelvic pressure. °· Your baby is not moving inside you as much as it used to. °Summary °· Contractions that occur before labor are   called Braxton Hicks contractions, false labor, or practice contractions. °· Braxton Hicks contractions are usually shorter, weaker, farther apart, and less regular than true labor contractions. True labor contractions usually become progressively stronger and regular, and they become more frequent. °· Manage discomfort from Braxton Hicks contractions  by changing position, resting in a warm bath, drinking plenty of water, or practicing deep breathing. °This information is not intended to replace advice given to you by your health care provider. Make sure you discuss any questions you have with your health care provider. °Document Revised: 02/28/2017 Document Reviewed: 08/01/2016 °Elsevier Patient Education © 2020 [employer] Inc. ° °

## 2019-10-04 ENCOUNTER — Telehealth (HOSPITAL_COMMUNITY): Payer: Self-pay | Admitting: *Deleted

## 2019-10-04 NOTE — Telephone Encounter (Signed)
Preadmission screen  

## 2019-10-05 ENCOUNTER — Telehealth (HOSPITAL_COMMUNITY): Payer: Self-pay | Admitting: *Deleted

## 2019-10-05 ENCOUNTER — Encounter (HOSPITAL_COMMUNITY): Payer: Self-pay | Admitting: *Deleted

## 2019-10-05 NOTE — Telephone Encounter (Signed)
Preadmission screen  

## 2019-10-09 ENCOUNTER — Other Ambulatory Visit (HOSPITAL_COMMUNITY)
Admission: RE | Admit: 2019-10-09 | Discharge: 2019-10-09 | Disposition: A | Discharge status: Home / Self Care | Payer: BC Managed Care – PPO | Source: Ambulatory Visit | Attending: Obstetrics and Gynecology | Admitting: Obstetrics and Gynecology

## 2019-10-09 DIAGNOSIS — Z01812 Encounter for preprocedural laboratory examination: Secondary | ICD-10-CM | POA: Insufficient documentation

## 2019-10-09 DIAGNOSIS — Z20822 Contact with and (suspected) exposure to covid-19: Secondary | ICD-10-CM | POA: Insufficient documentation

## 2019-10-09 LAB — SARS CORONAVIRUS 2 (TAT 6-24 HRS): SARS Coronavirus 2: NEGATIVE

## 2019-10-11 NOTE — H&P (Signed)
Kristen Davidson is a 29 y.o. female presenting for IOL at term. Pregnancy complicated by history of anxiety disorder, currently no meds. Also asthma. OB History    Gravida  4   Para  2   Term  2   Preterm  0   AB  1   Living  2     SAB  1   TAB  0   Ectopic  0   Multiple  0   Live Births  2          Past Medical History:  Diagnosis Date  . Abnormal Pap smear   . Allergy   . Anxiety   . Asthma   . Headache(784.0)   . PONV (postoperative nausea and vomiting)    Past Surgical History:  Procedure Laterality Date  . CHOLECYSTECTOMY N/A 10/09/2012   Procedure: LAPAROSCOPIC CHOLECYSTECTOMY WITH INTRAOPERATIVE CHOLANGIOGRAM;  Surgeon: Kandis Cocking, MD;  Location: [facility] OR;  Service: General;  Laterality: N/A;  . COLPOSCOPY    . TONSILLECTOMY    . WISDOM TOOTH EXTRACTION     Family History: family history includes Anemia in her mother; Anxiety disorder in her maternal grandfather; Cancer in her paternal grandfather and paternal grandmother; Diabetes in her maternal aunt and maternal grandfather; Heart disease in her father, maternal grandfather, and mother; Hypertension in her father and mother; Kidney disease in her maternal aunt; Lupus in her mother; Migraines in her mother; Rheum arthritis in her mother; Thyroid disease in her maternal grandmother and mother. Social History:  reports that she has never smoked. She has never used smokeless tobacco. She reports previous alcohol use. She reports that she does not use drugs.     Maternal Diabetes: No Genetic Screening: Normal Maternal Ultrasounds/Referrals: Normal Fetal Ultrasounds or other Referrals:  None Maternal Substance Abuse:  No Significant Maternal Medications:  None Significant Maternal Lab Results:  Group B Strep negative Other Comments:  None  Review of Systems  Constitutional: Negative for fever.  Eyes: Negative for visual disturbance.  Gastrointestinal: Negative for abdominal pain.  Neurological:  Negative for headaches.   Maternal Medical History:  Fetal activity: Perceived fetal activity is normal.        unknown if currently breastfeeding. Maternal Exam:  Abdomen: Fetal presentation: vertex     Physical Exam Cardiovascular:     Rate and Rhythm: Normal rate.  Pulmonary:     Effort: Pulmonary effort is normal.  Abdominal:     Tenderness: There is no abdominal tenderness.     Cx 3/70/-2  Prenatal labs: ABO, Rh: O/Positive/-- (12/23 0000) Antibody: Negative (12/23 0000) Rubella: Nonimmune (06/16 0000) RPR: Nonreactive (12/23 0000)  HBsAg: Negative (12/23 0000)  HIV: Non-reactive (12/23 0000)  GBS:   negative 09/15/19  Assessment/Plan: 29 yo G4P2 at term for IOL   Roselle Locus II 10/11/2019, 9:52 AM

## 2019-10-12 ENCOUNTER — Other Ambulatory Visit: Payer: Self-pay

## 2019-10-12 ENCOUNTER — Inpatient Hospital Stay (HOSPITAL_COMMUNITY): Payer: BC Managed Care – PPO | Admitting: Anesthesiology

## 2019-10-12 ENCOUNTER — Inpatient Hospital Stay (HOSPITAL_COMMUNITY)
Admission: AD | Admit: 2019-10-12 | Discharge: 2019-10-14 | DRG: 807 | Disposition: A | Discharge status: Home / Self Care | Payer: BC Managed Care – PPO | Attending: Obstetrics and Gynecology | Admitting: Obstetrics and Gynecology

## 2019-10-12 ENCOUNTER — Inpatient Hospital Stay (HOSPITAL_COMMUNITY): Payer: BC Managed Care – PPO

## 2019-10-12 ENCOUNTER — Encounter (HOSPITAL_COMMUNITY): Payer: Self-pay | Admitting: Obstetrics and Gynecology

## 2019-10-12 DIAGNOSIS — O99214 Obesity complicating childbirth: Secondary | ICD-10-CM | POA: Diagnosis present

## 2019-10-12 DIAGNOSIS — O99893 Other specified diseases and conditions complicating puerperium: Secondary | ICD-10-CM | POA: Diagnosis not present

## 2019-10-12 DIAGNOSIS — Z3490 Encounter for supervision of normal pregnancy, unspecified, unspecified trimester: Secondary | ICD-10-CM

## 2019-10-12 DIAGNOSIS — Z3A39 39 weeks gestation of pregnancy: Secondary | ICD-10-CM

## 2019-10-12 DIAGNOSIS — R200 Anesthesia of skin: Secondary | ICD-10-CM | POA: Diagnosis not present

## 2019-10-12 DIAGNOSIS — E669 Obesity, unspecified: Secondary | ICD-10-CM | POA: Diagnosis present

## 2019-10-12 DIAGNOSIS — O26893 Other specified pregnancy related conditions, third trimester: Secondary | ICD-10-CM | POA: Diagnosis present

## 2019-10-12 DIAGNOSIS — Z20822 Contact with and (suspected) exposure to covid-19: Secondary | ICD-10-CM | POA: Diagnosis present

## 2019-10-12 LAB — TYPE AND SCREEN
ABO/RH(D): O POS
Antibody Screen: NEGATIVE

## 2019-10-12 LAB — CBC
HCT: 32.3 % — ABNORMAL LOW (ref 36.0–46.0)
Hemoglobin: 10.4 g/dL — ABNORMAL LOW (ref 12.0–15.0)
MCH: 28 pg (ref 26.0–34.0)
MCHC: 32.2 g/dL (ref 30.0–36.0)
MCV: 87.1 fL (ref 80.0–100.0)
Platelets: 303 10*3/uL (ref 150–400)
RBC: 3.71 MIL/uL — ABNORMAL LOW (ref 3.87–5.11)
RDW: 14.2 % (ref 11.5–15.5)
WBC: 13.9 10*3/uL — ABNORMAL HIGH (ref 4.0–10.5)
nRBC: 0 % (ref 0.0–0.2)

## 2019-10-12 LAB — RPR: RPR Ser Ql: NONREACTIVE

## 2019-10-12 MED ORDER — DIBUCAINE (PERIANAL) 1 % EX OINT: 1.0000 "application " | TOPICAL_OINTMENT | CUTANEOUS | Status: DC | PRN

## 2019-10-12 MED ORDER — ZOLPIDEM TARTRATE 5 MG PO TABS: 5.0000 mg | ORAL_TABLET | Freq: Every evening | ORAL | Status: DC | PRN

## 2019-10-12 MED ORDER — SIMETHICONE 80 MG PO CHEW: 80.0000 mg | CHEWABLE_TABLET | ORAL | Status: DC | PRN

## 2019-10-12 MED ORDER — FENTANYL-BUPIVACAINE-NACL 0.5-0.125-0.9 MG/250ML-% EP SOLN
12.0000 mL/h | EPIDURAL | Status: DC | PRN
  Filled 2019-10-12: qty 250

## 2019-10-12 MED ORDER — OXYTOCIN-SODIUM CHLORIDE 30-0.9 UT/500ML-% IV SOLN
1.0000 m[IU]/min | INTRAVENOUS | Status: DC
  Administered 2019-10-12: 2 m[IU]/min via INTRAVENOUS
  Filled 2019-10-12: qty 500

## 2019-10-12 MED ORDER — DIPHENHYDRAMINE HCL 50 MG/ML IJ SOLN: 12.5000 mg | INTRAMUSCULAR | Status: DC | PRN

## 2019-10-12 MED ORDER — TERBUTALINE SULFATE 1 MG/ML IJ SOLN: 0.2500 mg | Freq: Once | INTRAMUSCULAR | Status: DC | PRN

## 2019-10-12 MED ORDER — LIDOCAINE HCL (PF) 1 % IJ SOLN
INTRAMUSCULAR | Status: DC | PRN
  Administered 2019-10-12: 5 mL via EPIDURAL
  Administered 2019-10-12: 4 mL via EPIDURAL

## 2019-10-12 MED ORDER — OXYCODONE-ACETAMINOPHEN 5-325 MG PO TABS: 2.0000 | ORAL_TABLET | ORAL | Status: DC | PRN

## 2019-10-12 MED ORDER — CALCIUM CARBONATE ANTACID 500 MG PO CHEW
2.0000 | CHEWABLE_TABLET | Freq: Once | ORAL | Status: AC
  Administered 2019-10-12: 400 mg via ORAL
  Filled 2019-10-12: qty 2

## 2019-10-12 MED ORDER — ACETAMINOPHEN 325 MG PO TABS
650.0000 mg | ORAL_TABLET | ORAL | Status: DC | PRN
  Administered 2019-10-13: 650 mg via ORAL
  Filled 2019-10-12: qty 2

## 2019-10-12 MED ORDER — PRENATAL MULTIVITAMIN CH
1.0000 | ORAL_TABLET | Freq: Every day | ORAL | Status: DC
  Administered 2019-10-13 – 2019-10-14 (×2): 1 via ORAL
  Filled 2019-10-12: qty 1

## 2019-10-12 MED ORDER — TETANUS-DIPHTH-ACELL PERTUSSIS 5-2.5-18.5 LF-MCG/0.5 IM SUSP: 0.5000 mL | Freq: Once | INTRAMUSCULAR | Status: DC

## 2019-10-12 MED ORDER — SENNOSIDES-DOCUSATE SODIUM 8.6-50 MG PO TABS
2.0000 | ORAL_TABLET | ORAL | Status: DC
  Administered 2019-10-13: 2 via ORAL
  Filled 2019-10-12 (×2): qty 2

## 2019-10-12 MED ORDER — FLEET ENEMA 7-19 GM/118ML RE ENEM: 1.0000 | ENEMA | RECTAL | Status: DC | PRN

## 2019-10-12 MED ORDER — SODIUM CHLORIDE (PF) 0.9 % IJ SOLN
INTRAMUSCULAR | Status: DC | PRN
  Administered 2019-10-12: 12 mL/h via EPIDURAL

## 2019-10-12 MED ORDER — ALBUTEROL SULFATE (2.5 MG/3ML) 0.083% IN NEBU: 3.0000 mL | INHALATION_SOLUTION | RESPIRATORY_TRACT | Status: DC | PRN

## 2019-10-12 MED ORDER — WITCH HAZEL-GLYCERIN EX PADS: 1.0000 "application " | MEDICATED_PAD | CUTANEOUS | Status: DC | PRN

## 2019-10-12 MED ORDER — LACTATED RINGERS IV SOLN: INTRAVENOUS | Status: DC

## 2019-10-12 MED ORDER — HYDROXYZINE HCL 50 MG PO TABS: 50.0000 mg | ORAL_TABLET | Freq: Four times a day (QID) | ORAL | Status: DC | PRN

## 2019-10-12 MED ORDER — ONDANSETRON HCL 4 MG/2ML IJ SOLN: 4.0000 mg | Freq: Four times a day (QID) | INTRAMUSCULAR | Status: DC | PRN

## 2019-10-12 MED ORDER — PHENYLEPHRINE 40 MCG/ML (10ML) SYRINGE FOR IV PUSH (FOR BLOOD PRESSURE SUPPORT)
80.0000 ug | PREFILLED_SYRINGE | INTRAVENOUS | Status: DC | PRN
  Filled 2019-10-12: qty 10

## 2019-10-12 MED ORDER — BENZOCAINE-MENTHOL 20-0.5 % EX AERO
1.0000 "application " | INHALATION_SPRAY | CUTANEOUS | Status: DC | PRN
  Administered 2019-10-12: 1 via TOPICAL
  Filled 2019-10-12: qty 56

## 2019-10-12 MED ORDER — IBUPROFEN 600 MG PO TABS
600.0000 mg | ORAL_TABLET | Freq: Four times a day (QID) | ORAL | Status: DC
  Administered 2019-10-12 – 2019-10-14 (×8): 600 mg via ORAL
  Filled 2019-10-12 (×7): qty 1

## 2019-10-12 MED ORDER — LACTATED RINGERS IV SOLN
500.0000 mL | Freq: Once | INTRAVENOUS | Status: AC
  Administered 2019-10-12: 500 mL via INTRAVENOUS

## 2019-10-12 MED ORDER — FENTANYL CITRATE (PF) 100 MCG/2ML IJ SOLN: 50.0000 ug | INTRAMUSCULAR | Status: DC | PRN

## 2019-10-12 MED ORDER — OXYCODONE HCL 5 MG PO TABS: 10.0000 mg | ORAL_TABLET | ORAL | Status: DC | PRN

## 2019-10-12 MED ORDER — LIDOCAINE HCL (PF) 1 % IJ SOLN: 30.0000 mL | INTRAMUSCULAR | Status: DC | PRN

## 2019-10-12 MED ORDER — DIPHENHYDRAMINE HCL 25 MG PO CAPS: 25.0000 mg | ORAL_CAPSULE | Freq: Four times a day (QID) | ORAL | Status: DC | PRN

## 2019-10-12 MED ORDER — OXYTOCIN BOLUS FROM INFUSION
333.0000 mL | Freq: Once | INTRAVENOUS | Status: AC
  Administered 2019-10-12: 333 mL via INTRAVENOUS

## 2019-10-12 MED ORDER — SOD CITRATE-CITRIC ACID 500-334 MG/5ML PO SOLN: 30.0000 mL | ORAL | Status: DC | PRN

## 2019-10-12 MED ORDER — LACTATED RINGERS IV SOLN: 500.0000 mL | INTRAVENOUS | Status: DC | PRN

## 2019-10-12 MED ORDER — ACETAMINOPHEN 325 MG PO TABS: 650.0000 mg | ORAL_TABLET | ORAL | Status: DC | PRN

## 2019-10-12 MED ORDER — OXYCODONE HCL 5 MG PO TABS: 5.0000 mg | ORAL_TABLET | ORAL | Status: DC | PRN

## 2019-10-12 MED ORDER — OXYCODONE-ACETAMINOPHEN 5-325 MG PO TABS: 1.0000 | ORAL_TABLET | ORAL | Status: DC | PRN

## 2019-10-12 MED ORDER — EPHEDRINE 5 MG/ML INJ: 10.0000 mg | INTRAVENOUS | Status: DC | PRN

## 2019-10-12 MED ORDER — OXYTOCIN-SODIUM CHLORIDE 30-0.9 UT/500ML-% IV SOLN: 2.5000 [IU]/h | INTRAVENOUS | Status: DC

## 2019-10-12 MED ORDER — ONDANSETRON HCL 4 MG PO TABS: 4.0000 mg | ORAL_TABLET | ORAL | Status: DC | PRN

## 2019-10-12 MED ORDER — ONDANSETRON HCL 4 MG/2ML IJ SOLN: 4.0000 mg | INTRAMUSCULAR | Status: DC | PRN

## 2019-10-12 MED ORDER — COCONUT OIL OIL: 1.0000 "application " | TOPICAL_OIL | Status: DC | PRN

## 2019-10-12 MED ORDER — PHENYLEPHRINE 40 MCG/ML (10ML) SYRINGE FOR IV PUSH (FOR BLOOD PRESSURE SUPPORT): 80.0000 ug | PREFILLED_SYRINGE | INTRAVENOUS | Status: DC | PRN

## 2019-10-12 NOTE — Anesthesia Procedure Notes (Signed)
Epidural Patient location during procedure: OB Start time: 10/12/2019 10:54 AM End time: 10/12/2019 10:57 AM  Staffing Anesthesiologist: Beryle Lathe, MD Performed: anesthesiologist   Preanesthetic Checklist Completed: patient identified, IV checked, risks and benefits discussed, monitors and equipment checked, pre-op evaluation and timeout performed  Epidural Patient position: sitting Prep: DuraPrep Patient monitoring: continuous pulse ox and blood pressure Approach: midline Location: L2-L3 Injection technique: LOR saline  Needle:  Needle type: Tuohy  Needle gauge: 17 G Needle length: 9 cm Needle insertion depth: 6 cm Catheter size: 19 Gauge Catheter at skin depth: 11 cm Test dose: negative and Other (1% lidocaine)  Assessment Events: blood not aspirated  Additional Notes Patient identified. Risks including, but not limited to, bleeding, infection, nerve damage, paralysis, inadequate analgesia, blood pressure changes, nausea, vomiting, allergic reaction, postpartum back pain, itching, and headache were discussed. Patient expressed understanding and wished to proceed. Sterile prep and drape, including hand hygiene, mask, and sterile gloves were used. The patient was positioned and the spine was prepped. The skin was anesthetized with lidocaine. No paraesthesia or other complication noted. The patient did not experience any signs of intravascular injection such as tinnitus or metallic taste in mouth, nor signs of intrathecal spread such as rapid motor block. Please see nursing notes for vital signs. The patient tolerated the procedure well.   Leslye Peer, MDReason for block:procedure for pain

## 2019-10-12 NOTE — Progress Notes (Signed)
FHT cat one UCs q2-3 min Epidural in Pitocin running Cx 4-5/80/-2/vtx AROM clear

## 2019-10-12 NOTE — Progress Notes (Signed)
Delivery Note At 3:05 PM a viable female was delivered via  (Presentation: Left Occiput Anterior).  APGAR: 9, 9; weight  .   Placenta status: Spontaneous, Intact.  Cord: 3 vessels with the following complications: None.  Cord pH:  Precipitous delivery in bed by nurse. I arrived in room <1 minute after delivery of baby. Anesthesia: Epidural Episiotomy: None Lacerations:  Small first degree ML lac, not bleeding, not repaired Suture Repair:  Est. Blood Loss (mL):    Mom to postpartum.  Baby to Couplet care / Skin to Skin.  Kristen Davidson II 10/12/2019, 3:16 PM

## 2019-10-12 NOTE — Anesthesia Preprocedure Evaluation (Signed)
Anesthesia Evaluation  Patient identified by MRN, date of birth, ID band Patient awake    Reviewed: Allergy & Precautions, NPO status , Patient's Chart, lab work & pertinent test results  History of Anesthesia Complications (+) PONV and history of anesthetic complications  Airway Mallampati: II   Neck ROM: Full    Dental   Pulmonary asthma ,    Pulmonary exam normal        Cardiovascular negative cardio ROS Normal cardiovascular exam     Neuro/Psych  Headaches, PSYCHIATRIC DISORDERS Anxiety    GI/Hepatic negative GI ROS, Neg liver ROS,   Endo/Other   Obesity   Renal/GU negative Renal ROS     Musculoskeletal  Chronic back pain    Abdominal   Peds  Hematology  (+) anemia ,  Plt 303k    Anesthesia Other Findings Covid test negative   Reproductive/Obstetrics (+) Pregnancy                             Anesthesia Physical Anesthesia Plan  ASA: II  Anesthesia Plan: Epidural   Post-op Pain Management:    Induction:   PONV Risk Score and Plan: 3 and Treatment may vary due to age or medical condition  Airway Management Planned: Natural Airway  Additional Equipment: None  Intra-op Plan:   Post-operative Plan:   Informed Consent: I have reviewed the patients History and Physical, chart, labs and discussed the procedure including the risks, benefits and alternatives for the proposed anesthesia with the patient or authorized representative who has indicated his/her understanding and acceptance.       Plan Discussed with: Anesthesiologist  Anesthesia Plan Comments: (Labs reviewed. Platelets acceptable, patient not taking any blood thinning medications. Per RN, FHR tracing reported to be stable enough for sitting procedure. Risks and benefits discussed with patient, including PDPH, backache, epidural hematoma, failed epidural, blood pressure changes, allergic reaction, and nerve  injury. Patient expressed understanding and wished to proceed.)        Anesthesia Quick Evaluation

## 2019-10-13 LAB — CBC
HCT: 29.7 % — ABNORMAL LOW (ref 36.0–46.0)
Hemoglobin: 9.6 g/dL — ABNORMAL LOW (ref 12.0–15.0)
MCH: 28.9 pg (ref 26.0–34.0)
MCHC: 32.3 g/dL (ref 30.0–36.0)
MCV: 89.5 fL (ref 80.0–100.0)
Platelets: 248 10*3/uL (ref 150–400)
RBC: 3.32 MIL/uL — ABNORMAL LOW (ref 3.87–5.11)
RDW: 14.3 % (ref 11.5–15.5)
WBC: 11.5 10*3/uL — ABNORMAL HIGH (ref 4.0–10.5)
nRBC: 0 % (ref 0.0–0.2)

## 2019-10-13 NOTE — Progress Notes (Signed)
PPD # 1  Doing well. No complaints except still feeling numb anterior portion of thigh.  BP 106/65 (BP Location: Left Arm)   Pulse 74   Temp 97.9 F (36.6 C) (Oral)   Resp 16   Ht 5\' 5"  (1.651 m)   Wt 97.4 kg   SpO2 100%   Breastfeeding Unknown   BMI 35.74 kg/m  Results for orders placed or performed during the hospital encounter of 10/12/19 (from the past 24 hour(s))  CBC     Status: Abnormal   Collection Time: 10/13/19  4:50 AM  Result Value Ref Range   WBC 11.5 (H) 4.0 - 10.5 K/uL   RBC 3.32 (L) 3.87 - 5.11 MIL/uL   Hemoglobin 9.6 (L) 12.0 - 15.0 g/dL   HCT 10/15/19 (L) 36 - 46 %   MCV 89.5 80.0 - 100.0 fL   MCH 28.9 26.0 - 34.0 pg   MCHC 32.3 30.0 - 36.0 g/dL   RDW 79.3 90.3 - 00.9 %   Platelets 248 150 - 400 K/uL   nRBC 0.0 0.0 - 0.2 %   Abdomen is soft and non tender Lochia WNL  PPD # 1  NSVD Anesthesia has been notified of numbness anterior thigh post epidural - they will evaluate/follow up today  Discharge home tomorrow

## 2019-10-13 NOTE — Progress Notes (Signed)
CSW received consult for hx of Anxiety.   CSW met with [name] to offer support and complete assessment.    CSW congratulated [name] and FOB on the birth of infant. CSW advised [name] of CSW's role and the reason for CSW coming to speak with her. [name] reported that she was unsure of when she was diagnosed with anxiety. [name] reports "I work at the [employer]".. CSW assumed that [name] was associating anxiety.stress with her job. [name] reported that she was on medication in her past, however currently not on anything but wishes to be started on something. CSW encouraged [name] to speak with OB provider or PCP to see what medication options are best for her. [name] reported that she understood and would. CSW offered [name] therapy resources in which [name] declined. CSW understanding was advised that [name] is not SI or HI and has no other mental health hx.   [name] reported that her supports include her spouse as well as her mom. [name] reported that she has all needed items to care for infant with infant sleeping in a basinet once arrived home and follow up care at [facility].   CSW provided education regarding the baby blues period vs. perinatal mood disorders, discussed treatment.  CSW recommends self-evaluation during the postpartum time period using the New Mom Checklist from Postpartum Progress and encouraged [name] to contact a medical professional if symptoms are noted at any time.   CSW provided review of Sudden Infant Death Syndrome (SIDS) precautions.   CSW identifies no further need for intervention and no barriers to discharge at this time.    Kristen Davidson Selma Mink, MSW, LCSW [facility] [place] [facility] [place] [phone]

## 2019-10-13 NOTE — Anesthesia Postprocedure Evaluation (Signed)
Anesthesia Post Note  Patient: Kristen Davidson  Procedure(s) Performed: AN AD HOC LABOR EPIDURAL     Patient location during evaluation: Mother Baby Anesthesia Type: Epidural Level of consciousness: awake Pain management: satisfactory to patient Vital Signs Assessment: post-procedure vital signs reviewed and stable Respiratory status: spontaneous breathing Cardiovascular status: stable Anesthetic complications: no   Pt complains of numbness in area over  L quadricept.  Numbness to cold. No motor deficits. Able to ambulate without weakness.  It seems to be slowly resolving.  Pt. Will call is she has further questions. Last Vitals:  Vitals:   10/13/19 0213 10/13/19 0522  BP: 96/62 106/65  Pulse: 67 74  Resp: 17 16  Temp: 36.6 C 36.6 C  SpO2:  100%    Last Pain:  Vitals:   10/13/19 0752  TempSrc:   PainSc: 0-No pain   Pain Goal: Patients Stated Pain Goal: 2 (10/13/19 0522)              Epidural/Spinal Function Cutaneous sensation: Normal sensation (10/13/19 0752), Patient able to flex knees: Yes (10/13/19 0752), Patient able to lift hips off bed: Yes (10/13/19 0752), Back pain beyond tenderness at insertion site: No (10/13/19 0752), Progressively worsening motor and/or sensory loss: No (10/13/19 0752), Bowel and/or bladder incontinence post epidural: No (10/13/19 0752)  Cephus Shelling

## 2019-10-13 NOTE — Lactation Note (Signed)
This note was copied from a baby's chart. Lactation Consultation Note  Patient Name: Kristen Davidson Date: 10/13/2019  Mom is a p3.  Mom reports that she tried to breastfeed with her first but engorgement was too bad so she quit.  Mom reports breastfed with her second baby for 3 months.  Really didn't have any challenges.  Mom has bruising around her areolas on both breasts.  Mom with hx of anxiety.  Rn assisted mom with latching in cross cradle hold.  Mom reports she feels [place] likes to do football hold better.  However mom reprots RN has been good at helping with her breastfeeding today. Assisted mom in taking [place] off the breast and repositioning her in the football hold. Showed mom how to do some hand expression in between and infant took approximately 8 ml via spoon.  Urged mom to start hand expressing and spoon feeding past breastfeedings.   Gave mom manual breast pump and showed her how to use it to make nipples evert more and stretch that tissue some,.  Infant latched and breastfed well in foot ball. Rythmic sucking and a few swallows noted.  Left mom and baby breastfeeding.  Urged to call lactation as needed. Discussed cluster feeding and baby's second night.Renae Gloss mom to feed on cue and 8-12 or more times day.  Call lactation as needed.   Maternal Data    Feeding Feeding Type: Breast Fed  [facility] Score                   Interventions    Lactation Tools Discussed/Used     Consult Status      Kristen Davidson Kristen Davidson 10/13/2019, 7:13 PM

## 2019-10-14 NOTE — Lactation Note (Signed)
This note was copied from a baby's chart. Lactation Consultation Note Mom called out. RN assisted in feeding. LC checked on mom, mom sleeping.  Patient Name: Girl Gaye Scorza VJKQA'S Date: 10/14/2019     Maternal Data    Feeding Feeding Type: Breast Fed  LATCH Score                   Interventions    Lactation Tools Discussed/Used     Consult Status      Chelesea Weiand, Diamond Nickel 10/14/2019, 12:07 AM

## 2019-10-14 NOTE — Discharge Summary (Signed)
Postpartum Discharge Summary  Date of Service updated 10/14/19     Patient Name: Kristen Davidson DOB: June 22, 1990 MRN: [id number]  Date of admission: 10/12/2019 Delivery date:10/12/2019  Delivering provider: Lanny Cramp  Date of discharge: 10/14/2019  Admitting diagnosis: Term pregnancy [Z34.90] Intrauterine pregnancy: [redacted]w[redacted]d    Secondary diagnosis:  Active Problems:   Term pregnancy  Additional problems: none    Discharge diagnosis: Term Pregnancy Delivered                                              Post partum procedures:none Augmentation: AROM and Pitocin Complications: None  Hospital course: Induction of Labor With Vaginal Delivery   29y.o. yo [id number] admitted to the hospital 10/12/2019 for induction of labor.  Indication for induction: Favorable cervix at term.  Patient had an uncomplicated labor course as follows: Membrane Rupture Time/Date: 1:20 PM ,10/12/2019   Delivery Method:Vaginal, Spontaneous  Episiotomy: None  Lacerations:  None  Details of delivery can be found in separate delivery note.  Patient had a routine postpartum course. Patient is discharged home 10/14/19.  Newborn Data: Birth date:10/12/2019  Birth time:3:05 PM  Gender:Female  Living status:Living  Apgars:9 ,9  Weight:3206 g   Magnesium Sulfate received: No BMZ received: No Rhophylac:N/A MMR:N/A T-DaP:Given prenatally Flu: N/A Transfusion:No  Physical exam  Vitals:   10/13/19 0522 10/13/19 1348 10/13/19 2025 10/14/19 0529  BP: 106/65 (!) 102/52 100/63 (!) 94/55  Pulse: 74 77 80 80  Resp: '16 18 18 18  ' Temp: 97.9 F (36.6 C) 97.7 F (36.5 C) (!) 97.5 F (36.4 C) 97.8 F (36.6 C)  TempSrc: Oral Oral Oral Oral  SpO2: 100% 100% 100%   Weight:      Height:       General: alert Lochia: appropriate Uterine Fundus: firm Incision: Healing well with no significant drainage DVT Evaluation: No evidence of DVT seen on physical exam. Labs: Lab Results  Component Value  Date   WBC 11.5 (H) 10/13/2019   HGB 9.6 (L) 10/13/2019   HCT 29.7 (L) 10/13/2019   MCV 89.5 10/13/2019   PLT 248 10/13/2019   CMP Latest Ref Rng & Units 07/22/2019  Glucose 70 - 99 mg/dL 96  BUN 6 - 20 mg/dL 11  Creatinine 0.44 - 1.00 mg/dL 0.55  Sodium 135 - 145 mmol/L 132(L)  Potassium 3.5 - 5.1 mmol/L 3.7  Chloride 98 - 111 mmol/L 100  CO2 22 - 32 mmol/L 21(L)  Calcium 8.9 - 10.3 mg/dL 8.0(L)  Total Protein 6.5 - 8.1 g/dL 7.0  Total Bilirubin 0.3 - 1.2 mg/dL 0.5  Alkaline Phos 38 - 126 U/L 159(H)  AST 15 - 41 U/L 19  ALT 0 - 44 U/L 17   Edinburgh Score: Edinburgh Postnatal Depression Scale Screening Tool 10/13/2019  I have been able to laugh and see the funny side of things. 0  I have looked forward with enjoyment to things. 0  I have blamed myself unnecessarily when things went wrong. 0  I have been anxious or worried for no good reason. 0  I have felt scared or panicky for no good reason. 0  Things have been getting on top of me. 0  I have been so unhappy that I have had difficulty sleeping. 0  I have felt sad or miserable. 0  I have  been so unhappy that I have been crying. 0  The thought of harming myself has occurred to me. 0  Edinburgh Postnatal Depression Scale Total 0      After visit meds:  Allergies as of 10/14/2019   No Known Allergies     Medication List    TAKE these medications   albuterol 108 (90 Base) MCG/ACT inhaler Commonly known as: VENTOLIN HFA Inhale 2 puffs into the lungs every 4 (four) hours as needed for wheezing or shortness of breath.   famotidine 20 MG tablet Commonly known as: PEPCID Take 1 tablet (20 mg total) by mouth daily.   prenatal multivitamin Tabs tablet Take 1 tablet by mouth daily at 12 noon.   Pulmicort Flexhaler 90 MCG/ACT inhaler Generic drug: Budesonide Inhale 2 puffs into the lungs 2 (two) times daily.        Discharge home in stable condition Infant Feeding: Breast Infant Disposition:home with  mother Discharge instruction: per After Visit Summary and Postpartum booklet. Activity: Advance as tolerated. Pelvic rest for 6 weeks.  Diet: routine diet Anticipated Birth Control: Unsure Postpartum Appointment:6 weeks Additional Postpartum F/U: none Future Appointments:No future appointments. Follow up Visit:      10/14/2019 Tyson Dense, MD

## 2019-11-11 ENCOUNTER — Ambulatory Visit: Payer: Self-pay

## 2019-11-11 NOTE — Lactation Note (Signed)
This note was copied from a baby's chart. Lactation Consultation Note  Patient Name: Kristen Davidson Date: 11/11/2019 Reason for consult: MD order Baby girl Kristen Davidson now 45 weeks old readmitted for RSV. Mom reports she was starting to improve some with breastfeeding and gaining and growing well.  Older child brought RSV home from daycare.  Mom reports she has been mostly pumping right now because it is hard for her to breastfeed. Mom concerned with milk supply.  Mom pumping about 30 mins to 1 hour past her eating and getting 2-3 oz.  Mom reports breasts and nipples feel good. Mom reports she has been on some breastfeeding facebook groups that she feels have helped her and scared her as well. Mom reports she has been buying lactation cookies.  Reports they are expensive and not sure if they are helping.  Discussed Dr. Yevette Edwards website on How to increase your Milk.   Urged mom to pump for 15-30/2 minutes past the last drop.  Mom reports she does not have a hands free bra. Discussed hand expression and massage.  Urged her to add that to pumping to get more milk.  Got mom Belly Band to make hands free bra while here so she could try it. Urged mom to use her hands to find sprays to try and get more that way. Praised breastfeeding efforts.  Discussed not trying to put her back to the breast until she is well that her sickness could affect her breastfeeding. Urged mom to call lactation as needed.    Maternal Data    Feeding Feeding Type: Bottle Fed - Breast Milk Nipple Type: Nfant Slow Flow (purple)  LATCH Score                   Interventions Interventions: Breast massage;Hand express;Breast compression;DEBP  Lactation Tools Discussed/Used Tools: Bottle Pump Review: Setup, frequency, and cleaning;Milk Storage   Consult Status Consult Status: PRN    Neomia Dear 11/11/2019, 8:45 PM

## 2020-02-11 NOTE — Progress Notes (Signed)
Office Visit Note  Patient: Kristen Davidson             Date of Birth: 11-21-1990           MRN: [id number]             PCP: London Pepper, MD Referring: London Pepper, MD Visit Date: 02/14/2020 Occupation: _0 @  Subjective:  Pain in joints.   History of Present Illness: Kristen Davidson is a 29 y.o. female patient was initially seen by me in 2012 after parvovirus infection, at the time she was having joint pain and stiffness.  All her autoimmune work-up was negative.  She had another visit in 2013 at that time also autoimmune work-up was negative.  According the patient she did quite well after 2013.  2 years ago in the postpartum time she started experiencing increased shortness of breath and joint pain.  She states she ignored the symptoms and related to the weight gain.  She also has upped the sizes of her rings.  She states her hands and feet have been hurting recently and her hands appear swollen.  She states there was times when she had difficulty walking and had to use crutches recently.  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  She states she also experiences some lower back pain had a stiffness in the morning.  None of the other joints are painful.  She is gravida 4, para 3, miscarriages 1.  She is on oral contraceptive pills now.  She is 4 months postpartum.  She is currently nursing.  There is family history of autoimmune disease in her mother.  There is no history of oral ulcers, nasal ulcers, malar rash, photosensitivity, Raynaud's phenomenon or lymphadenopathy.  Activities of Daily Living:  Patient reports morning stiffness for several  hours.   Patient Denies nocturnal pain.  Difficulty dressing/grooming: Denies Difficulty climbing stairs: Denies Difficulty getting out of chair: Denies Difficulty using hands for taps, buttons, cutlery, and/or writing: Reports  Review of Systems  Constitutional: Positive for fatigue.  HENT:  Negative for mouth sores, mouth dryness and nose dryness.   Eyes: Negative for pain, itching and dryness.  Respiratory: Positive for shortness of breath and wheezing. Negative for cough.        H/o asthma  Cardiovascular: Negative for chest pain and palpitations.  Gastrointestinal: Negative for blood in stool, constipation and diarrhea.  Endocrine: Negative for increased urination.  Genitourinary: Negative for difficulty urinating.  Musculoskeletal: Positive for arthralgias, joint pain, joint swelling, myalgias, morning stiffness, muscle tenderness and myalgias.  Skin: Negative for color change, rash, redness and sensitivity to sunlight.  Allergic/Immunologic: Negative for susceptible to infections.  Neurological: Positive for dizziness. Negative for numbness, headaches, memory loss and weakness.  Hematological: Negative for bruising/bleeding tendency and swollen glands.  Psychiatric/Behavioral: Negative for depressed mood and confusion. The patient is nervous/anxious.     PMFS History:  Patient Active Problem List   Diagnosis Date Noted  . History of asthma 02/14/2020  . Family history of Sjogren's disease 02/14/2020  . History of anxiety 02/14/2020  . Term pregnancy 10/12/2019  . Pregnancy 09/22/2017  . H. pylori infection 08/18/2012  . Chronic back pain 08/18/2012    Past Medical History:  Diagnosis Date  . Abnormal Pap smear   . Allergy   . Anxiety   . Asthma   . Headache(784.0)   . PONV (postoperative nausea and vomiting)     Family  History  Problem Relation Age of Onset  . Heart disease Maternal Grandfather   . Diabetes Maternal Grandfather   . Anxiety disorder Maternal Grandfather   . Hypertension Mother   . Heart disease Mother        PVCs  . Anemia Mother   . Thyroid disease Mother   . Migraines Mother   . Lupus Mother   . Rheum arthritis Mother   . Fibromyalgia Mother   . Hypertension Father   . Heart disease Father   . Diabetes Maternal Aunt   . Kidney  disease Maternal Aunt   . Thyroid disease Maternal Grandmother   . Cancer Paternal Grandmother        lymphoma  . Cancer Paternal Grandfather        prostate  . Healthy Brother   . Asthma Daughter   . Healthy Daughter   . Healthy Daughter   . Healthy Daughter   . Anesthesia problems Neg Hx   . Hypotension Neg Hx   . Malignant hyperthermia Neg Hx   . Pseudochol deficiency Neg Hx    Past Surgical History:  Procedure Laterality Date  . CHOLECYSTECTOMY N/A 10/09/2012   Procedure: LAPAROSCOPIC CHOLECYSTECTOMY WITH INTRAOPERATIVE CHOLANGIOGRAM;  Surgeon: Shann Medal, MD;  Location: [facility];  Service: General;  Laterality: N/A;  . COLPOSCOPY    . TONSILLECTOMY    . WISDOM TOOTH EXTRACTION     Social History   Social History Narrative  . Not on file   Immunization History  Administered Date(s) Administered  . Influenza Split 01/25/2012  . Influenza-Unspecified 05/02/2016     Objective: Vital Signs: BP 103/70 (BP Location: Right Arm, Patient Position: Sitting, Cuff Size: Normal)   Pulse 71   Resp 14   Ht _0  (1.676 m)   Wt 203 lb 12.8 oz (92.4 kg)   BMI 32.89 kg/m    Physical Exam Vitals and nursing note reviewed.  Constitutional:      Appearance: She is well-developed.  HENT:     Head: Normocephalic and atraumatic.  Eyes:     Conjunctiva/sclera: Conjunctivae normal.  Cardiovascular:     Rate and Rhythm: Normal rate and regular rhythm.     Heart sounds: Normal heart sounds.  Pulmonary:     Effort: Pulmonary effort is normal.     Breath sounds: Normal breath sounds.  Abdominal:     General: Bowel sounds are normal.     Palpations: Abdomen is soft.  Musculoskeletal:     Cervical back: Normal range of motion.  Lymphadenopathy:     Cervical: No cervical adenopathy.  Skin:    General: Skin is warm and dry.     Capillary Refill: Capillary refill takes less than 2 seconds.  Neurological:     Mental Status: She is alert and oriented to person, place, and time.    Psychiatric:        Behavior: Behavior normal.      Musculoskeletal Exam: C-spine thoracic and lumbar spine with good range of motion.  She has some tenderness over bilateral SI joints and lower lumbar region.  Shoulder joints, elbow joints, wrist joints, MCPs PIPs and DIPs with good range of motion with no synovitis.  Hip joints, knee joints, ankles, MTPs and PIPs with good range of motion with no synovitis.  She had discomfort over the base of her left fifth metatarsal tarsal joint.  There was no evidence of Achilles tendinitis or plantar fasciitis.  CDAI Exam: CDAI Score: -- Patient Global: --;  Provider Global: -- Swollen: --; Tender: -- Joint Exam 02/14/2020   No joint exam has been documented for this visit   There is currently no information documented on the homunculus. Go to the Rheumatology activity and complete the homunculus joint exam.  Investigation: No additional findings.  Imaging: XR Foot 2 Views Left  Result Date: 02/14/2020 No MCP, PIP or DIP narrowing was noted.  No intertarsal, tibiotalar or subtalar joint space narrowing was noted.  No erosive changes were noted. Impression: Unremarkable x-ray of the foot.  XR Foot 2 Views Right  Result Date: 02/14/2020 No MCP, PIP or DIP narrowing was noted.  No intertarsal, tibiotalar or subtalar joint space narrowing was noted.  No erosive changes were noted. Impression: Unremarkable x-ray of the foot.  XR Hand 2 View Left  Result Date: 02/14/2020 No MCP, PIP or DIP narrowing was noted.  No intercarpal or radiocarpal joint space narrowing was noted.  No erosive changes were noted. Impression: Unremarkable x-ray of the hand.  XR Hand 2 View Right  Result Date: 02/14/2020 No MCP, PIP or DIP narrowing was noted.  No intercarpal or radiocarpal joint space narrowing was noted.  No erosive changes were noted. Impression: Unremarkable x-ray of the hand.  XR Pelvis 1-2 Views  Result Date: 02/14/2020 No SI joint narrowing  or sclerosis was noted. Impression: Unremarkable x-ray of the SI joints.   Recent Labs: Lab Results  Component Value Date   WBC 11.5 (H) 10/13/2019   HGB 9.6 (L) 10/13/2019   PLT 248 10/13/2019   NA 132 (L) 07/22/2019   K 3.7 07/22/2019   CL 100 07/22/2019   CO2 21 (L) 07/22/2019   GLUCOSE 96 07/22/2019   BUN 11 07/22/2019   CREATININE 0.55 07/22/2019   BILITOT 0.5 07/22/2019   ALKPHOS 159 (H) 07/22/2019   AST 19 07/22/2019   ALT 17 07/22/2019   PROT 7.0 07/22/2019   ALBUMIN 2.7 (L) 07/22/2019   CALCIUM 8.0 (L) 07/22/2019   GFRAA >60 07/22/2019   October 07, 2011 sed rate 27 ANA negative, C3-C4 negative, anticardiolipin negative, ENA negative, anti-CCP negative, RF negative, parvo titer negative  April 17, 2010 lupus anticoagulant negative, beta-2 GP 1 -, anticardiolipin borderline positive, C4 low, ACE 63, hepatitis A, B and C-,  February 10, 2020 labs from her PCP CBC normal, CMP normal except alkaline phosphatase of 137, TSH normal  Speciality Comments: No specialty comments available.  Procedures:  No procedures performed Allergies: Patient has no known allergies.   Assessment / Plan:     Visit Diagnoses: Inflammatory polyarthritis ([place]) -patient was seen several years ago after viral infection and inflammatory arthritis.  At the time she had +synovitis, +morning stiffness, elevated ESR, RF-ANA, dsDNA, and anticardiolipin ab+ after parvo infection and then later all serology became negative.  She has done well for many years and now for the last 2 years she has been having increased pain.  She is 4 months postpartum now and experiencing increased pain.  Pain in both hands -she complains of pain and discomfort in her bilateral hands.  She has also had to increase her ring sizes.  She relates it to weight gain.  Plan: XR Hand 2 View Right, XR Hand 2 View Left, x-rays were unremarkable.  Rheumatoid factor, Cyclic citrul peptide antibody, IgG, ANA  Pain in both feet -she  complains of severe pain in her bilateral feet to the point she has to use crutches at bedtime.  She had difficulty walking today due to  pain in her feet especially over the left fifth metatarsal tarsal joint.  There was no point tenderness on palpation.  No synovitis was noted.  Plan: XR Foot 2 Views Right, XR Foot 2 Views Left, x-rays were unremarkable.  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  She has some tenderness on palpation.  There is no history of psoriasis, Achilles tendinitis or plantar fasciitis.  Plan: XR Pelvis 1-2 Views, no SI joint sclerosis or narrowing was noted.  HLA-B27 antigen  Other fatigue -she has been experiencing increased fatigue for the last few years.  Her alkaline phosphatase is mildly elevated which could be due to recent pregnancy.  Plan: Glucose 6 phosphate dehydrogenase, Serum protein electrophoresis with reflex, CK  Family history of fibromyalgia-mother  History of anxiety-she is currently on no medications.  History of asthma - Since childhood.  She uses albuterol inhaler.  History of Helicobacter pylori infection  Family history of Sjogren's disease - Her mother has Sjogren's and ILD(LIP)  Orders: Orders Placed This Encounter  Procedures  . XR Hand 2 View Right  . XR Hand 2 View Left  . XR Foot 2 Views Right  . XR Foot 2 Views Left  . XR Pelvis 1-2 Views  . Rheumatoid factor  . Cyclic citrul peptide antibody, IgG  . ANA  . HLA-B27 antigen  . Sedimentation rate  . Glucose 6 phosphate dehydrogenase  . Serum protein electrophoresis with reflex  . CK   No orders of the defined types were placed in this encounter.    Follow-Up Instructions: Return for Pain in multiple joints.   Bo Merino, MD  Note - This record has been created using Editor, commissioning.  Chart creation errors have been sought, but may not always  have been located. Such creation errors do not reflect on  the standard of  medical care.

## 2020-02-14 ENCOUNTER — Ambulatory Visit: Payer: Self-pay

## 2020-02-14 ENCOUNTER — Other Ambulatory Visit: Payer: Self-pay

## 2020-02-14 ENCOUNTER — Ambulatory Visit (INDEPENDENT_AMBULATORY_CARE_PROVIDER_SITE_OTHER): Payer: BC Managed Care – PPO | Admitting: Rheumatology

## 2020-02-14 ENCOUNTER — Encounter: Payer: Self-pay | Admitting: Rheumatology

## 2020-02-14 VITALS — BP 103/70 | HR 71 | Resp 14 | Ht 66.0 in | Wt 203.8 lb

## 2020-02-14 DIAGNOSIS — M79642 Pain in left hand: Secondary | ICD-10-CM

## 2020-02-14 DIAGNOSIS — M79641 Pain in right hand: Secondary | ICD-10-CM

## 2020-02-14 DIAGNOSIS — M79671 Pain in right foot: Secondary | ICD-10-CM

## 2020-02-14 DIAGNOSIS — M064 Inflammatory polyarthropathy: Secondary | ICD-10-CM | POA: Diagnosis not present

## 2020-02-14 DIAGNOSIS — Z8619 Personal history of other infectious and parasitic diseases: Secondary | ICD-10-CM

## 2020-02-14 DIAGNOSIS — Z8269 Family history of other diseases of the musculoskeletal system and connective tissue: Secondary | ICD-10-CM

## 2020-02-14 DIAGNOSIS — M79672 Pain in left foot: Secondary | ICD-10-CM | POA: Diagnosis not present

## 2020-02-14 DIAGNOSIS — Z8659 Personal history of other mental and behavioral disorders: Secondary | ICD-10-CM

## 2020-02-14 DIAGNOSIS — Z8709 Personal history of other diseases of the respiratory system: Secondary | ICD-10-CM

## 2020-02-14 DIAGNOSIS — G8929 Other chronic pain: Secondary | ICD-10-CM

## 2020-02-14 DIAGNOSIS — R5383 Other fatigue: Secondary | ICD-10-CM

## 2020-02-14 DIAGNOSIS — M533 Sacrococcygeal disorders, not elsewhere classified: Secondary | ICD-10-CM

## 2020-02-16 LAB — PROTEIN ELECTROPHORESIS, SERUM, WITH REFLEX
Albumin ELP: 4.2 g/dL (ref 3.8–4.8)
Alpha 1: 0.3 g/dL (ref 0.2–0.3)
Alpha 2: 1 g/dL — ABNORMAL HIGH (ref 0.5–0.9)
Beta 2: 0.5 g/dL (ref 0.2–0.5)
Beta Globulin: 0.4 g/dL (ref 0.4–0.6)
Gamma Globulin: 1.7 g/dL (ref 0.8–1.7)
Total Protein: 8.1 g/dL (ref 6.1–8.1)

## 2020-02-16 LAB — SEDIMENTATION RATE: Sed Rate: 46 mm/h — ABNORMAL HIGH (ref 0–20)

## 2020-02-16 LAB — RHEUMATOID FACTOR: Rheumatoid fact SerPl-aCnc: <14 IU/mL (ref <14)

## 2020-02-16 LAB — HLA-B27 ANTIGEN: HLA-B27 Antigen: NEGATIVE

## 2020-02-16 LAB — ANA: Anti Nuclear Antibody (ANA): NEGATIVE

## 2020-02-16 LAB — GLUCOSE 6 PHOSPHATE DEHYDROGENASE: G-6PDH: 20.6 U/g Hgb — ABNORMAL HIGH (ref 7.0–20.5)

## 2020-02-16 LAB — CK: Total CK: 74 U/L (ref 29–143)

## 2020-02-16 LAB — CYCLIC CITRUL PEPTIDE ANTIBODY, IGG: Cyclic Citrullin Peptide Ab: <16 UNITS

## 2020-02-17 NOTE — Progress Notes (Signed)
Sed rate is elevated which indicates inflammation.  Rest the labs are normal.  Please a schedule ultrasound bilateral hands to look for synovitis.

## 2020-02-25 NOTE — Progress Notes (Signed)
Office Visit Note  Patient: Kristen Davidson             Date of Birth: 06-06-1990           MRN: [id number]             PCP: London Pepper, MD Referring: London Pepper, MD Visit Date: 03/09/2020 Occupation: '@GUAROCC' @  Subjective:  Pain in both hands and feet.   History of Present Illness: Kristen Davidson is a 29 y.o. female with history of joint pain and fatigue.  She states she continues to have discomfort in her bilateral hands and bilateral feet.  She has difficulty opening jars and bottles.  She states her left hand has been hurting recently.  She continues to have fatigue.  She denies any insomnia.  She has a 33-monthold baby boy wakes up only once at night.  She has been having off-and-on muscle pain as well.  Activities of Daily Living:  Patient reports morning stiffness for 1 hour.   Patient Reports nocturnal pain.  Difficulty dressing/grooming: Denies Difficulty climbing stairs: Denies Difficulty getting out of chair: Denies Difficulty using hands for taps, buttons, cutlery, and/or writing: Reports  Review of Systems  Constitutional: Positive for fatigue.  HENT: Negative for mouth sores, mouth dryness and nose dryness.   Eyes: Negative for pain, itching, visual disturbance and dryness.  Respiratory: Positive for cough, shortness of breath, wheezing and difficulty breathing. Negative for hemoptysis.   Cardiovascular: Negative for chest pain, palpitations and swelling in legs/feet.  Gastrointestinal: Negative for abdominal pain, blood in stool, constipation and diarrhea.  Endocrine: Negative for increased urination.  Genitourinary: Negative for painful urination.  Musculoskeletal: Positive for joint swelling.  Skin: Positive for rash. Negative for color change and redness.  Allergic/Immunologic: Negative for susceptible to infections.  Neurological: Positive for dizziness. Negative for numbness, headaches, memory loss and weakness.  Hematological: Negative for swollen  glands.  Psychiatric/Behavioral: Negative for confusion and sleep disturbance.    PMFS History:  Patient Active Problem List   Diagnosis Date Noted  . History of asthma 02/14/2020  . Family history of Sjogren's disease 02/14/2020  . History of anxiety 02/14/2020  . Term pregnancy 10/12/2019  . Pregnancy 09/22/2017  . H. pylori infection 08/18/2012  . Chronic back pain 08/18/2012    Past Medical History:  Diagnosis Date  . Abnormal Pap smear   . Allergy   . Anxiety   . Asthma   . Headache(784.0)   . PONV (postoperative nausea and vomiting)     Family History  Problem Relation Age of Onset  . Heart disease Maternal Grandfather   . Diabetes Maternal Grandfather   . Anxiety disorder Maternal Grandfather   . Hypertension Mother   . Heart disease Mother        PVCs  . Anemia Mother   . Thyroid disease Mother   . Migraines Mother   . Lupus Mother   . Rheum arthritis Mother   . Fibromyalgia Mother   . Hypertension Father   . Heart disease Father   . Diabetes Maternal Aunt   . Kidney disease Maternal Aunt   . Thyroid disease Maternal Grandmother   . Cancer Paternal Grandmother        lymphoma  . Cancer Paternal Grandfather        prostate  . Healthy Brother   . Asthma Daughter   . Healthy Daughter   . Healthy Daughter   . Healthy Daughter   . Anesthesia problems Neg Hx   .  Hypotension Neg Hx   . Malignant hyperthermia Neg Hx   . Pseudochol deficiency Neg Hx    Past Surgical History:  Procedure Laterality Date  . CHOLECYSTECTOMY N/A 10/09/2012   Procedure: LAPAROSCOPIC CHOLECYSTECTOMY WITH INTRAOPERATIVE CHOLANGIOGRAM;  Surgeon: Shann Medal, MD;  Location: [facility];  Service: General;  Laterality: N/A;  . COLPOSCOPY    . TONSILLECTOMY    . WISDOM TOOTH EXTRACTION     Social History   Social History Narrative  . Not on file   Immunization History  Administered Date(s) Administered  . Influenza Split 01/25/2012  . Influenza-Unspecified 05/02/2016      Objective: Vital Signs: BP 109/72 (BP Location: Left Arm, Patient Position: Sitting, Cuff Size: Small)   Pulse 85   Ht '5\' 7"'  (1.702 m)   Wt 205 lb (93 kg)   BMI 32.11 kg/m    Physical Exam Vitals and nursing note reviewed.  Constitutional:      Appearance: She is well-developed and well-nourished.  HENT:     Head: Normocephalic and atraumatic.  Eyes:     Extraocular Movements: EOM normal.     Conjunctiva/sclera: Conjunctivae normal.  Cardiovascular:     Rate and Rhythm: Normal rate and regular rhythm.     Pulses: Intact distal pulses.     Heart sounds: Normal heart sounds.  Pulmonary:     Effort: Pulmonary effort is normal.     Breath sounds: Normal breath sounds.  Abdominal:     General: Bowel sounds are normal.     Palpations: Abdomen is soft.  Musculoskeletal:     Cervical back: Normal range of motion.  Lymphadenopathy:     Cervical: No cervical adenopathy.  Skin:    General: Skin is warm and dry.     Capillary Refill: Capillary refill takes less than 2 seconds.  Neurological:     Mental Status: She is alert and oriented to person, place, and time.  Psychiatric:        Mood and Affect: Mood and affect normal.        Behavior: Behavior normal.      Musculoskeletal Exam: C-spine thoracic and lumbar spine with good range of motion.  She continues to have some gluteal pain.  Shoulder joints, elbow joints, wrist joints, MCPs , PIPs and DIPs with good range of motion with no synovitis.  Hip joints, knee joints, ankles, MTPs and PIPs with good range of motion with no synovitis.  CDAI Exam: CDAI Score: -- Patient Global: --; Provider Global: -- Swollen: --; Tender: -- Joint Exam 03/09/2020   No joint exam has been documented for this visit   There is currently no information documented on the homunculus. Go to the Rheumatology activity and complete the homunculus joint exam.  Investigation: No additional findings.  Imaging: [place] COMPLETE JOINT SPACE STRUCTURES  UP BILAT  Result Date: 03/09/2020 Ultrasound examination of bilateral hands was performed per EULAR recommendations. Using 15 MHz transducer, grayscale and power Doppler bilateral second, third, and fifth MCP joints and bilateral wrist joints both dorsal and volar aspects were evaluated to look for synovitis or tenosynovitis. The findings were there was no synovitis or tenosynovitis on ultrasound examination. Right median nerve was 0.06 cm squares which was within normal limits and left median nerve was 0.07 cm squares which was within normal limits. Impression: Ultrasound examination did not show any synovitis or tenosynovitis.  Bilateral median nerves within normal limits.  XR Foot 2 Views Left  Result Date: 02/14/2020 No MCP, PIP or  DIP narrowing was noted.  No intertarsal, tibiotalar or subtalar joint space narrowing was noted.  No erosive changes were noted. Impression: Unremarkable x-ray of the foot.  XR Foot 2 Views Right  Result Date: 02/14/2020 No MCP, PIP or DIP narrowing was noted.  No intertarsal, tibiotalar or subtalar joint space narrowing was noted.  No erosive changes were noted. Impression: Unremarkable x-ray of the foot.  XR Hand 2 View Left  Result Date: 02/14/2020 No MCP, PIP or DIP narrowing was noted.  No intercarpal or radiocarpal joint space narrowing was noted.  No erosive changes were noted. Impression: Unremarkable x-ray of the hand.  XR Hand 2 View Right  Result Date: 02/14/2020 No MCP, PIP or DIP narrowing was noted.  No intercarpal or radiocarpal joint space narrowing was noted.  No erosive changes were noted. Impression: Unremarkable x-ray of the hand.  XR Pelvis 1-2 Views  Result Date: 02/14/2020 No SI joint narrowing or sclerosis was noted. Impression: Unremarkable x-ray of the SI joints.   Recent Labs: Lab Results  Component Value Date   WBC 11.5 (H) 10/13/2019   HGB 9.6 (L) 10/13/2019   PLT 248 10/13/2019   NA 132 (L) 07/22/2019   K 3.7  07/22/2019   CL 100 07/22/2019   CO2 21 (L) 07/22/2019   GLUCOSE 96 07/22/2019   BUN 11 07/22/2019   CREATININE 0.55 07/22/2019   BILITOT 0.5 07/22/2019   ALKPHOS 159 (H) 07/22/2019   AST 19 07/22/2019   ALT 17 07/22/2019   PROT 8.1 02/14/2020   ALBUMIN 2.7 (L) 07/22/2019   CALCIUM 8.0 (L) 07/22/2019   GFRAA >60 07/22/2019   February 14, 2020 SPEP showed an isolated elevation of alpha-2 globulins consistent with acute inflammation, ESR 46, RF negative, anti-CCP negative, ANA negative, HLA-B27 negative, CK 74, ESR 46, G6PD 20.6 Speciality Comments: No specialty comments available.  Procedures:  No procedures performed Allergies: Patient has no known allergies.   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 Now increased pain x 2 yrs.  I do not see any synovitis on examination.  Her sedimentation rate is elevated.  Pain in both hands - ESR 46, autoimmune work-up related.  X-rays were unremarkable. - Plan: [place] COMPLETE JOINT SPACE STRUCTURES UP BILAT.  Ultrasound explained today did not show any synovitis or tenosynovitis.  Bilateral median nerves within normal limits.  Pain in both feet - h/o intermittent severe pain in her feet to the point that she has been using crutches.  X-rays were unremarkable.  I do not see any synovitis on examination today.  Chronic SI joint pain - HLA-B27 negative, x-rays were unremarkable.  Myalgia-she complains of muscle pain.  CK was normal.  She may have myofascial pain syndrome.  There is positive family history of fibromyalgia.  Need for regular exercise and good sleep hygiene was discussed.  Stretching exercises were emphasized.  Other fatigue -she continues to have fatigue.  Her labs from October 13, 2019 showed hemoglobin of 9.6.  She may have elevated sedimentation rate due to anemia.  I will repeat CBC and CMP  with GFR.  Plan: CBC with Differential/Platelet, COMPLETE METABOLIC PANEL WITH GFR.  If she has persistent anemia she may need further evaluation.  Family history of Sjogren's disease - History of Sjogren's and ILD in her mother.  Family history of fibromyalgia - Mother  History of anxiety  History  of asthma  History of Helicobacter pylori infection  Orders: Orders Placed This Encounter  Procedures  . [place] COMPLETE JOINT SPACE STRUCTURES UP BILAT  . CBC with Differential/Platelet  . COMPLETE METABOLIC PANEL WITH GFR   No orders of the defined types were placed in this encounter.     Follow-Up Instructions: Return in about 3 months (around 06/07/2020).   Bo Merino, MD  Note - This record has been created using Editor, commissioning.  Chart creation errors have been sought, but may not always  have been located. Such creation errors do not reflect on  the standard of medical care.

## 2020-03-09 ENCOUNTER — Other Ambulatory Visit: Payer: Self-pay

## 2020-03-09 ENCOUNTER — Ambulatory Visit: Payer: BC Managed Care – PPO

## 2020-03-09 ENCOUNTER — Ambulatory Visit (INDEPENDENT_AMBULATORY_CARE_PROVIDER_SITE_OTHER): Payer: BC Managed Care – PPO | Admitting: Rheumatology

## 2020-03-09 ENCOUNTER — Encounter: Payer: Self-pay | Admitting: Rheumatology

## 2020-03-09 VITALS — BP 109/72 | HR 85 | Ht 67.0 in | Wt 205.0 lb

## 2020-03-09 DIAGNOSIS — M79671 Pain in right foot: Secondary | ICD-10-CM | POA: Diagnosis not present

## 2020-03-09 DIAGNOSIS — G8929 Other chronic pain: Secondary | ICD-10-CM

## 2020-03-09 DIAGNOSIS — R5383 Other fatigue: Secondary | ICD-10-CM

## 2020-03-09 DIAGNOSIS — M79672 Pain in left foot: Secondary | ICD-10-CM

## 2020-03-09 DIAGNOSIS — Z8659 Personal history of other mental and behavioral disorders: Secondary | ICD-10-CM

## 2020-03-09 DIAGNOSIS — Z8269 Family history of other diseases of the musculoskeletal system and connective tissue: Secondary | ICD-10-CM

## 2020-03-09 DIAGNOSIS — M2550 Pain in unspecified joint: Secondary | ICD-10-CM | POA: Diagnosis not present

## 2020-03-09 DIAGNOSIS — M064 Inflammatory polyarthropathy: Secondary | ICD-10-CM

## 2020-03-09 DIAGNOSIS — M791 Myalgia, unspecified site: Secondary | ICD-10-CM

## 2020-03-09 DIAGNOSIS — M533 Sacrococcygeal disorders, not elsewhere classified: Secondary | ICD-10-CM

## 2020-03-09 DIAGNOSIS — Z8619 Personal history of other infectious and parasitic diseases: Secondary | ICD-10-CM

## 2020-03-09 DIAGNOSIS — M79642 Pain in left hand: Secondary | ICD-10-CM

## 2020-03-09 DIAGNOSIS — M79641 Pain in right hand: Secondary | ICD-10-CM | POA: Diagnosis not present

## 2020-03-09 DIAGNOSIS — Z8709 Personal history of other diseases of the respiratory system: Secondary | ICD-10-CM

## 2020-03-10 LAB — CBC WITH DIFFERENTIAL/PLATELET
Absolute Monocytes: 869 {cells}/uL (ref 200–950)
Basophils Absolute: 29 {cells}/uL (ref 0–200)
Basophils Relative: 0.4 %
Eosinophils Absolute: 518 {cells}/uL — ABNORMAL HIGH (ref 15–500)
Eosinophils Relative: 7.1 %
HCT: 35.1 % (ref 35.0–45.0)
Hemoglobin: 12.1 g/dL (ref 11.7–15.5)
Lymphs Abs: 2161 {cells}/uL (ref 850–3900)
MCH: 29.4 pg (ref 27.0–33.0)
MCHC: 34.5 g/dL (ref 32.0–36.0)
MCV: 85.2 fL (ref 80.0–100.0)
MPV: 10.5 fL (ref 7.5–12.5)
Monocytes Relative: 11.9 %
Neutro Abs: 3723 {cells}/uL (ref 1500–7800)
Neutrophils Relative %: 51 %
Platelets: 301 Thousand/uL (ref 140–400)
RBC: 4.12 Million/uL (ref 3.80–5.10)
RDW: 12.4 % (ref 11.0–15.0)
Total Lymphocyte: 29.6 %
WBC: 7.3 Thousand/uL (ref 3.8–10.8)

## 2020-03-10 LAB — COMPLETE METABOLIC PANEL WITH GFR
AG Ratio: 1.3 (calc) (ref 1.0–2.5)
ALT: 20 U/L (ref 6–29)
AST: 20 U/L (ref 10–30)
Albumin: 4.5 g/dL (ref 3.6–5.1)
Alkaline phosphatase (APISO): 129 U/L — ABNORMAL HIGH (ref 31–125)
BUN: 9 mg/dL (ref 7–25)
CO2: 27 mmol/L (ref 20–32)
Calcium: 8.9 mg/dL (ref 8.6–10.2)
Chloride: 104 mmol/L (ref 98–110)
Creat: 0.56 mg/dL (ref 0.50–1.10)
GFR, Est African American: 144 mL/min/{1.73_m2} (ref >=60)
GFR, Est Non African American: 124 mL/min/{1.73_m2} (ref >=60)
Globulin: 3.6 g/dL (calc) (ref 1.9–3.7)
Glucose, Bld: 88 mg/dL (ref 65–99)
Potassium: 3.9 mmol/L (ref 3.5–5.3)
Sodium: 138 mmol/L (ref 135–146)
Total Bilirubin: 0.3 mg/dL (ref 0.2–1.2)
Total Protein: 8.1 g/dL (ref 6.1–8.1)

## 2020-03-10 NOTE — Progress Notes (Signed)
CBC is normal anemia improved.  Eosinophils are mildly elevated which could be due to allergies.  Alkaline phosphatase is mildly elevated with which is not a significant increase.

## 2020-05-11 ENCOUNTER — Ambulatory Visit: Payer: BC Managed Care – PPO | Admitting: Rheumatology

## 2020-05-31 NOTE — Progress Notes (Deleted)
Office Visit Note  Patient: Kristen Davidson             Date of Birth: November 06, 1990           MRN: [id number]             PCP: Farris Has, MD Referring: Farris Has, MD Visit Date: 06/14/2020 Occupation: @GUAROCC @  Subjective:  No chief complaint on file.   History of Present Illness: Kristen Davidson is a 30 y.o. female ***   Activities of Daily Living:  Patient reports morning stiffness for *** {minute/hour:19697}.   Patient {ACTIONS;DENIES/REPORTS:21021675::"Denies"} nocturnal pain.  Difficulty dressing/grooming: {ACTIONS;DENIES/REPORTS:21021675::"Denies"} Difficulty climbing stairs: {ACTIONS;DENIES/REPORTS:21021675::"Denies"} Difficulty getting out of chair: {ACTIONS;DENIES/REPORTS:21021675::"Denies"} Difficulty using hands for taps, buttons, cutlery, and/or writing: {ACTIONS;DENIES/REPORTS:21021675::"Denies"}  No Rheumatology ROS completed.   PMFS History:  Patient Active Problem List   Diagnosis Date Noted  . History of asthma 02/14/2020  . Family history of Sjogren's disease 02/14/2020  . History of anxiety 02/14/2020  . Term pregnancy 10/12/2019  . Pregnancy 09/22/2017  . H. pylori infection 08/18/2012  . Chronic back pain 08/18/2012    Past Medical History:  Diagnosis Date  . Abnormal Pap smear   . Allergy   . Anxiety   . Asthma   . Headache(784.0)   . PONV (postoperative nausea and vomiting)     Family History  Problem Relation Age of Onset  . Heart disease Maternal Grandfather   . Diabetes Maternal Grandfather   . Anxiety disorder Maternal Grandfather   . Hypertension Mother   . Heart disease Mother        PVCs  . Anemia Mother   . Thyroid disease Mother   . Migraines Mother   . Lupus Mother   . Rheum arthritis Mother   . Fibromyalgia Mother   . Hypertension Father   . Heart disease Father   . Diabetes Maternal Aunt   . Kidney disease Maternal Aunt   . Thyroid disease Maternal Grandmother   . Cancer Paternal Grandmother        lymphoma   . Cancer Paternal Grandfather        prostate  . Healthy Brother   . Asthma Daughter   . Healthy Daughter   . Healthy Daughter   . Healthy Daughter   . Anesthesia problems Neg Hx   . Hypotension Neg Hx   . Malignant hyperthermia Neg Hx   . Pseudochol deficiency Neg Hx    Past Surgical History:  Procedure Laterality Date  . CHOLECYSTECTOMY N/A 10/09/2012   Procedure: LAPAROSCOPIC CHOLECYSTECTOMY WITH INTRAOPERATIVE CHOLANGIOGRAM;  Surgeon: 12/10/2012, MD;  Location: [facility] OR;  Service: General;  Laterality: N/A;  . COLPOSCOPY    . TONSILLECTOMY    . WISDOM TOOTH EXTRACTION     Social History   Social History Narrative  . Not on file   Immunization History  Administered Date(s) Administered  . Influenza Split 01/25/2012  . Influenza-Unspecified 05/02/2016     Objective: Vital Signs: There were no vitals taken for this visit.   Physical Exam   Musculoskeletal Exam: ***  CDAI Exam: CDAI Score: -- Patient Global: --; Provider Global: -- Swollen: --; Tender: -- Joint Exam 06/14/2020   No joint exam has been documented for this visit   There is currently no information documented on the homunculus. Go to the Rheumatology activity and complete the homunculus joint exam.  Investigation: No additional findings.  Imaging: No results found.  Recent Labs: Lab Results  Component Value Date  WBC 7.3 03/09/2020   HGB 12.1 03/09/2020   PLT 301 03/09/2020   NA 138 03/09/2020   K 3.9 03/09/2020   CL 104 03/09/2020   CO2 27 03/09/2020   GLUCOSE 88 03/09/2020   BUN 9 03/09/2020   CREATININE 0.56 03/09/2020   BILITOT 0.3 03/09/2020   ALKPHOS 159 (H) 07/22/2019   AST 20 03/09/2020   ALT 20 03/09/2020   PROT 8.1 03/09/2020   ALBUMIN 2.7 (L) 07/22/2019   CALCIUM 8.9 03/09/2020   GFRAA 144 03/09/2020    Speciality Comments: No specialty comments available.  Procedures:  No procedures performed Allergies: Patient has no known allergies.   Assessment / Plan:      Visit Diagnoses: No diagnosis found.  Orders: No orders of the defined types were placed in this encounter.  No orders of the defined types were placed in this encounter.   Face-to-face time spent with patient was *** minutes. Greater than 50% of time was spent in counseling and coordination of care.  Follow-Up Instructions: No follow-ups on file.   Ellen Henri, CMA  Note - This record has been created using Animal nutritionist.  Chart creation errors have been sought, but may not always  have been located. Such creation errors do not reflect on  the standard of medical care.

## 2020-06-14 ENCOUNTER — Ambulatory Visit: Payer: BC Managed Care – PPO | Admitting: Rheumatology

## 2020-06-14 DIAGNOSIS — Z8659 Personal history of other mental and behavioral disorders: Secondary | ICD-10-CM

## 2020-06-14 DIAGNOSIS — R5383 Other fatigue: Secondary | ICD-10-CM

## 2020-06-14 DIAGNOSIS — M2550 Pain in unspecified joint: Secondary | ICD-10-CM

## 2020-06-14 DIAGNOSIS — M79641 Pain in right hand: Secondary | ICD-10-CM

## 2020-06-14 DIAGNOSIS — M791 Myalgia, unspecified site: Secondary | ICD-10-CM

## 2020-06-14 DIAGNOSIS — M79671 Pain in right foot: Secondary | ICD-10-CM

## 2020-06-14 DIAGNOSIS — Z8619 Personal history of other infectious and parasitic diseases: Secondary | ICD-10-CM

## 2020-06-14 DIAGNOSIS — G8929 Other chronic pain: Secondary | ICD-10-CM

## 2020-06-14 DIAGNOSIS — Z8269 Family history of other diseases of the musculoskeletal system and connective tissue: Secondary | ICD-10-CM

## 2020-06-14 DIAGNOSIS — Z8709 Personal history of other diseases of the respiratory system: Secondary | ICD-10-CM

## 2020-06-29 ENCOUNTER — Other Ambulatory Visit: Payer: Self-pay

## 2020-06-29 ENCOUNTER — Emergency Department (HOSPITAL_BASED_OUTPATIENT_CLINIC_OR_DEPARTMENT_OTHER): Payer: BC Managed Care – PPO

## 2020-06-29 ENCOUNTER — Encounter (HOSPITAL_BASED_OUTPATIENT_CLINIC_OR_DEPARTMENT_OTHER): Payer: Self-pay

## 2020-06-29 ENCOUNTER — Emergency Department (HOSPITAL_BASED_OUTPATIENT_CLINIC_OR_DEPARTMENT_OTHER)
Admission: EM | Admit: 2020-06-29 | Discharge: 2020-06-29 | Disposition: A | Discharge status: Home / Self Care | Payer: BC Managed Care – PPO | Attending: Emergency Medicine | Admitting: Emergency Medicine

## 2020-06-29 DIAGNOSIS — R519 Headache, unspecified: Secondary | ICD-10-CM | POA: Diagnosis not present

## 2020-06-29 DIAGNOSIS — J45909 Unspecified asthma, uncomplicated: Secondary | ICD-10-CM | POA: Diagnosis not present

## 2020-06-29 DIAGNOSIS — R42 Dizziness and giddiness: Secondary | ICD-10-CM | POA: Diagnosis not present

## 2020-06-29 DIAGNOSIS — R0981 Nasal congestion: Secondary | ICD-10-CM | POA: Insufficient documentation

## 2020-06-29 LAB — URINALYSIS, ROUTINE W REFLEX MICROSCOPIC
Bilirubin Urine: NEGATIVE
Glucose, UA: NEGATIVE mg/dL
Ketones, ur: NEGATIVE mg/dL
Leukocytes,Ua: NEGATIVE
Nitrite: NEGATIVE
Protein, ur: NEGATIVE mg/dL
Specific Gravity, Urine: 1.01 (ref 1.005–1.030)
pH: 7 (ref 5.0–8.0)

## 2020-06-29 LAB — CBC
HCT: 39.2 % (ref 36.0–46.0)
Hemoglobin: 13.1 g/dL (ref 12.0–15.0)
MCH: 29 pg (ref 26.0–34.0)
MCHC: 33.4 g/dL (ref 30.0–36.0)
MCV: 86.7 fL (ref 80.0–100.0)
Platelets: 307 10*3/uL (ref 150–400)
RBC: 4.52 MIL/uL (ref 3.87–5.11)
RDW: 13.2 % (ref 11.5–15.5)
WBC: 15.2 10*3/uL — ABNORMAL HIGH (ref 4.0–10.5)
nRBC: 0 % (ref 0.0–0.2)

## 2020-06-29 LAB — BASIC METABOLIC PANEL
Anion gap: 10 (ref 5–15)
BUN: 15 mg/dL (ref 6–20)
CO2: 25 mmol/L (ref 22–32)
Calcium: 9.3 mg/dL (ref 8.9–10.3)
Chloride: 101 mmol/L (ref 98–111)
Creatinine, Ser: 0.49 mg/dL (ref 0.44–1.00)
GFR, Estimated: >60 mL/min (ref >60)
Glucose, Bld: 94 mg/dL (ref 70–99)
Potassium: 3.9 mmol/L (ref 3.5–5.1)
Sodium: 136 mmol/L (ref 135–145)

## 2020-06-29 LAB — URINALYSIS, MICROSCOPIC (REFLEX)

## 2020-06-29 LAB — PREGNANCY, URINE: Preg Test, Ur: NEGATIVE

## 2020-06-29 MED ORDER — FLUTICASONE PROPIONATE 50 MCG/ACT NA SUSP
1.0000 | Freq: Every day | NASAL | 2 refills | Status: DC
Start: 2020-06-29 — End: 2024-01-11

## 2020-06-29 MED ORDER — AFRIN NASAL SPRAY 0.05 % NA SOLN: 1.0000 | Freq: Two times a day (BID) | NASAL | 0 refills | Status: DC

## 2020-06-29 MED ORDER — SODIUM CHLORIDE 0.9 % IV SOLN: INTRAVENOUS | Status: DC

## 2020-06-29 MED ORDER — SODIUM CHLORIDE 0.9 % IV BOLUS
1000.0000 mL | Freq: Once | INTRAVENOUS | Status: AC
  Administered 2020-06-29: 1000 mL via INTRAVENOUS

## 2020-06-29 NOTE — ED Triage Notes (Signed)
Pt states beginning around christmas time, has been experiencing dizziness and pain in her head intermittently.  Saw pmd, prescribed medication, no longer taking it due to nausea.  Denies visual changes.  States worse today.

## 2020-06-29 NOTE — ED Notes (Signed)
ED Provider at bedside. 

## 2020-06-29 NOTE — ED Provider Notes (Signed)
[facility] EMERGENCY DEPARTMENT Provider Note   CSN: [id number] Arrival date & time: 06/29/20  1032     History Chief Complaint  Patient presents with  . Dizziness    Kristen Davidson is a 30 y.o. female.  Pt presents to the ED today with dizziness and headache.  Pt said she has had an intermittent headache since December.  She said she told her doctor about it and she was put on some anxiety medicine.  She took that in January, but it made her nauseous, so she stopped taking it.  She was diagnosed with strep last week and has taken her abx.  Her throat is feeling better now.  Today, she felt so dizzy ("swimmy headed") that she did not want to drive her kids to school.  She had her mom come over and drive them to school and her here.  Pt's mom has a hx of a brain tumor and she is worried that she has one as well.  No other neurologic sx.         Past Medical History:  Diagnosis Date  . Abnormal Pap smear   . Allergy   . Anxiety   . Asthma   . Headache(784.0)   . PONV (postoperative nausea and vomiting)     Patient Active Problem List   Diagnosis Date Noted  . History of asthma 02/14/2020  . Family history of Sjogren's disease 02/14/2020  . History of anxiety 02/14/2020  . Term pregnancy 10/12/2019  . Pregnancy 09/22/2017  . H. pylori infection 08/18/2012  . Chronic back pain 08/18/2012    Past Surgical History:  Procedure Laterality Date  . CHOLECYSTECTOMY N/A 10/09/2012   Procedure: LAPAROSCOPIC CHOLECYSTECTOMY WITH INTRAOPERATIVE CHOLANGIOGRAM;  Surgeon: Kandis Cocking, MD;  Location: [facility] OR;  Service: General;  Laterality: N/A;  . COLPOSCOPY    . TONSILLECTOMY    . WISDOM TOOTH EXTRACTION       OB History    Gravida  4   Para  3   Term  3   Preterm  0   AB  1   Living  3     SAB  1   IAB  0   Ectopic  0   Multiple  0   Live Births  3           Family History  Problem Relation Age of Onset  . Heart disease Maternal  Grandfather   . Diabetes Maternal Grandfather   . Anxiety disorder Maternal Grandfather   . Hypertension Mother   . Heart disease Mother        PVCs  . Anemia Mother   . Thyroid disease Mother   . Migraines Mother   . Lupus Mother   . Rheum arthritis Mother   . Fibromyalgia Mother   . Hypertension Father   . Heart disease Father   . Diabetes Maternal Aunt   . Kidney disease Maternal Aunt   . Thyroid disease Maternal Grandmother   . Cancer Paternal Grandmother        lymphoma  . Cancer Paternal Grandfather        prostate  . Healthy Brother   . Asthma Daughter   . Healthy Daughter   . Healthy Daughter   . Healthy Daughter   . Anesthesia problems Neg Hx   . Hypotension Neg Hx   . Malignant hyperthermia Neg Hx   . Pseudochol deficiency Neg Hx     Social History  Tobacco Use  . Smoking status: Never Smoker  . Smokeless tobacco: Never Used  Vaping Use  . Vaping Use: Never used  Substance Use Topics  . Alcohol use: Not Currently    Alcohol/week: 0.0 standard drinks    Comment: rarely  . Drug use: No    Home Medications Prior to Admission medications   Medication Sig Start Date End Date Taking? Authorizing Provider  fluticasone (FLONASE) 50 MCG/ACT nasal spray Place 1 spray into both nostrils daily. 06/29/20  Yes Jacalyn Lefevre, MD  oxymetazoline (AFRIN NASAL SPRAY) 0.05 % nasal spray Place 1 spray into both nostrils 2 (two) times daily. Use for 3 days only 06/29/20  Yes Jacalyn Lefevre, MD  albuterol (VENTOLIN HFA) 108 (90 Base) MCG/ACT inhaler Inhale 2 puffs into the lungs every 4 (four) hours as needed for wheezing or shortness of breath. 05/06/19   Leftwich-Kirby, Wilmer Floor, CNM  norethindrone (MICRONOR) 0.35 MG tablet Take 0.35 mg by mouth daily.    [provider]  penicillin v potassium (VEETID) 500 MG tablet Take by mouth. 06/19/20 06/29/20  [provider]    Allergies    Patient has no known allergies.  Review of Systems   Review of Systems   Gastrointestinal: Positive for nausea.  Neurological: Positive for dizziness and headaches.  All other systems reviewed and are negative.   Physical Exam Updated Vital Signs BP 106/60 (BP Location: Right Arm)   Pulse 76   Temp 98.5 F (36.9 C) (Oral)   Resp 16   Ht 5\' 7"  (1.702 m)   Wt 89.4 kg   LMP 06/29/2020   SpO2 100%   BMI 30.85 kg/m   Physical Exam Vitals and nursing note reviewed.  Constitutional:      Appearance: Normal appearance.  HENT:     Head: Normocephalic and atraumatic.     Right Ear: External ear normal.     Left Ear: External ear normal.     Nose: Nose normal.     Mouth/Throat:     Mouth: Mucous membranes are moist.     Pharynx: Oropharynx is clear.  Eyes:     Extraocular Movements: Extraocular movements intact.     Conjunctiva/sclera: Conjunctivae normal.     Pupils: Pupils are equal, round, and reactive to light.  Cardiovascular:     Rate and Rhythm: Normal rate and regular rhythm.     Pulses: Normal pulses.     Heart sounds: Normal heart sounds.  Pulmonary:     Effort: Pulmonary effort is normal.     Breath sounds: Normal breath sounds.  Abdominal:     General: Abdomen is flat. Bowel sounds are normal.     Palpations: Abdomen is soft.  Musculoskeletal:        General: Normal range of motion.     Cervical back: Normal range of motion and neck supple.  Skin:    General: Skin is warm.     Capillary Refill: Capillary refill takes less than 2 seconds.  Neurological:     General: No focal deficit present.     Mental Status: She is alert and oriented to person, place, and time.  Psychiatric:        Mood and Affect: Mood normal.        Behavior: Behavior normal.        Thought Content: Thought content normal.        Judgment: Judgment normal.     ED Results / Procedures / Treatments   Labs (all  labs ordered are listed, but only abnormal results are displayed) Labs Reviewed  CBC - Abnormal; Notable for the following components:       Result Value   WBC 15.2 (*)    All other components within normal limits  URINALYSIS, ROUTINE W REFLEX MICROSCOPIC - Abnormal; Notable for the following components:   Hgb urine dipstick MODERATE (*)    All other components within normal limits  URINALYSIS, MICROSCOPIC (REFLEX) - Abnormal; Notable for the following components:   Bacteria, UA FEW (*)    All other components within normal limits  PREGNANCY, URINE  BASIC METABOLIC PANEL    EKG None  Radiology CT HEAD WO CONTRAST  Result Date: 06/29/2020 CLINICAL DATA:  Headache and dizziness EXAM: CT HEAD WITHOUT CONTRAST TECHNIQUE: Contiguous axial images were obtained from the base of the skull through the vertex without intravenous contrast. COMPARISON:  October 05, 2010 FINDINGS: Brain: Ventricles and sulci are normal in size and configuration. There is no intracranial mass, hemorrhage, extra-axial fluid collection, or midline shift. Brain parenchyma appears unremarkable. No evident acute infarct. Vascular: No hyperdense vessel. No appreciable vascular calcification. Skull: Bony calvarium appears intact. Sinuses/Orbits: There is opacification in an area of posterior ethmoid air cells. Other visualized paranasal sinuses are clear. Visualized orbits appear symmetric bilaterally. Other: Mastoid air cells are clear. IMPRESSION: Focal area of paranasal sinus disease in the posterior right ethmoid air cell complex. Study otherwise unremarkable. Electronically Signed   By: Bretta Bang III M.D.   On: 06/29/2020 11:40    Procedures Procedures   Medications Ordered in ED Medications  sodium chloride 0.9 % bolus 1,000 mL (1,000 mLs Intravenous New Bag/Given 06/29/20 1129)    And  0.9 %  sodium chloride infusion (has no administration in time range)    ED Course  I have reviewed the triage vital signs and the nursing notes.  Pertinent labs & imaging results that were available during my care of the patient were reviewed by me and considered  in my medical decision making (see chart for details).    MDM Rules/Calculators/A&P                          Pt is feeling better after IVFs.  She is on her period now which is why there is some blood in her urine.  No evidence of brain tumor on CT scan.  She does have some sinus disease.  She is d/c with afrin and flonase.  Her husband smokes and she is encouraged to try to get him to stop.  She is to return if worse.  F/u with pcp.   Final Clinical Impression(s) / ED Diagnoses Final diagnoses:  Vertigo  Sinus congestion    Rx / DC Orders ED Discharge Orders         Ordered    fluticasone (FLONASE) 50 MCG/ACT nasal spray  Daily        06/29/20 1159    oxymetazoline (AFRIN NASAL SPRAY) 0.05 % nasal spray  2 times daily        06/29/20 1159           Jacalyn Lefevre, MD 06/29/20 1202

## 2020-06-29 NOTE — ED Triage Notes (Signed)
Pt ambulates without difficulty to room, does report recent treatment for sinus infection and strep, taking antibiotics

## 2020-08-29 ENCOUNTER — Ambulatory Visit
Admission: EM | Admit: 2020-08-29 | Discharge: 2020-08-29 | Discharge status: Against Medical Advice | Payer: BC Managed Care – PPO

## 2020-10-31 ENCOUNTER — Ambulatory Visit
Admission: EM | Admit: 2020-10-31 | Discharge: 2020-10-31 | Disposition: A | Discharge status: Home / Self Care | Payer: BC Managed Care – PPO | Attending: Physician Assistant | Admitting: Physician Assistant

## 2020-10-31 ENCOUNTER — Other Ambulatory Visit: Payer: Self-pay

## 2020-10-31 DIAGNOSIS — J324 Chronic pansinusitis: Secondary | ICD-10-CM | POA: Diagnosis not present

## 2020-10-31 MED ORDER — FLUCONAZOLE 150 MG PO TABS: 150.0000 mg | ORAL_TABLET | Freq: Every day | ORAL | 0 refills | Status: DC

## 2020-10-31 MED ORDER — PREDNISONE 50 MG PO TABS: 50.0000 mg | ORAL_TABLET | Freq: Every day | ORAL | 0 refills | Status: DC

## 2020-10-31 MED ORDER — DOXYCYCLINE HYCLATE 100 MG PO CAPS: 100.0000 mg | ORAL_CAPSULE | Freq: Two times a day (BID) | ORAL | 0 refills | Status: DC

## 2020-10-31 MED ORDER — AZELASTINE HCL 0.1 % NA SOLN: 2.0000 | Freq: Every day | NASAL | 0 refills | Status: DC

## 2020-10-31 NOTE — ED Triage Notes (Signed)
Patient presents to Urgent Care with complaints of sinus pressure for the last months. She states she has a hx of sinus infections. Pt states treating symptoms with OTC allergy meds with some relief.   Denies fever.

## 2020-10-31 NOTE — ED Provider Notes (Signed)
[name]    CSN: [id number] Arrival date & time: 10/31/20  1010      History   Chief Complaint Chief Complaint  Patient presents with   Sinus pressure     HPI Kristen Davidson is a 30 y.o. female.   30 year old female for few month history of sinus pressure. States sinus pressure with sneezing, rhinorrhea, nasal congestion, post nasal drainage that has been getting worse. Denies fever, chills, cough. Has been using otc medicines without relief.   Past Medical History:  Diagnosis Date   Abnormal Pap smear    Allergy    Anxiety    Asthma    Headache(784.0)    PONV (postoperative nausea and vomiting)     Patient Active Problem List   Diagnosis Date Noted   History of asthma 02/14/2020   Family history of Sjogren's disease 02/14/2020   History of anxiety 02/14/2020   Term pregnancy 10/12/2019   Pregnancy 09/22/2017   H. pylori infection 08/18/2012   Chronic back pain 08/18/2012    Past Surgical History:  Procedure Laterality Date   CHOLECYSTECTOMY N/A 10/09/2012   Procedure: LAPAROSCOPIC CHOLECYSTECTOMY WITH INTRAOPERATIVE CHOLANGIOGRAM;  Surgeon: Kandis Cocking, MD;  Location: [facility] OR;  Service: General;  Laterality: N/A;   COLPOSCOPY     TONSILLECTOMY     WISDOM TOOTH EXTRACTION      OB History     Gravida  4   Para  3   Term  3   Preterm  0   AB  1   Living  3      SAB  1   IAB  0   Ectopic  0   Multiple  0   Live Births  3            Home Medications    Prior to Admission medications   Medication Sig Start Date End Date Taking? Authorizing Provider  azelastine (ASTELIN) 0.1 % nasal spray Place 2 sprays into both nostrils daily. 10/31/20  Yes Rakiya Krawczyk V, PA-C  doxycycline (VIBRAMYCIN) 100 MG capsule Take 1 capsule (100 mg total) by mouth 2 (two) times daily. 10/31/20  Yes Mayte Diers V, PA-C  fluconazole (DIFLUCAN) 150 MG tablet Take 1 tablet (150 mg total) by mouth daily. Take second dose 72 hours later if symptoms still  persists. 10/31/20  Yes Alison Breeding V, PA-C  predniSONE (DELTASONE) 50 MG tablet Take 1 tablet (50 mg total) by mouth daily with breakfast. 10/31/20  Yes Shauntae Reitman V, PA-C  albuterol (VENTOLIN HFA) 108 (90 Base) MCG/ACT inhaler Inhale 2 puffs into the lungs every 4 (four) hours as needed for wheezing or shortness of breath. 05/06/19   Leftwich-Kirby, Wilmer Floor, CNM  fluticasone (FLONASE) 50 MCG/ACT nasal spray Place 1 spray into both nostrils daily. 06/29/20   Jacalyn Lefevre, MD  norethindrone (MICRONOR) 0.35 MG tablet Take 0.35 mg by mouth daily.    [provider]  oxymetazoline (AFRIN NASAL SPRAY) 0.05 % nasal spray Place 1 spray into both nostrils 2 (two) times daily. Use for 3 days only 06/29/20   Jacalyn Lefevre, MD    Family History Family History  Problem Relation Age of Onset   Heart disease Maternal Grandfather    Diabetes Maternal Grandfather    Anxiety disorder Maternal Grandfather    Hypertension Mother    Heart disease Mother        PVCs   Anemia Mother    Thyroid disease Mother  Migraines Mother    Lupus Mother    Rheum arthritis Mother    Fibromyalgia Mother    Hypertension Father    Heart disease Father    Diabetes Maternal Aunt    Kidney disease Maternal Aunt    Thyroid disease Maternal Grandmother    Cancer Paternal Grandmother        lymphoma   Cancer Paternal Grandfather        prostate   Healthy Brother    Asthma Daughter    Healthy Daughter    Healthy Daughter    Healthy Daughter    Anesthesia problems Neg Hx    Hypotension Neg Hx    Malignant hyperthermia Neg Hx    Pseudochol deficiency Neg Hx     Social History Social History   Tobacco Use   Smoking status: Never   Smokeless tobacco: Never  Vaping Use   Vaping Use: Never used  Substance Use Topics   Alcohol use: Not Currently    Alcohol/week: 0.0 standard drinks    Comment: rarely   Drug use: No     Allergies   Patient has no known allergies.   Review of Systems Review of Systems   Reason unable to perform ROS: See HPI as above.    Physical Exam Triage Vital Signs ED Triage Vitals  Enc Vitals Group     BP 10/31/20 1018 121/66     Pulse Rate 10/31/20 1018 83     Resp 10/31/20 1018 16     Temp 10/31/20 1018 97.7 F (36.5 C)     Temp Source 10/31/20 1018 Temporal     SpO2 10/31/20 1018 95 %     Weight --      Height --      Head Circumference --      Peak Flow --      Pain Score 10/31/20 1021 0     Pain Loc --      Pain Edu? --      Excl. in GC? --    No data found.  Updated Vital Signs BP 121/66 (BP Location: Left Arm)   Pulse 83   Temp 97.7 F (36.5 C) (Temporal)   Resp 16   LMP 10/30/2020   SpO2 95%    Physical Exam Constitutional:      General: She is not in acute distress.    Appearance: Normal appearance. She is well-developed. She is not ill-appearing, toxic-appearing or diaphoretic.  HENT:     Head: Normocephalic and atraumatic.     Right Ear: Ear canal and external ear normal. Tympanic membrane is erythematous. Tympanic membrane is not bulging.     Left Ear: Ear canal and external ear normal. Tympanic membrane is erythematous. Tympanic membrane is not bulging.     Nose:     Right Sinus: Maxillary sinus tenderness and frontal sinus tenderness present.     Left Sinus: Maxillary sinus tenderness and frontal sinus tenderness present.     Mouth/Throat:     Mouth: Mucous membranes are moist.     Pharynx: Oropharynx is clear. Uvula midline.  Eyes:     Conjunctiva/sclera: Conjunctivae normal.     Pupils: Pupils are equal, round, and reactive to light.  Cardiovascular:     Rate and Rhythm: Normal rate and regular rhythm.  Pulmonary:     Effort: Pulmonary effort is normal. No accessory muscle usage, prolonged expiration, respiratory distress or retractions.     Breath sounds: No decreased air movement or transmitted  upper airway sounds. No decreased breath sounds.     Comments: LCTAB Musculoskeletal:     Cervical back: Normal range of  motion and neck supple.  Skin:    General: Skin is warm and dry.  Neurological:     Mental Status: She is alert and oriented to person, place, and time.     UC Treatments / Results  Labs (all labs ordered are listed, but only abnormal results are displayed) Labs Reviewed - No data to display  EKG   Radiology No results found.  Procedures Procedures (including critical care time)  Medications Ordered in UC Medications - No data to display  Initial Impression / Assessment and Plan / UC Course  I have reviewed the triage vital signs and the nursing notes.  Pertinent labs & imaging results that were available during my care of the patient were reviewed by me and considered in my medical decision making (see chart for details).    Patient afebrile, nontoxic. Stable vitals. History and exam consistent with sinusitis. Start doxycycline and prednisone as directed. Other symptomatic treatment discussed. Return precautions given.   Final Clinical Impressions(s) / UC Diagnoses   Final diagnoses:  Chronic pansinusitis    ED Prescriptions     Medication Sig Dispense Auth. Provider   doxycycline (VIBRAMYCIN) 100 MG capsule Take 1 capsule (100 mg total) by mouth 2 (two) times daily. 14 capsule Bartosz Luginbill V, PA-C   predniSONE (DELTASONE) 50 MG tablet Take 1 tablet (50 mg total) by mouth daily with breakfast. 5 tablet Azaria Stegman V, PA-C   fluconazole (DIFLUCAN) 150 MG tablet Take 1 tablet (150 mg total) by mouth daily. Take second dose 72 hours later if symptoms still persists. 2 tablet Sashia Campas V, PA-C   azelastine (ASTELIN) 0.1 % nasal spray Place 2 sprays into both nostrils daily. 30 mL Belinda Fisher, PA-C      PDMP not reviewed this encounter.   Belinda Fisher, PA-C 10/31/20 [phone]

## 2020-10-31 NOTE — Discharge Instructions (Signed)
Start doxycycline as directed Start prednisone as directed, you can wait till tomorrow first thing in the morning to start Start azelastine nasal spray as directed Over the counter nasal saline rinse such as netipot  Follow up with PCP for further evaluation if symptoms not improving

## 2020-11-03 ENCOUNTER — Other Ambulatory Visit: Payer: Self-pay

## 2020-11-03 ENCOUNTER — Encounter: Payer: Self-pay | Admitting: Emergency Medicine

## 2020-11-03 ENCOUNTER — Ambulatory Visit
Admission: EM | Admit: 2020-11-03 | Discharge: 2020-11-03 | Disposition: A | Discharge status: Home / Self Care | Payer: BC Managed Care – PPO | Attending: Emergency Medicine | Admitting: Emergency Medicine

## 2020-11-03 DIAGNOSIS — B349 Viral infection, unspecified: Secondary | ICD-10-CM | POA: Diagnosis not present

## 2020-11-03 DIAGNOSIS — J324 Chronic pansinusitis: Secondary | ICD-10-CM

## 2020-11-03 MED ORDER — BENZONATATE 100 MG PO CAPS: 100.0000 mg | ORAL_CAPSULE | Freq: Three times a day (TID) | ORAL | 0 refills | Status: AC

## 2020-11-03 NOTE — Discharge Instructions (Addendum)
We will call you with any positive results from your COVID-19 and influenza testing completed in clinic today.  If you do not receive a phone call from us within the next 2-3 days, check your MyChart for up-to-date health information related to testing completed in clinic today.   For most people this is a self-limiting process and can take anywhere from 7 - 10 days to start feeling better. A cough can last up to 3 weeks. Pay special attention to handwashing as this can help prevent the spread of the virus.   Always read the labels of cough and cold medications as they may contain some of the ingredients below.  Rest, push lots of fluids (especially water), and utilize supportive care for symptoms. You may take acetaminophen (Tylenol) every 4-6 hours and ibuprofen every 6-8 hours for muscle pain, joint pain, headaches (you may also alternate these medications). Mucinex (guaifenesin) may be taken over the counter for cough as needed can loosen phlegm. Please read the instructions and take as directed.  Sudafed (pseudophedrine) is sold behind the counter and can help reduce nasal pressure; avoid taking this if you have high blood pressure or feel jittery. Sudafed PE (phenylephrine) can be a helpful, short-term, over-the-counter alternative to limit side effects or if you have high blood pressure.  Flonase nasal spray can help alleviate congestion and sinus pressure. Many patients choose Afrin as a nasal decongestant; do not use for more than 3 days for risk of rebound (increased symptoms after stopping medication).  Saline nasal sprays or rinses can also help nasal congestion (use bottled or sterile water). Warm tea with lemon and honey can sooth sore throat and cough, as can cough drops.   Return to clinic for high fever not improving with medications, chest pain, difficulty breathing, non-stop vomiting, or coughing blood. Follow-up with your primary care provider if symptoms do not improve as  expected in the next 5-7 days.  

## 2020-11-03 NOTE — ED Provider Notes (Signed)
CHIEF COMPLAINT:   Chief Complaint  Patient presents with   Headache   Generalized Body Aches     SUBJECTIVE/HPI:   Headache A very pleasant 29 y.o.Female presents today with headache, generalized body aches, hoarseness and nonproductive cough which started 4 days ago.  Patient reports being exposed to COVID-19 by her husband.  Patient was seen in clinic on Tuesday and was prescribed doxycycline, prednisone, Diflucan and Astelin for a presumed pansinusitis that have been going on for several months.  Patient reports that the medication has helped somewhat, but has not relieved symptoms completely.. Patient does not report any shortness of breath, chest pain, palpitations, visual changes, weakness, tingling, headache, nausea, vomiting, diarrhea, fever, chills.   has a past medical history of Abnormal Pap smear, Allergy, Anxiety, Asthma, Headache(784.0), and PONV (postoperative nausea and vomiting).  ROS:  Review of Systems  Neurological:  Positive for headaches.  See Subjective/HPI Medications, Allergies and Problem List personally reviewed in Epic today OBJECTIVE:   Vitals:   11/03/20 0814  BP: 112/77  Pulse: (!) 110  Resp: 15  Temp: 99.2 F (37.3 C)  SpO2: 96%    Physical Exam   General: Appears well-developed and well-nourished. No acute distress.  HEENT Head: Normocephalic and atraumatic. Ears: Hearing grossly intact, no drainage or visible deformity.  Nose: No nasal deviation.   Mouth/Throat: No stridor or tracheal deviation.  Non erythematous posterior pharynx noted with clear drainage present.  No white patchy exudate noted. Eyes: Conjunctivae and EOM are normal. No eye drainage or scleral icterus bilaterally.  Neck: Normal range of motion, neck is supple.  Cardiovascular: Tachycardia. Regular rhythm; no murmurs, gallops, or rubs.  Pulm/Chest: No respiratory distress. Breath sounds normal bilaterally without wheezes, rhonchi, or rales.  Neurological: Alert and  oriented to person, place, and time.  Skin: Skin is warm and dry.  No rashes, lesions, abrasions or bruising noted to skin.   Psychiatric: Normal mood, affect, behavior, and thought content.   Vital signs and nursing note reviewed.   Patient stable and cooperative with examination. PROCEDURES:    LABS/X-RAYS/EKG/MEDS:    No results found for any visits on 11/03/20.  MEDICAL DECISION MAKING:   Patient presents with headache, generalized body aches, hoarseness and nonproductive cough which started 4 days ago.  Patient reports being exposed to COVID-19 by her husband.  Patient was seen in clinic on Tuesday and was prescribed doxycycline, prednisone, Diflucan and Astelin for a presumed pansinusitis that have been going on for several months.  Patient reports that the medication has helped somewhat, but has not relieved symptoms completely.. Patient does not report any shortness of breath, chest pain, palpitations, visual changes, weakness, tingling, headache, nausea, vomiting, diarrhea, fever, chills.  Chart review completed.  On review, patient was started on doxycycline, prednisone, Diflucan and Astelin on Tuesday.  Patient reports that her symptoms seem to change around Monday or Tuesday and her husband tested positive for COVID-19 at home.  Likely, viral illness today.  Patient reports that her cough is more of a "tickle" in her throat, Rx'd Tessalon Perles to the patient's preferred pharmacy to help with her cough.  Work note provided.  Explained to the patient that given her several months of nasal congestion before presenting to clinic on Tuesday that she may have pansinusitis as well, explained to continue medications as prescribed from that visit which include doxycycline, prednisone, Diflucan and Astelin.  Did obtain and send out a COVID-19 PCR and influenza test for the patient given her  recent exposure to COVID-19.  Explained that although she has received a negative COVID-19 test at home  her PCR could still be positive.  Explained quarantine precautions.  Explained that we would call with any positive results and negative results will be uploaded directly to her [facility] MyChart.  Advised about home treatment and care as outlined in her AVS to include over-the-counter remedies.  Return as needed.  Patient verbalized understanding and agreed with treatment plan.  Patient stable upon discharge. ASSESSMENT/PLAN:  1. Viral illness - Covid-19, Flu A+B ([facility]); Standing - Covid-19, Flu A+B ([facility]) - benzonatate (TESSALON) 100 MG capsule; Take 1 capsule (100 mg total) by mouth every 8 (eight) hours for 7 days.  Dispense: 21 capsule; Refill: 0  2. Chronic pansinusitis   Instructions about new medications and side effects provided.  Plan:   Discharge Instructions      We will call you with any positive results from your COVID-19 and influenza testing completed in clinic today.  If you do not receive a phone call from [place] within the next 2-3 days, check your MyChart for up-to-date health information related to testing completed in clinic today.  For most people this is a self-limiting process and can take anywhere from 7 - 10 days to start feeling better. A cough can last up to 3 weeks. Pay special attention to handwashing as this can help prevent the spread of the virus.   Always read the labels of cough and cold medications as they may contain some of the ingredients below.  Rest, push lots of fluids (especially water), and utilize supportive care for symptoms. You may take acetaminophen (Tylenol) every 4-6 hours and ibuprofen every 6-8 hours for muscle pain, joint pain, headaches (you may also alternate these medications). Mucinex (guaifenesin) may be taken over the counter for cough as needed can loosen phlegm. Please read the instructions and take as directed.  Sudafed (pseudophedrine) is sold behind the counter and can help reduce nasal pressure; avoid taking this if  you have high blood pressure or feel jittery. Sudafed PE (phenylephrine) can be a helpful, short-term, over-the-counter alternative to limit side effects or if you have high blood pressure.  Flonase nasal spray can help alleviate congestion and sinus pressure. Many patients choose Afrin as a nasal decongestant; do not use for more than 3 days for risk of rebound (increased symptoms after stopping medication).  Saline nasal sprays or rinses can also help nasal congestion (use bottled or sterile water). Warm tea with lemon and honey can sooth sore throat and cough, as can cough drops.   Return to clinic for high fever not improving with medications, chest pain, difficulty breathing, non-stop vomiting, or coughing blood. Follow-up with your primary care provider if symptoms do not improve as expected in the next 5-7 days.          Amalia Greenhouse, [place] 11/03/20 [phone]

## 2020-11-03 NOTE — ED Triage Notes (Signed)
Patient c/o headache and generalized body aches x 4 days.   Patient denies fever at home.   Patient endorses being hoarse and nonproductive cough.   Patient has been exposed to COVID (husband).   Patient was seen at this clinic on Tuesday.   Patient has been given Prednisone and Antibiotics at this clinic earlier this work w/ some relief of symptoms but "not enough relief".

## 2020-11-05 LAB — COVID-19, FLU A+B NAA
Influenza A, NAA: NOT DETECTED
Influenza B, NAA: NOT DETECTED
SARS-CoV-2, NAA: DETECTED — AB

## 2020-12-06 ENCOUNTER — Emergency Department (HOSPITAL_BASED_OUTPATIENT_CLINIC_OR_DEPARTMENT_OTHER): Payer: Medicaid Other

## 2020-12-06 ENCOUNTER — Emergency Department (HOSPITAL_BASED_OUTPATIENT_CLINIC_OR_DEPARTMENT_OTHER)
Admission: EM | Admit: 2020-12-06 | Discharge: 2020-12-06 | Disposition: A | Discharge status: Home / Self Care | Payer: Medicaid Other | Attending: Emergency Medicine | Admitting: Emergency Medicine

## 2020-12-06 ENCOUNTER — Other Ambulatory Visit: Payer: Self-pay

## 2020-12-06 ENCOUNTER — Encounter (HOSPITAL_BASED_OUTPATIENT_CLINIC_OR_DEPARTMENT_OTHER): Payer: Self-pay

## 2020-12-06 DIAGNOSIS — J45909 Unspecified asthma, uncomplicated: Secondary | ICD-10-CM | POA: Diagnosis not present

## 2020-12-06 DIAGNOSIS — R42 Dizziness and giddiness: Secondary | ICD-10-CM

## 2020-12-06 DIAGNOSIS — R55 Syncope and collapse: Secondary | ICD-10-CM | POA: Insufficient documentation

## 2020-12-06 DIAGNOSIS — R519 Headache, unspecified: Secondary | ICD-10-CM | POA: Insufficient documentation

## 2020-12-06 DIAGNOSIS — Z7951 Long term (current) use of inhaled steroids: Secondary | ICD-10-CM | POA: Diagnosis not present

## 2020-12-06 DIAGNOSIS — R079 Chest pain, unspecified: Secondary | ICD-10-CM | POA: Insufficient documentation

## 2020-12-06 LAB — BASIC METABOLIC PANEL
Anion gap: 9 (ref 5–15)
BUN: 13 mg/dL (ref 6–20)
CO2: 26 mmol/L (ref 22–32)
Calcium: 9.2 mg/dL (ref 8.9–10.3)
Chloride: 101 mmol/L (ref 98–111)
Creatinine, Ser: 0.55 mg/dL (ref 0.44–1.00)
GFR, Estimated: >60 mL/min (ref >60)
Glucose, Bld: 94 mg/dL (ref 70–99)
Potassium: 3.6 mmol/L (ref 3.5–5.1)
Sodium: 136 mmol/L (ref 135–145)

## 2020-12-06 LAB — CBC
HCT: 42.1 % (ref 36.0–46.0)
Hemoglobin: 14 g/dL (ref 12.0–15.0)
MCH: 29.7 pg (ref 26.0–34.0)
MCHC: 33.3 g/dL (ref 30.0–36.0)
MCV: 89.4 fL (ref 80.0–100.0)
Platelets: 292 10*3/uL (ref 150–400)
RBC: 4.71 MIL/uL (ref 3.87–5.11)
RDW: 12.6 % (ref 11.5–15.5)
WBC: 11.4 10*3/uL — ABNORMAL HIGH (ref 4.0–10.5)
nRBC: 0 % (ref 0.0–0.2)

## 2020-12-06 LAB — URINALYSIS, ROUTINE W REFLEX MICROSCOPIC
Bilirubin Urine: NEGATIVE
Glucose, UA: NEGATIVE mg/dL
Hgb urine dipstick: NEGATIVE
Ketones, ur: NEGATIVE mg/dL
Leukocytes,Ua: NEGATIVE
Nitrite: NEGATIVE
Protein, ur: NEGATIVE mg/dL
Specific Gravity, Urine: 1.005 (ref 1.005–1.030)
pH: 6 (ref 5.0–8.0)

## 2020-12-06 LAB — TROPONIN I (HIGH SENSITIVITY)
Troponin I (High Sensitivity): <2 ng/L (ref <18)
Troponin I (High Sensitivity): <2 ng/L (ref <18)

## 2020-12-06 LAB — PREGNANCY, URINE: Preg Test, Ur: NEGATIVE

## 2020-12-06 MED ORDER — DIPHENHYDRAMINE HCL 50 MG/ML IJ SOLN
50.0000 mg | Freq: Once | INTRAMUSCULAR | Status: AC
  Administered 2020-12-06: 50 mg via INTRAVENOUS
  Filled 2020-12-06: qty 1

## 2020-12-06 MED ORDER — METOCLOPRAMIDE HCL 5 MG/ML IJ SOLN
10.0000 mg | Freq: Once | INTRAMUSCULAR | Status: AC
  Administered 2020-12-06: 10 mg via INTRAVENOUS
  Filled 2020-12-06: qty 2

## 2020-12-06 MED ORDER — SODIUM CHLORIDE 0.9 % IV BOLUS
1000.0000 mL | Freq: Once | INTRAVENOUS | Status: AC
  Administered 2020-12-06: 1000 mL via INTRAVENOUS

## 2020-12-06 MED ORDER — MECLIZINE HCL 25 MG PO TABS: 25.0000 mg | ORAL_TABLET | Freq: Three times a day (TID) | ORAL | 0 refills | Status: DC | PRN

## 2020-12-06 MED ORDER — MECLIZINE HCL 25 MG PO TABS
25.0000 mg | ORAL_TABLET | Freq: Once | ORAL | Status: AC
  Administered 2020-12-06: 25 mg via ORAL
  Filled 2020-12-06: qty 1

## 2020-12-06 MED ORDER — KETOROLAC TROMETHAMINE 15 MG/ML IJ SOLN
15.0000 mg | Freq: Once | INTRAMUSCULAR | Status: AC
  Administered 2020-12-06: 15 mg via INTRAVENOUS
  Filled 2020-12-06: qty 1

## 2020-12-06 NOTE — ED Triage Notes (Signed)
Pt states she was seated at work when she had sudden onset ~1245pm of near syncope, dizziness, HA to left forehead and temporal area and pain to left UE-NAD-to triage in w/c

## 2020-12-06 NOTE — Discharge Instructions (Addendum)
Your workup was very reassuring at this time without any abnormalities in the lab work.  Please pick up medication and take as needed for room spinning dizziness as you may be experiencing signs of vertigo.  I would recommend following up with [facility] for further evaluation of your intermittent headache/dizziness for the past few months.  Please also follow up with your PCP regarding ED visit today.   Return to the ED IMMEDIATELY for any new/worsening symptoms

## 2020-12-06 NOTE — ED Provider Notes (Signed)
[facility] EMERGENCY DEPARTMENT Provider Note   CSN: [id number] Arrival date & time: 12/06/20  1322     History Chief Complaint  Patient presents with   Near Syncope    Kristen Davidson is a 30 y.o. female who presents to the ED today with complaint of near syncope/room spinning dizziness that began around 11:45 AM this morning. Pt reports that she was sitting down at work when she had sudden onset dizziness that worsened with head movement. She reports she was sitting at a bar stool so her co-worker helped her down into a regular chair. She then began having some substernal chest tightness, left arm pain, and left sided headache that lasted a couple of minutes. Her mom is at bedside and states she "didn't seem right to co-workers." Mom reports when she came to get patient she seemed "off" however mom reports she is unsure if this was due to the pain she was in. She did not notice any speech changes. They gave her a baby aspirin and took her blood pressure and states it was "high" at 129 systolic. Mom decided to bring her to the ED for further evaluation. Pt reports she still has a slight headache and some slight chest pain. She denies numbness to her left arm however states it felt "heavy." She denies speech changes, vision changes, vomiting, shortness of breath, or any other associated symptoms.   The history is provided by the patient, medical records and a parent.      Past Medical History:  Diagnosis Date   Abnormal Pap smear    Allergy    Anxiety    Asthma    Headache(784.0)    PONV (postoperative nausea and vomiting)     Patient Active Problem List   Diagnosis Date Noted   History of asthma 02/14/2020   Family history of Sjogren's disease 02/14/2020   History of anxiety 02/14/2020   Term pregnancy 10/12/2019   Pregnancy 09/22/2017   H. pylori infection 08/18/2012   Chronic back pain 08/18/2012    Past Surgical History:  Procedure Laterality Date    CHOLECYSTECTOMY N/A 10/09/2012   Procedure: LAPAROSCOPIC CHOLECYSTECTOMY WITH INTRAOPERATIVE CHOLANGIOGRAM;  Surgeon: Kandis Cockingavid H Newman, MD;  Location: [facility] OR;  Service: General;  Laterality: N/A;   COLPOSCOPY     TONSILLECTOMY     WISDOM TOOTH EXTRACTION       OB History     Gravida  4   Para  3   Term  3   Preterm  0   AB  1   Living  3      SAB  1   IAB  0   Ectopic  0   Multiple  0   Live Births  3           Family History  Problem Relation Age of Onset   Heart disease Maternal Grandfather    Diabetes Maternal Grandfather    Anxiety disorder Maternal Grandfather    Hypertension Mother    Heart disease Mother        PVCs   Anemia Mother    Thyroid disease Mother    Migraines Mother    Lupus Mother    Rheum arthritis Mother    Fibromyalgia Mother    Hypertension Father    Heart disease Father    Diabetes Maternal Aunt    Kidney disease Maternal Aunt    Thyroid disease Maternal Grandmother    Cancer Paternal Grandmother  lymphoma   Cancer Paternal Grandfather        prostate   Healthy Brother    Asthma Daughter    Healthy Daughter    Healthy Daughter    Healthy Daughter    Anesthesia problems Neg Hx    Hypotension Neg Hx    Malignant hyperthermia Neg Hx    Pseudochol deficiency Neg Hx     Social History   Tobacco Use   Smoking status: Never   Smokeless tobacco: Never  Vaping Use   Vaping Use: Never used  Substance Use Topics   Alcohol use: Yes    Comment: rarely   Drug use: No    Home Medications Prior to Admission medications   Medication Sig Start Date End Date Taking? Authorizing Provider  meclizine (ANTIVERT) 25 MG tablet Take 1 tablet (25 mg total) by mouth 3 (three) times daily as needed for dizziness. 12/06/20  Yes Lexi Conaty, PA-C  albuterol (VENTOLIN HFA) 108 (90 Base) MCG/ACT inhaler Inhale 2 puffs into the lungs every 4 (four) hours as needed for wheezing or shortness of breath. 05/06/19   Leftwich-Kirby, Wilmer Floor,  CNM  azelastine (ASTELIN) 0.1 % nasal spray Place 2 sprays into both nostrils daily. 10/31/20   Cathie Hoops, Amy V, PA-C  doxycycline (VIBRAMYCIN) 100 MG capsule Take 1 capsule (100 mg total) by mouth 2 (two) times daily. 10/31/20   Cathie Hoops, Amy V, PA-C  fluconazole (DIFLUCAN) 150 MG tablet Take 1 tablet (150 mg total) by mouth daily. Take second dose 72 hours later if symptoms still persists. 10/31/20   Cathie Hoops, Amy V, PA-C  fluticasone (FLONASE) 50 MCG/ACT nasal spray Place 1 spray into both nostrils daily. 06/29/20   Jacalyn Lefevre, MD  norethindrone (MICRONOR) 0.35 MG tablet Take 0.35 mg by mouth daily.    [provider]  oxymetazoline (AFRIN NASAL SPRAY) 0.05 % nasal spray Place 1 spray into both nostrils 2 (two) times daily. Use for 3 days only 06/29/20   Jacalyn Lefevre, MD  predniSONE (DELTASONE) 50 MG tablet Take 1 tablet (50 mg total) by mouth daily with breakfast. 10/31/20   Belinda Fisher, PA-C    Allergies    Patient has no known allergies.  Review of Systems   Review of Systems  Constitutional:  Negative for appetite change and fever.  Eyes:  Negative for visual disturbance.  Respiratory:  Negative for shortness of breath.   Cardiovascular:  Positive for chest pain.  Gastrointestinal:  Positive for nausea. Negative for vomiting.  Neurological:  Positive for dizziness (room spinning) and headaches. Negative for syncope, weakness and numbness.  All other systems reviewed and are negative.  Physical Exam Updated Vital Signs BP 131/74 (BP Location: Right Arm)   Pulse 74   Temp 98.1 F (36.7 C) (Oral)   Resp 18   Ht 5\' 7"  (1.702 m)   Wt 77.1 kg   LMP 12/01/2020   SpO2 99%   BMI 26.63 kg/m   Physical Exam Vitals and nursing note reviewed.  Constitutional:      Appearance: She is not ill-appearing or diaphoretic.  HENT:     Head: Normocephalic and atraumatic.  Eyes:     Extraocular Movements: Extraocular movements intact.     Conjunctiva/sclera: Conjunctivae normal.     Pupils: Pupils  are equal, round, and reactive to light.  Cardiovascular:     Rate and Rhythm: Normal rate and regular rhythm.     Pulses: Normal pulses.  Pulmonary:     Effort: Pulmonary effort  is normal.     Breath sounds: Normal breath sounds. No wheezing, rhonchi or rales.  Abdominal:     Palpations: Abdomen is soft.     Tenderness: There is no abdominal tenderness.  Musculoskeletal:     Cervical back: Neck supple.  Skin:    General: Skin is warm and dry.  Neurological:     Mental Status: She is alert.     Comments: Alert and oriented to self, place, time and event.   Speech is fluent, clear without dysarthria or dysphasia.   Strength 5/5 in upper/lower extremities   Sensation intact in upper/lower extremities   Normal gait.  Negative Romberg. No pronator drift.  Normal finger-to-nose and feet tapping.  CN I not tested  CN II grossly intact visual fields bilaterally. Did not visualize posterior eye.  CN III, IV, VI PERRLA and EOMs intact bilaterally  CN V Intact sensation to sharp and light touch to the face  CN VII facial movements symmetric  CN VIII not tested  CN IX, X no uvula deviation, symmetric rise of soft palate  CN XI 5/5 SCM and trapezius strength bilaterally  CN XII Midline tongue protrusion, symmetric L/R movements      ED Results / Procedures / Treatments   Labs (all labs ordered are listed, but only abnormal results are displayed) Labs Reviewed  CBC - Abnormal; Notable for the following components:      Result Value   WBC 11.4 (*)    All other components within normal limits  URINALYSIS, ROUTINE W REFLEX MICROSCOPIC - Abnormal; Notable for the following components:   Color, Urine STRAW (*)    All other components within normal limits  BASIC METABOLIC PANEL  PREGNANCY, URINE  TROPONIN I (HIGH SENSITIVITY)  TROPONIN I (HIGH SENSITIVITY)    EKG EKG Interpretation  Date/Time:  Wednesday December 06 2020 13:45:17 EDT Ventricular Rate:  70 PR  Interval:  138 QRS Duration: 102 QT Interval:  409 QTC Calculation: 442 R Axis:   56 Text Interpretation: Sinus rhythm Confirmed by Gloris Manchester [phone]) on 12/06/2020 2:43:43 PM  Radiology DG Chest 2 View  Result Date: 12/06/2020 CLINICAL DATA:  Chest pain. EXAM: CHEST - 2 VIEW COMPARISON:  July 23, 2014. FINDINGS: The heart size and mediastinal contours are within normal limits. Both lungs are clear. The visualized skeletal structures are unremarkable. IMPRESSION: No active cardiopulmonary disease. Electronically Signed   By: Lupita Raider M.D.   On: 12/06/2020 15:02    Procedures Procedures   Medications Ordered in ED Medications  sodium chloride 0.9 % bolus 1,000 mL ( Intravenous Stopped 12/06/20 1610)  meclizine (ANTIVERT) tablet 25 mg (25 mg Oral Given 12/06/20 1511)  ketorolac (TORADOL) 15 MG/ML injection 15 mg (15 mg Intravenous Given 12/06/20 1620)  metoCLOPramide (REGLAN) injection 10 mg (10 mg Intravenous Given 12/06/20 1620)  diphenhydrAMINE (BENADRYL) injection 50 mg (50 mg Intravenous Given 12/06/20 1621)    ED Course  I have reviewed the triage vital signs and the nursing notes.  Pertinent labs & imaging results that were available during my care of the patient were reviewed by me and considered in my medical decision making (see chart for details).    MDM Rules/Calculators/A&P                           30 year old female presenting to the ED today with complaint of room spinning dizziness, chest pain, LUE pain, and headache that began  earlier today. On arrival to the ED VSS. Pt is afebrile, nontachycardic, and nontachypneic. Blood pressure 131/74 initially with repeat 116/73. On my exam she is resting comfortably and appears to be in NAD. She has no focal neuro deficits on exam at this time. She has equal pulses bilaterally. EKG with NSR and no acute ischemic changes compared to previous in 2020. Labs have been collected prior to being seen including CBC, BMP, Troponin. CBC with  slight increase in WBC at 11,400. Hgb stable at 14.0. BMP without electrolyte abnormalities. Troponin < 2. Will add on CXR at this time given complaint of chest pain. Will also obtain orthostatic vitals signs and check blood pressures in both arms. Given age, positive symptoms, and no neuro deficits I have very low suspicion for acute intracranial abnormality including stroke or vertebral artery dissection. I also have very low suspicion for aortic dissection given she is comfortable appearing with equal pulses. It does appear pt has hx of vertigo in the past and she is describing a room spinning dizziness at this time. Will provide fluids and meclizine. Discussed case with attending physician Dr. Durwin Nora who agrees with plan.   CXR clear without signs of acute pulmonary abnormality or widened mediastinum  Orthostatics:   Orthostatic Standing at 0 minutes BP- Standing at 0 minutes: 117/81 Pulse- Standing at 0 minutes: 82    Orthostatic Vital Signs Orthostatic Sitting BP- Sitting: 104/65 Pulse- Sitting: 72    Orthostatic Vital Signs Orthostatic Lying  BP- Lying: 112/68 Pulse- Lying: 84   Repeat troponin of < 2 On reevaluation pt reports improving in her dizziness however still has mild headache. Will provide headache cocktail at this time.   Headache has improved after headache cocktail. Vitals continued to be stable. Pt reports she went through MyChart and noticed she was seen in the past for headache and dizziness. She reports it has been intermittent since probably December. Question migraine vs vertigo. Will discharge with plans to follow up with neurology for same. Will discharge with meclizine given it seems to have helped her symptoms. She is instructed to return to the ED for any new/worsening symptoms. Mom and pt in agreement with plan and they are stable for discharge home.   This note was prepared using Dragon voice recognition software and may include unintentional dictation errors due to  the inherent limitations of voice recognition software.  Final Clinical Impression(s) / ED Diagnoses Final diagnoses:  Bad headache  Dizziness  Vertigo  Nonspecific chest pain    Rx / DC Orders ED Discharge Orders          Ordered    meclizine (ANTIVERT) 25 MG tablet  3 times daily PRN        12/06/20 1648             Discharge Instructions      Your workup was very reassuring at this time without any abnormalities in the lab work.  Please pick up medication and take as needed for room spinning dizziness as you may be experiencing signs of vertigo.  I would recommend following up with [facility] for further evaluation of your intermittent headache/dizziness for the past few months.  Please also follow up with your PCP regarding ED visit today.   Return to the ED IMMEDIATELY for any new/worsening symptoms       Tanda Rockers, Cordelia Poche 12/06/20 1650    Gloris Manchester, MD 12/07/20 1901

## 2020-12-06 NOTE — ED Notes (Signed)
Patient transported to X-ray 

## 2021-01-03 ENCOUNTER — Encounter: Payer: Self-pay | Admitting: Emergency Medicine

## 2021-01-03 ENCOUNTER — Ambulatory Visit
Admission: EM | Admit: 2021-01-03 | Discharge: 2021-01-03 | Disposition: A | Discharge status: Home / Self Care | Payer: Medicaid Other | Attending: Emergency Medicine | Admitting: Emergency Medicine

## 2021-01-03 ENCOUNTER — Other Ambulatory Visit: Payer: Self-pay

## 2021-01-03 DIAGNOSIS — R42 Dizziness and giddiness: Secondary | ICD-10-CM | POA: Diagnosis not present

## 2021-01-03 DIAGNOSIS — H6983 Other specified disorders of Eustachian tube, bilateral: Secondary | ICD-10-CM

## 2021-01-03 DIAGNOSIS — R0981 Nasal congestion: Secondary | ICD-10-CM

## 2021-01-03 MED ORDER — AZELASTINE HCL 0.1 % NA SOLN: 2.0000 | Freq: Two times a day (BID) | NASAL | 0 refills | Status: DC

## 2021-01-03 MED ORDER — MECLIZINE HCL 25 MG PO TABS: 25.0000 mg | ORAL_TABLET | Freq: Three times a day (TID) | ORAL | 0 refills | Status: DC | PRN

## 2021-01-03 NOTE — ED Triage Notes (Signed)
Pt here with recurrent vertigo over the last year. Was seen recently in the ED and given medication for management of sx, but does not have enough to last until her doctor appt. Does not want COVID test, but does wake up with congestion and ear fullness.

## 2021-01-03 NOTE — Discharge Instructions (Addendum)
You may use nondrowsy Dramamine in the lieu of meclizine during the day when you are having episodes of vertigo.  This medication is not as sedating as the meclizine.  Increase fluid intake.  Begin using a daily Zyrtec tablet to help with your allergies.  Begin taking Mucinex as directed.  If you begin to have any worsening of symptoms to include vomiting, worsening dizziness, fever, chills, severe facial pain/pressure please return for reevaluation.

## 2021-01-03 NOTE — ED Provider Notes (Signed)
Chief Complaint   Chief Complaint  Patient presents with   Dizziness   Nasal Congestion     Subjective, HPI  MAECI KALBFLEISCH is a 30 y.o. female who presents with recurrent vertigo.  Patient reports having several episodes over the last year. Was recently seen in the ED and was given medication to manage symptoms, but reports that she does not have enough of this medication to last until her doctor's appointment.  Patient also reports that yesterday she began sneezing and this morning she woke up with nasal congestion and ear pressure.  Patient reports a history of allergies, but reports that she does not take any medication over-the-counter for this condition.  Patient reports that she also wakes up with congestion and ear fullness, but declines a COVID-19 test.  History obtained from patient.  Patient's problem list, past medical and social history, medications, and allergies were reviewed by me and updated in Epic.    ROS  See HPI.  Objective   Vitals:   01/03/21 0813  BP: (!) 95/49  Pulse: 80  Resp: 18  Temp: 98.1 F (36.7 C)  SpO2: 98%    Vital signs and nursing note reviewed.  General: Appears well-developed and well-nourished. No acute distress.  HEENT Head: Normocephalic and atraumatic.  Facial muscles are symmetric.  Eyes: Conjunctivae and EOM are normal. No eye drainage or scleral icterus bilaterally. PERRLA, EOMI.  Ears: Hearing grossly intact, no drainage or visible deformity.  Effusion noted beneath bilateral TM with left TM mildly bulging.  No erythema to TMs bilaterally. Nose: No nasal deviation or rhinorrhea.  Mouth/Throat: No stridor or tracheal deviation.  Nonerythematous posterior pharynx noted without white patchy exudate appreciated. Neck: Normal range of motion, neck is supple.  Cardiovascular: Normal rate. Regular rhythm; no murmurs, gallops, or rubs.  Pulm/Chest: No respiratory distress. Breath sounds normal bilaterally without wheezes, rhonchi, or  rales.  Musculoskeletal: No joint deformity, normal range of motion.  Neuro: A&O x4. CN II - XII intact. 5/5 strength and normal sensation in all extremities. Facial muscles are symmetric. Gait is normal. Speech is fluent. Skin: Skin is warm and dry.  Psychiatric: Normal mood, affect, behavior, and thought content.   Data  No results found for any visits on 01/03/21.    Assessment & Plan  1. Vertigo - meclizine (ANTIVERT) 25 MG tablet; Take 1 tablet (25 mg total) by mouth 3 (three) times daily as needed for dizziness.  Dispense: 30 tablet; Refill: 0  2. Dysfunction of Eustachian tube, bilateral - azelastine (ASTELIN) 0.1 % nasal spray; Place 2 sprays into both nostrils 2 (two) times daily for 7 days. Use in each nostril as directed  Dispense: 30 mL; Refill: 0  3. Nasal congestion - azelastine (ASTELIN) 0.1 % nasal spray; Place 2 sprays into both nostrils 2 (two) times daily for 7 days. Use in each nostril as directed  Dispense: 30 mL; Refill: 0  30 y.o. female presents with recurrent vertigo.  Patient reports having several episodes over the last year. Was recently seen in the ED and was given medication to manage symptoms, but reports that she does not have enough of this medication to last until her doctor's appointment.  Patient reports that she also wakes up with congestion and ear fullness, but declines a COVID-19 test.  Given symptoms along with assessment findings, likely vertigo.  The patient also has an effusion noted to bilateral TMs which is suggestive of eustachian tube dysfunction bilaterally and nasal congestion.  Rx'd Astelin  nasal spray to the patient's preferred pharmacy along with meclizine.  Advised that she may use nondrowsy Dramamine in lieu of meclizine as she does report that the meclizine makes her quite tired throughout the day and makes it difficult to perform her job functions.  Advised to increase fluid intake, daily Zyrtec, Mucinex.  Advised that if she begins to have  any worsening of symptoms to include vomiting, worsening dizziness, fever, chills, severe facial pain or pressure please return for reevaluation.  Patient verbalized understanding and agreed with plan.  Patient stable upon discharge.  Plan:   Discharge Instructions      You may use nondrowsy Dramamine in the lieu of meclizine during the day when you are having episodes of vertigo.  This medication is not as sedating as the meclizine.  Increase fluid intake.  Begin using a daily Zyrtec tablet to help with your allergies.  Begin taking Mucinex as directed.  If you begin to have any worsening of symptoms to include vomiting, worsening dizziness, fever, chills, severe facial pain/pressure please return for reevaluation.         Amalia Greenhouse, [place] 01/03/21 [phone]

## 2021-01-15 ENCOUNTER — Encounter: Payer: Self-pay | Admitting: Emergency Medicine

## 2021-01-15 ENCOUNTER — Ambulatory Visit
Admission: EM | Admit: 2021-01-15 | Discharge: 2021-01-15 | Disposition: A | Discharge status: Home / Self Care | Payer: Medicaid Other

## 2021-01-15 DIAGNOSIS — J069 Acute upper respiratory infection, unspecified: Secondary | ICD-10-CM

## 2021-01-15 DIAGNOSIS — H6982 Other specified disorders of Eustachian tube, left ear: Secondary | ICD-10-CM | POA: Diagnosis not present

## 2021-01-15 NOTE — Discharge Instructions (Addendum)
Take ibuprofen and plain Mucinex as directed.  Use Flonase nasal spray as directed.  Follow-up with your primary care provider or an ENT as directed.

## 2021-01-15 NOTE — ED Triage Notes (Signed)
Pt c/o sinus pressure/pain, and bilateral ear pain that started today.

## 2021-01-15 NOTE — ED Provider Notes (Signed)
[name]    CSN: [id number] Arrival date & time: 01/15/21  1546      History   Chief Complaint Chief Complaint  Patient presents with   Sinus Issues     HPI FRIMET DURFEE is a 30 y.o. female.  Patient presents with  left ear pain, sinus congestion, sinus pressure since this morning.  She denies fever, chills, sore throat, cough, shortness of breath, or other symptoms.  No OTC medications taken today.  Her medical history includes asthma, seasonal allergies, chronic back pain.  The history is provided by the patient and medical records.   Past Medical History:  Diagnosis Date   Abnormal Pap smear    Allergy    Anxiety    Asthma    Headache(784.0)    PONV (postoperative nausea and vomiting)     Patient Active Problem List   Diagnosis Date Noted   History of asthma 02/14/2020   Family history of Sjogren's disease 02/14/2020   History of anxiety 02/14/2020   Term pregnancy 10/12/2019   Pregnancy 09/22/2017   H. pylori infection 08/18/2012   Chronic back pain 08/18/2012    Past Surgical History:  Procedure Laterality Date   CHOLECYSTECTOMY N/A 10/09/2012   Procedure: LAPAROSCOPIC CHOLECYSTECTOMY WITH INTRAOPERATIVE CHOLANGIOGRAM;  Surgeon: Kandis Cocking, MD;  Location: [facility] OR;  Service: General;  Laterality: N/A;   COLPOSCOPY     TONSILLECTOMY     WISDOM TOOTH EXTRACTION      OB History     Gravida  4   Para  3   Term  3   Preterm  0   AB  1   Living  3      SAB  1   IAB  0   Ectopic  0   Multiple  0   Live Births  3            Home Medications    Prior to Admission medications   Medication Sig Start Date End Date Taking? Authorizing Provider  albuterol (VENTOLIN HFA) 108 (90 Base) MCG/ACT inhaler Inhale 2 puffs into the lungs every 4 (four) hours as needed for wheezing or shortness of breath. 05/06/19  Yes Leftwich-Kirby, Wilmer Floor, CNM  fluticasone (FLONASE) 50 MCG/ACT nasal spray Place 1 spray into both nostrils daily.  06/29/20  Yes Jacalyn Lefevre, MD  meclizine (ANTIVERT) 25 MG tablet Take 1 tablet (25 mg total) by mouth 3 (three) times daily as needed for dizziness. 01/03/21  Yes Boddu, Belenda Cruise, FNP  norethindrone (MICRONOR) 0.35 MG tablet Take 0.35 mg by mouth daily.   Yes [provider]  azelastine (ASTELIN) 0.1 % nasal spray Place 2 sprays into both nostrils 2 (two) times daily for 7 days. Use in each nostril as directed 01/03/21 01/15/21  Boddu, Belenda Cruise, FNP  doxycycline (VIBRAMYCIN) 100 MG capsule Take 1 capsule (100 mg total) by mouth 2 (two) times daily. 10/31/20   Cathie Hoops, Amy V, PA-C  fluconazole (DIFLUCAN) 150 MG tablet Take 1 tablet (150 mg total) by mouth daily. Take second dose 72 hours later if symptoms still persists. 10/31/20   Cathie Hoops, Amy V, PA-C  oxymetazoline (AFRIN NASAL SPRAY) 0.05 % nasal spray Place 1 spray into both nostrils 2 (two) times daily. Use for 3 days only 06/29/20   Jacalyn Lefevre, MD  predniSONE (DELTASONE) 50 MG tablet Take 1 tablet (50 mg total) by mouth daily with breakfast. 10/31/20   Belinda Fisher, PA-C    Family History Family  History  Problem Relation Age of Onset   Heart disease Maternal Grandfather    Diabetes Maternal Grandfather    Anxiety disorder Maternal Grandfather    Hypertension Mother    Heart disease Mother        PVCs   Anemia Mother    Thyroid disease Mother    Migraines Mother    Lupus Mother    Rheum arthritis Mother    Fibromyalgia Mother    Hypertension Father    Heart disease Father    Diabetes Maternal Aunt    Kidney disease Maternal Aunt    Thyroid disease Maternal Grandmother    Cancer Paternal Grandmother        lymphoma   Cancer Paternal Grandfather        prostate   Healthy Brother    Asthma Daughter    Healthy Daughter    Healthy Daughter    Healthy Daughter    Anesthesia problems Neg Hx    Hypotension Neg Hx    Malignant hyperthermia Neg Hx    Pseudochol deficiency Neg Hx     Social History Social History   Tobacco Use    Smoking status: Never   Smokeless tobacco: Never  Vaping Use   Vaping Use: Never used  Substance Use Topics   Alcohol use: Yes    Comment: rarely   Drug use: No     Allergies   Patient has no known allergies.   Review of Systems Review of Systems  Constitutional:  Negative for chills and fever.  HENT:  Positive for congestion, ear pain, rhinorrhea and sinus pressure. Negative for sore throat.   Respiratory:  Negative for cough and shortness of breath.   Cardiovascular:  Negative for chest pain and palpitations.  Gastrointestinal:  Negative for abdominal pain, diarrhea and vomiting.  Skin:  Negative for color change and rash.  All other systems reviewed and are negative.   Physical Exam Triage Vital Signs ED Triage Vitals  Enc Vitals Group     BP 01/15/21 1608 120/78     Pulse Rate 01/15/21 1608 94     Resp 01/15/21 1608 18     Temp 01/15/21 1608 98.1 F (36.7 C)     Temp Source 01/15/21 1608 Oral     SpO2 01/15/21 1608 98 %     Weight --      Height --      Head Circumference --      Peak Flow --      Pain Score 01/15/21 1618 6     Pain Loc --      Pain Edu? --      Excl. in GC? --    No data found.  Updated Vital Signs BP 120/78 (BP Location: Left Arm)   Pulse 94   Temp 98.1 F (36.7 C) (Oral)   Resp 18   LMP 01/03/2021 (Approximate)   SpO2 98%   Visual Acuity Right Eye Distance:   Left Eye Distance:   Bilateral Distance:    Right Eye Near:   Left Eye Near:    Bilateral Near:     Physical Exam Vitals and nursing note reviewed.  Constitutional:      General: She is not in acute distress.    Appearance: She is well-developed.  HENT:     Head: Normocephalic and atraumatic.     Right Ear: Tympanic membrane normal.     Left Ear: A middle ear effusion is present.     Nose:  Congestion and rhinorrhea present.     Mouth/Throat:     Mouth: Mucous membranes are moist.     Pharynx: Oropharynx is clear.  Eyes:     Conjunctiva/sclera: Conjunctivae  normal.  Cardiovascular:     Rate and Rhythm: Normal rate and regular rhythm.     Heart sounds: Normal heart sounds.  Pulmonary:     Effort: Pulmonary effort is normal. No respiratory distress.     Breath sounds: Normal breath sounds.  Abdominal:     Palpations: Abdomen is soft.     Tenderness: There is no abdominal tenderness.  Musculoskeletal:     Cervical back: Neck supple.  Skin:    General: Skin is warm and dry.  Neurological:     General: No focal deficit present.     Mental Status: She is alert and oriented to person, place, and time.     Gait: Gait normal.  Psychiatric:        Mood and Affect: Mood normal.        Behavior: Behavior normal.     UC Treatments / Results  Labs (all labs ordered are listed, but only abnormal results are displayed) Labs Reviewed - No data to display  EKG   Radiology No results found.  Procedures Procedures (including critical care time)  Medications Ordered in UC Medications - No data to display  Initial Impression / Assessment and Plan / UC Course  I have reviewed the triage vital signs and the nursing notes.  Pertinent labs & imaging results that were available during my care of the patient were reviewed by me and considered in my medical decision making (see chart for details).  Left eustachian tube dysfunction, viral URI.  Discussed symptomatic treatment including ibuprofen, plain Mucinex, Flonase nasal spray.  Patient declines COVID test today.  Instructed her to follow-up with her PCP or an ENT if her symptoms are not improving.  She agrees to plan of care.   Final Clinical Impressions(s) / UC Diagnoses   Final diagnoses:  Acute dysfunction of left eustachian tube  Viral URI     Discharge Instructions      Take ibuprofen and plain Mucinex as directed.  Use Flonase nasal spray as directed.  Follow-up with your primary care provider or an ENT as directed.     ED Prescriptions   None    PDMP not reviewed this  encounter.   Mickie Bail, NP 01/15/21 [phone]

## 2021-04-01 ENCOUNTER — Other Ambulatory Visit: Payer: Self-pay

## 2021-04-01 ENCOUNTER — Ambulatory Visit
Admission: EM | Admit: 2021-04-01 | Discharge: 2021-04-01 | Disposition: A | Discharge status: Home / Self Care | Payer: BC Managed Care – PPO

## 2021-04-01 NOTE — ED Triage Notes (Signed)
Pt presents with c/o body aches, cough and runny nose that began Friday, negative covid test

## 2021-05-31 ENCOUNTER — Emergency Department (HOSPITAL_BASED_OUTPATIENT_CLINIC_OR_DEPARTMENT_OTHER)
Admission: EM | Admit: 2021-05-31 | Discharge: 2021-05-31 | Disposition: A | Discharge status: Home / Self Care | Payer: BC Managed Care – PPO | Attending: Emergency Medicine | Admitting: Emergency Medicine

## 2021-05-31 ENCOUNTER — Encounter (HOSPITAL_BASED_OUTPATIENT_CLINIC_OR_DEPARTMENT_OTHER): Payer: Self-pay

## 2021-05-31 ENCOUNTER — Other Ambulatory Visit: Payer: Self-pay

## 2021-05-31 DIAGNOSIS — D72829 Elevated white blood cell count, unspecified: Secondary | ICD-10-CM | POA: Insufficient documentation

## 2021-05-31 DIAGNOSIS — R1013 Epigastric pain: Secondary | ICD-10-CM | POA: Diagnosis present

## 2021-05-31 DIAGNOSIS — K2900 Acute gastritis without bleeding: Secondary | ICD-10-CM | POA: Diagnosis not present

## 2021-05-31 LAB — COMPREHENSIVE METABOLIC PANEL
ALT: 16 U/L (ref 0–44)
AST: 15 U/L (ref 15–41)
Albumin: 4.7 g/dL (ref 3.5–5.0)
Alkaline Phosphatase: 89 U/L (ref 38–126)
Anion gap: 9 (ref 5–15)
BUN: 13 mg/dL (ref 6–20)
CO2: 26 mmol/L (ref 22–32)
Calcium: 9.7 mg/dL (ref 8.9–10.3)
Chloride: 101 mmol/L (ref 98–111)
Creatinine, Ser: 0.59 mg/dL (ref 0.44–1.00)
GFR, Estimated: >60 mL/min (ref >60)
Glucose, Bld: 91 mg/dL (ref 70–99)
Potassium: 3.8 mmol/L (ref 3.5–5.1)
Sodium: 136 mmol/L (ref 135–145)
Total Bilirubin: 0.4 mg/dL (ref 0.3–1.2)
Total Protein: 9.1 g/dL — ABNORMAL HIGH (ref 6.5–8.1)

## 2021-05-31 LAB — CBC
HCT: 39.5 % (ref 36.0–46.0)
Hemoglobin: 12.9 g/dL (ref 12.0–15.0)
MCH: 29.2 pg (ref 26.0–34.0)
MCHC: 32.7 g/dL (ref 30.0–36.0)
MCV: 89.4 fL (ref 80.0–100.0)
Platelets: 305 10*3/uL (ref 150–400)
RBC: 4.42 MIL/uL (ref 3.87–5.11)
RDW: 12.5 % (ref 11.5–15.5)
WBC: 13.5 10*3/uL — ABNORMAL HIGH (ref 4.0–10.5)
nRBC: 0 % (ref 0.0–0.2)

## 2021-05-31 LAB — URINALYSIS, ROUTINE W REFLEX MICROSCOPIC
Bilirubin Urine: NEGATIVE
Glucose, UA: NEGATIVE mg/dL
Hgb urine dipstick: NEGATIVE
Ketones, ur: NEGATIVE mg/dL
Nitrite: NEGATIVE
Protein, ur: 30 mg/dL — AB
Specific Gravity, Urine: 1.028 (ref 1.005–1.030)
pH: 7 (ref 5.0–8.0)

## 2021-05-31 LAB — PREGNANCY, URINE: Preg Test, Ur: NEGATIVE

## 2021-05-31 LAB — LIPASE, BLOOD: Lipase: 45 U/L (ref 11–51)

## 2021-05-31 NOTE — ED Provider Notes (Signed)
?[facility] ?Provider Note ? ? ?CSN: [id number] ?Arrival date & time: 05/31/21  1946 ? ?  ? ?History ? ?Chief Complaint  ?Patient presents with  ? Abdominal Pain  ? ? ?Kristen Davidson is a 31 y.o. female. ? ?Patient is a 31 year old female with a history of prior cholecystectomy who is presenting today with complaint of upper abdominal soreness that has been present over the last week.  She reports it just feels bruised.  She does notice it a little more when she takes a deep breath but cannot tell if it is worse after eating.  It is been fairly persistent and she does notice it even when she is lying down.  She initially thought it was just gas but is not gone away and she started to become more concerned about it.  She has had no nausea vomiting or change in her stools.  She denies any fever.  She has not had significant heartburn-like symptoms the pain is not moved up into her chest and she denies any cough or shortness of breath.  She does not use excessive NSAIDs and takes no blood thinners.  She is not currently on any medications but does report last week she had strep throat in which she was treated with antibiotics prior to her symptoms starting.  She denies any vaginal discharge, burning itching or urinary symptoms at this time. ? ?The history is provided by the patient.  ?Abdominal Pain ? ?  ? ?Home Medications ?Prior to Admission medications   ?Medication Sig Start Date End Date Taking? Authorizing Provider  ?albuterol (VENTOLIN HFA) 108 (90 Base) MCG/ACT inhaler Inhale 2 puffs into the lungs every 4 (four) hours as needed for wheezing or shortness of breath. 05/06/19   Leftwich-Kirby, Wilmer Floor, CNM  ?azelastine (ASTELIN) 0.1 % nasal spray Place 2 sprays into both nostrils 2 (two) times daily for 7 days. Use in each nostril as directed 01/03/21 01/15/21  Amalia Greenhouse, FNP  ?doxycycline (VIBRAMYCIN) 100 MG capsule Take 1 capsule (100 mg total) by mouth 2 (two) times daily. 10/31/20    Cathie Hoops, Amy V, PA-C  ?fluconazole (DIFLUCAN) 150 MG tablet Take 1 tablet (150 mg total) by mouth daily. Take second dose 72 hours later if symptoms still persists. 10/31/20   Cathie Hoops, Amy V, PA-C  ?fluticasone (FLONASE) 50 MCG/ACT nasal spray Place 1 spray into both nostrils daily. 06/29/20   Jacalyn Lefevre, MD  ?meclizine (ANTIVERT) 25 MG tablet Take 1 tablet (25 mg total) by mouth 3 (three) times daily as needed for dizziness. 01/03/21   Boddu, Belenda Cruise, FNP  ?norethindrone (MICRONOR) 0.35 MG tablet Take 0.35 mg by mouth daily.    [provider]  ?oxymetazoline (AFRIN NASAL SPRAY) 0.05 % nasal spray Place 1 spray into both nostrils 2 (two) times daily. Use for 3 days only 06/29/20   Jacalyn Lefevre, MD  ?predniSONE (DELTASONE) 50 MG tablet Take 1 tablet (50 mg total) by mouth daily with breakfast. 10/31/20   Belinda Fisher, PA-C  ?   ? ?Allergies    ?Patient has no known allergies.   ? ?Review of Systems   ?Review of Systems  ?Gastrointestinal:  Positive for abdominal pain.  ? ?Physical Exam ?Updated Vital Signs ?BP 115/78 (BP Location: Right Arm)   Pulse 77   Temp 97.8 ?F (36.6 ?C)   Resp 16   Ht 5\' 7"  (1.702 m)   Wt 98.9 kg   SpO2 99%   BMI 34.14 kg/m?  ?Physical  Exam ?Vitals and nursing note reviewed.  ?Constitutional:   ?   General: She is not in acute distress. ?   Appearance: She is well-developed.  ?HENT:  ?   Head: Normocephalic and atraumatic.  ?Eyes:  ?   Pupils: Pupils are equal, round, and reactive to light.  ?Cardiovascular:  ?   Rate and Rhythm: Normal rate and regular rhythm.  ?   Heart sounds: Normal heart sounds. No murmur heard. ?  No friction rub.  ?Pulmonary:  ?   Effort: Pulmonary effort is normal.  ?   Breath sounds: Normal breath sounds. No wheezing or rales.  ?Abdominal:  ?   General: Bowel sounds are normal. There is no distension.  ?   Palpations: Abdomen is soft.  ?   Tenderness: There is abdominal tenderness in the epigastric area and left upper quadrant. There is no guarding or rebound.   ?Musculoskeletal:     ?   General: No tenderness. Normal range of motion.  ?   Comments: No edema  ?Skin: ?   General: Skin is warm and dry.  ?   Findings: No rash.  ?Neurological:  ?   Mental Status: She is alert and oriented to person, place, and time.  ?   Cranial Nerves: No cranial nerve deficit.  ?Psychiatric:     ?   Behavior: Behavior normal.  ? ? ?ED Results / Procedures / Treatments   ?Labs ?(all labs ordered are listed, but only abnormal results are displayed) ?Labs Reviewed  ?COMPREHENSIVE METABOLIC PANEL - Abnormal; Notable for the following components:  ?    Result Value  ? Total Protein 9.1 (*)   ? All other components within normal limits  ?CBC - Abnormal; Notable for the following components:  ? WBC 13.5 (*)   ? All other components within normal limits  ?URINALYSIS, ROUTINE W REFLEX MICROSCOPIC - Abnormal; Notable for the following components:  ? Protein, ur 30 (*)   ? Leukocytes,Ua MODERATE (*)   ? Bacteria, UA RARE (*)   ? All other components within normal limits  ?LIPASE, BLOOD  ?PREGNANCY, URINE  ? ? ?EKG ?None ? ?Radiology ?No results found. ? ?Procedures ?Procedures  ? ? ?Medications Ordered in ED ?Medications - No data to display ? ?ED Course/ Medical Decision Making/ A&P ?  ?                        ?Medical Decision Making ?Amount and/or Complexity of Data Reviewed ?External Data Reviewed: notes. ?Labs: ordered. Decision-making details documented in ED Course. ? ?Risk ?OTC drugs. ? ? ?Patient is a 31 year old female presenting today with upper abdominal pain.  Suspect this is gastritis or possible early PUD most likely from recent antibiotic use.  She has no symptoms concerning for appendicitis, diverticulitis or perforated viscus.  She is status postcholecystectomy and low suspicion for bile duct obstruction at this time.  I independently interpreted patient's labs and her CBC, CMP and lipase are all within normal limits today except for a mild leukocytosis of 13,000 of unknown  significance.  Patient's urine does have moderate leukocytes but only 6-10 white blood cells and rare bacteria and low suspicion for UTI at this time especially because patient is asymptomatic and has no lower abdominal symptoms.  Discussed the findings with the patient.  Discussed at that time that she did not require a CT given the above findings.  We will start a PPI, dietary changes and NSAID avoidance.  Patient was given return precautions.  She does have a PCP she can follow-up with if symptoms or not improving as she would need to see GI.  No indication for admission at this time. ? ? ? ? ? ? ? ?Final Clinical Impression(s) / ED Diagnoses ?Final diagnoses:  ?Acute gastritis without hemorrhage, unspecified gastritis type  ? ? ?Rx / DC Orders ?ED Discharge Orders   ? ? None  ? ?  ? ? ?  ?Gwyneth Sprout, MD ?05/31/21 2207 ? ?

## 2021-05-31 NOTE — ED Triage Notes (Signed)
Patient here POV from Home with ABD Pain. ? ?Pain is Mid/Epigastric ABD. Began approximately 5 days ago and is Constant in [place]. Worsens with certain Movements.  ? ?No N/V/D. No Constipation. No Fevers. No Urinary Symptoms.  ? ?PMH of Gall Bladder Removal. A&Ox4. Gcs 15. Ambulatory. NAD Noted during Triage.  ?

## 2021-05-31 NOTE — Discharge Instructions (Addendum)
Get the prilosec OTC over the counter which is a 2 week course and try that for the pain.  You can also do tums for more immediate relief.  Avoid Ibuprofen products. ?If you start having severe pain, vomiting, fever or other symptoms return to the emergency room for recheck. ?

## 2021-06-21 ENCOUNTER — Ambulatory Visit: Payer: Medicaid Other | Admitting: Physician Assistant

## 2021-10-30 IMAGING — CT CT HEAD W/O CM
4 series · 17 of 47 positions shown, 19 images · non-contrast
Comparison: October 05, 2010

CLINICAL DATA: Headache and dizziness

EXAM:
CT HEAD WITHOUT CONTRAST
TECHNIQUE: Contiguous axial images were obtained from the base of the skull
through the vertex without intravenous contrast.

[Series 2: head wo · axial · 0.41mm/px · z∈[-168,-48]mm · 7 of 32 slices shown, 9 images]
[im 4/32  brain]
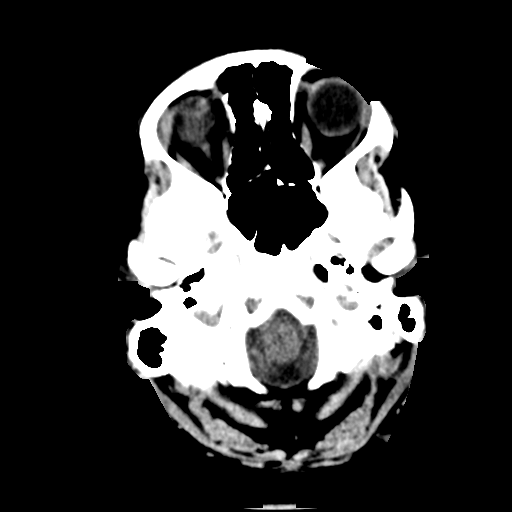
[im 4/32  bone]
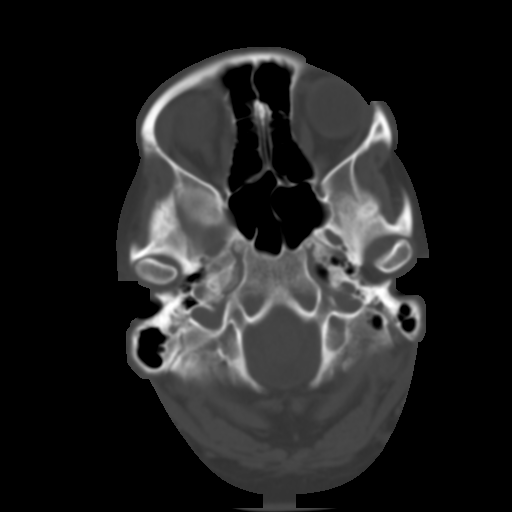
[im 8/32  brain]
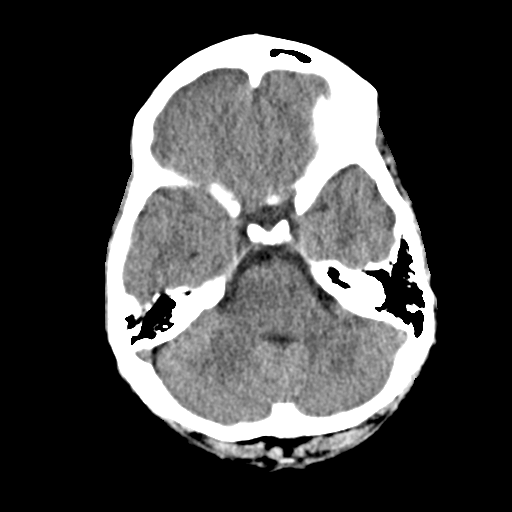
[im 12/32  brain]
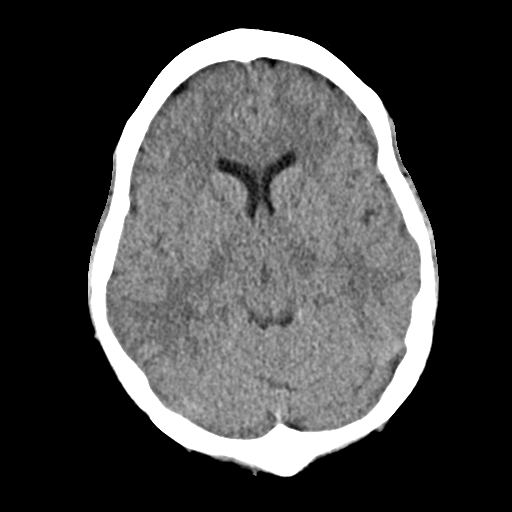
[im 16/32  brain]
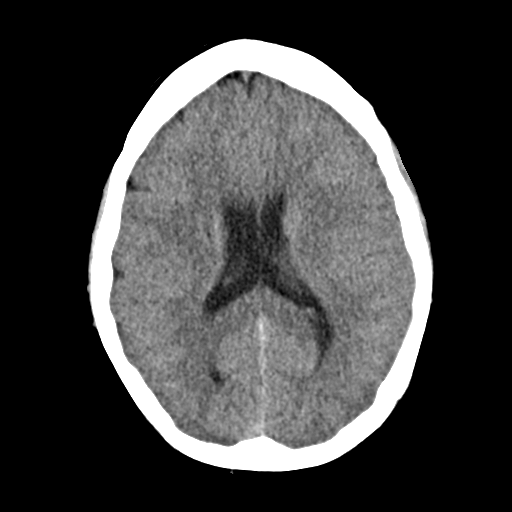
[im 20/32  brain]
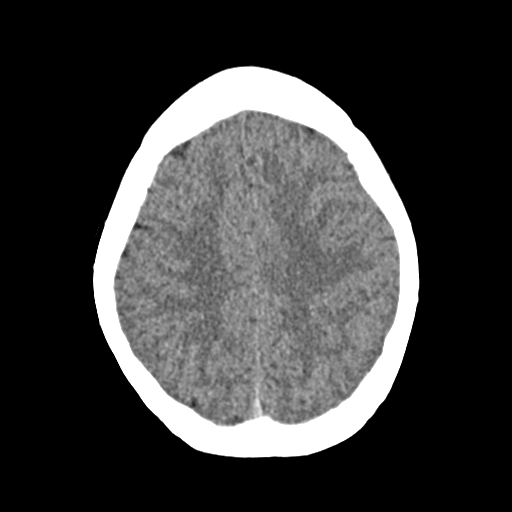
[im 20/32  bone]
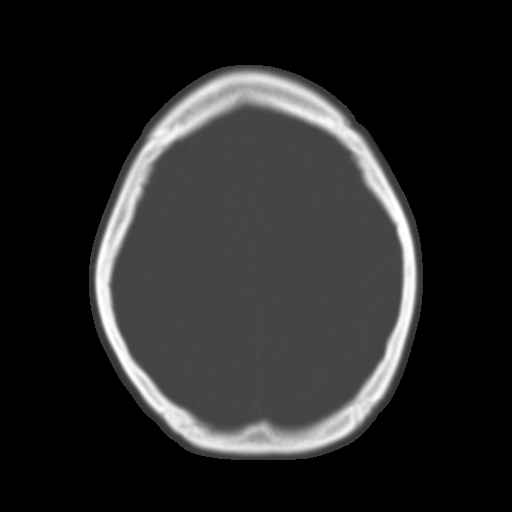
[im 24/32  brain]
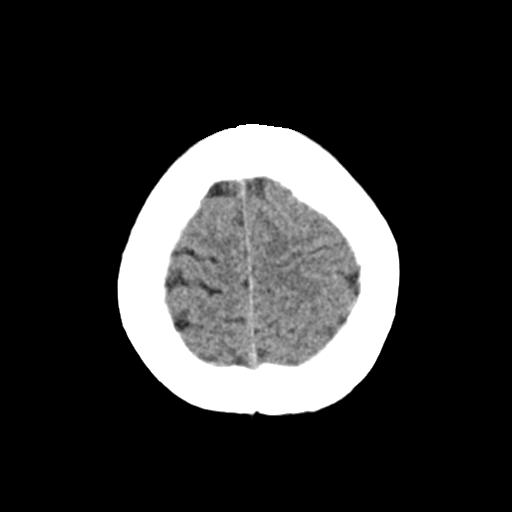
[im 28/32  brain]
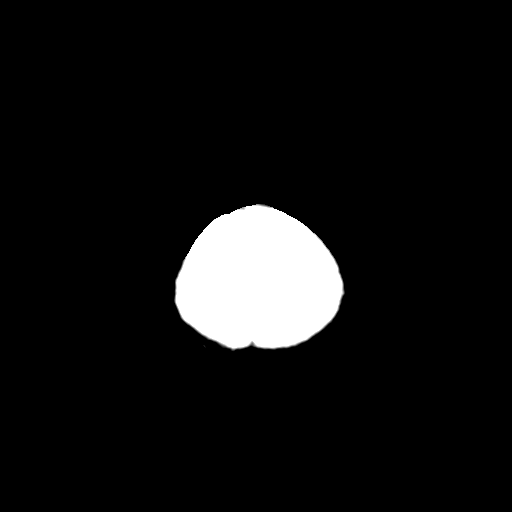

[Series 3: head bone · axial · 0.41mm/px · z∈[-169,-113]mm · 4 of 80 slices shown]
[im 8/80  bone]
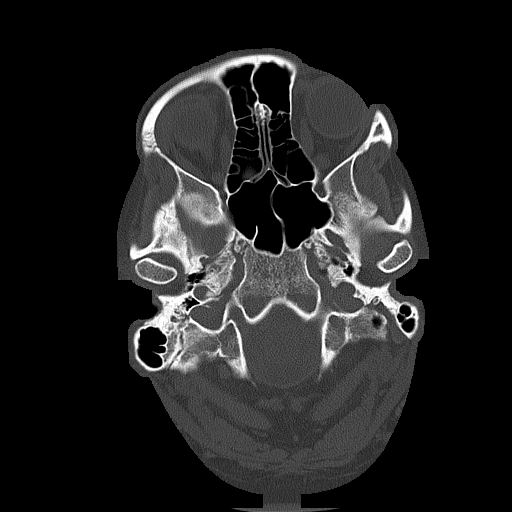
[im 16/80  bone]
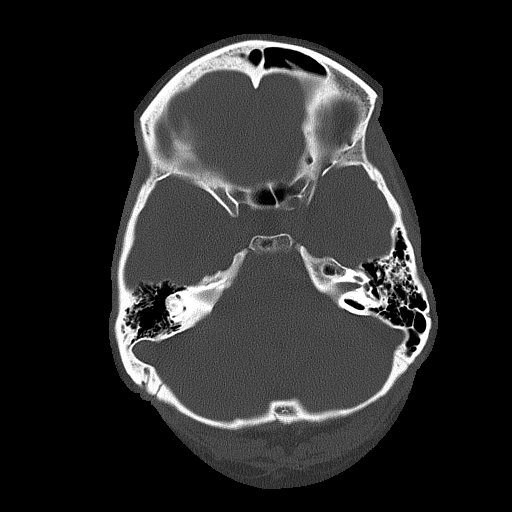
[im 24/80  bone]
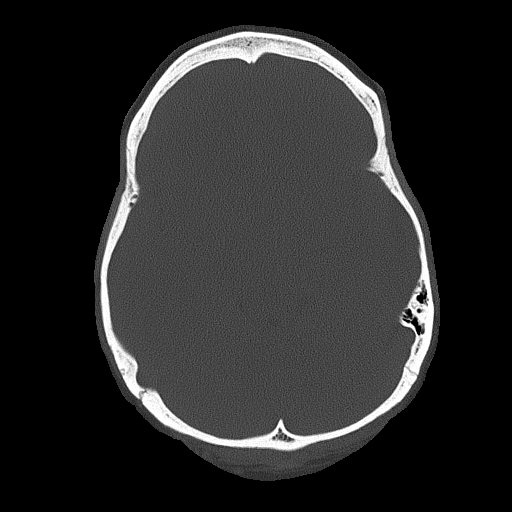
[im 36/80  bone]
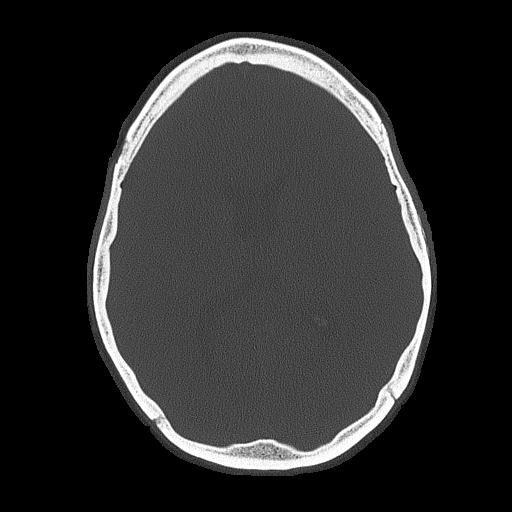

[Series 4: coronal soft · coronal · 0.32mm/px · 3 of 66 slices shown]
[im 22/66  brain]
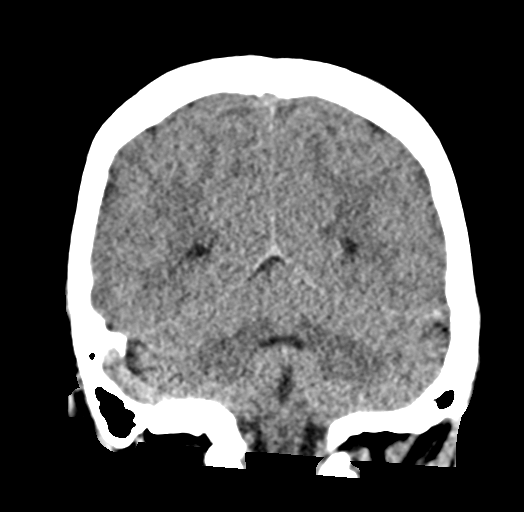
[im 29/66  brain]
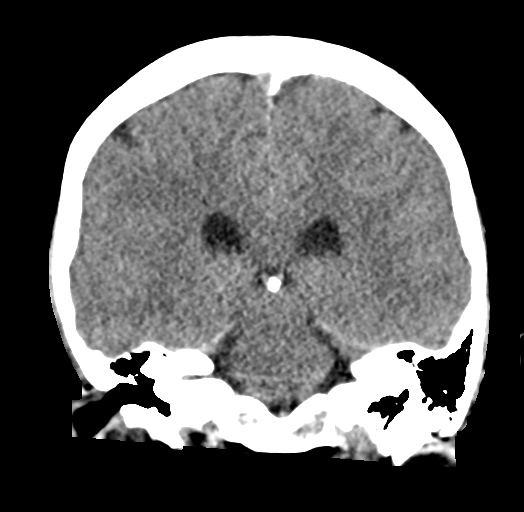
[im 37/66  brain]
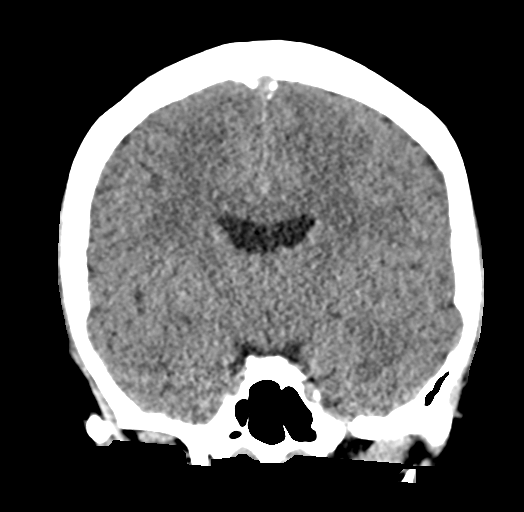

[Series 5: sag soft · sagittal · 0.32mm/px · 3 of 52 slices shown]
[im 18/52  brain]
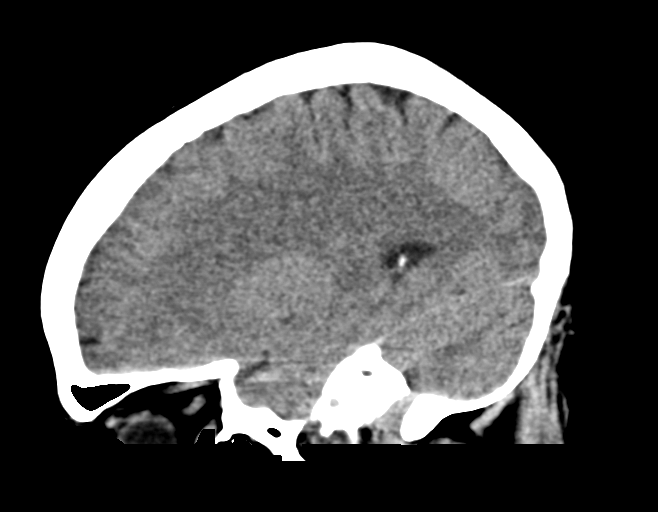
[im 26/52  brain]
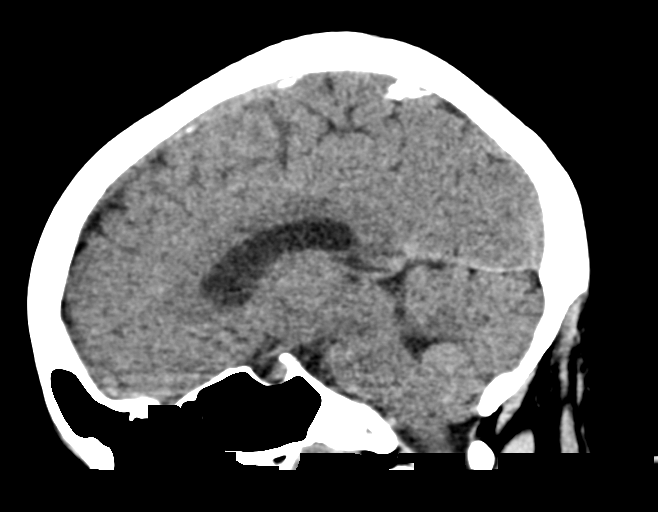
[im 35/52  brain]
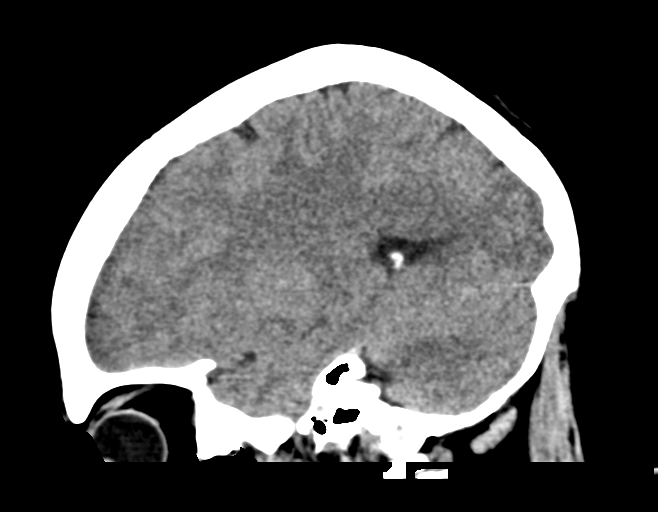

[17 of 47 positions shown; findings below may reference images not displayed]

FINDINGS: Brain: Ventricles and sulci are normal in size and configuration.
There is no intracranial mass, hemorrhage, extra-axial fluid
collection, or midline shift. Brain parenchyma appears unremarkable.
No evident acute infarct.

Vascular: No hyperdense vessel. No appreciable vascular
calcification.

Skull: Bony calvarium appears intact.

Sinuses/Orbits: There is opacification in an area of posterior
ethmoid air cells. Other visualized paranasal sinuses are clear.
Visualized orbits appear symmetric bilaterally.

Other: Mastoid air cells are clear.
IMPRESSION: Focal area of paranasal sinus disease in the posterior right ethmoid
air cell complex. Study otherwise unremarkable.

## 2022-02-09 ENCOUNTER — Other Ambulatory Visit: Payer: Self-pay

## 2022-02-09 DIAGNOSIS — N9489 Other specified conditions associated with female genital organs and menstrual cycle: Secondary | ICD-10-CM | POA: Diagnosis not present

## 2022-02-09 DIAGNOSIS — H53149 Visual discomfort, unspecified: Secondary | ICD-10-CM | POA: Insufficient documentation

## 2022-02-09 DIAGNOSIS — R519 Headache, unspecified: Secondary | ICD-10-CM | POA: Diagnosis present

## 2022-02-09 DIAGNOSIS — Z7951 Long term (current) use of inhaled steroids: Secondary | ICD-10-CM | POA: Diagnosis not present

## 2022-02-09 DIAGNOSIS — R110 Nausea: Secondary | ICD-10-CM | POA: Insufficient documentation

## 2022-02-09 DIAGNOSIS — J45909 Unspecified asthma, uncomplicated: Secondary | ICD-10-CM | POA: Diagnosis not present

## 2022-02-09 LAB — COMPREHENSIVE METABOLIC PANEL
ALT: 25 U/L (ref 0–44)
AST: 20 U/L (ref 15–41)
Albumin: 4.8 g/dL (ref 3.5–5.0)
Alkaline Phosphatase: 93 U/L (ref 38–126)
Anion gap: 9 (ref 5–15)
BUN: 17 mg/dL (ref 6–20)
CO2: 27 mmol/L (ref 22–32)
Calcium: 9.5 mg/dL (ref 8.9–10.3)
Chloride: 101 mmol/L (ref 98–111)
Creatinine, Ser: 0.64 mg/dL (ref 0.44–1.00)
GFR, Estimated: >60 mL/min (ref >60)
Glucose, Bld: 106 mg/dL — ABNORMAL HIGH (ref 70–99)
Potassium: 3.5 mmol/L (ref 3.5–5.1)
Sodium: 137 mmol/L (ref 135–145)
Total Bilirubin: 0.4 mg/dL (ref 0.3–1.2)
Total Protein: 8.7 g/dL — ABNORMAL HIGH (ref 6.5–8.1)

## 2022-02-09 LAB — CBC
HCT: 37.2 % (ref 36.0–46.0)
Hemoglobin: 12.4 g/dL (ref 12.0–15.0)
MCH: 29.7 pg (ref 26.0–34.0)
MCHC: 33.3 g/dL (ref 30.0–36.0)
MCV: 89.2 fL (ref 80.0–100.0)
Platelets: 257 10*3/uL (ref 150–400)
RBC: 4.17 MIL/uL (ref 3.87–5.11)
RDW: 12.3 % (ref 11.5–15.5)
WBC: 7.9 10*3/uL (ref 4.0–10.5)
nRBC: 0 % (ref 0.0–0.2)

## 2022-02-09 MED ORDER — ONDANSETRON HCL 4 MG/2ML IJ SOLN
4.0000 mg | Freq: Once | INTRAMUSCULAR | Status: AC | PRN
Start: 2022-02-09 — End: 2022-02-09
  Administered 2022-02-09: 4 mg via INTRAVENOUS

## 2022-02-09 NOTE — ED Triage Notes (Signed)
Pt c/o pounding headache that started around 3 pm. Pain around frontal, bilateral temporal, sinus, and neck. Pt endorses chronic congestion with ENT follow up in December. Pain associated with nausea and photosensitivity.    PO tylenol around 3 pm and ibuprofen 1 hr ago without relief. No known HX of migraines.

## 2022-02-10 ENCOUNTER — Emergency Department (HOSPITAL_BASED_OUTPATIENT_CLINIC_OR_DEPARTMENT_OTHER)
Admission: EM | Admit: 2022-02-10 | Discharge: 2022-02-10 | Disposition: A | Discharge status: Home / Self Care | Payer: BC Managed Care – PPO | Attending: Emergency Medicine | Admitting: Emergency Medicine

## 2022-02-10 DIAGNOSIS — R519 Headache, unspecified: Secondary | ICD-10-CM | POA: Diagnosis not present

## 2022-02-10 LAB — HCG, SERUM, QUALITATIVE: Preg, Serum: NEGATIVE

## 2022-02-10 MED ORDER — PROMETHAZINE HCL 25 MG PO TABS: 25.0000 mg | ORAL_TABLET | Freq: Four times a day (QID) | ORAL | 0 refills | Status: DC | PRN

## 2022-02-10 MED ORDER — LIDOCAINE 5 % EX PTCH: 1.0000 | MEDICATED_PATCH | Freq: Every day | CUTANEOUS | 0 refills | Status: DC | PRN

## 2022-02-10 MED ORDER — FENTANYL CITRATE PF 50 MCG/ML IJ SOSY
50.0000 ug | PREFILLED_SYRINGE | Freq: Once | INTRAMUSCULAR | Status: DC
  Filled 2022-02-10: qty 1

## 2022-02-10 MED ORDER — HYDROMORPHONE HCL 1 MG/ML IJ SOLN: 1.0000 mg | Freq: Once | INTRAMUSCULAR | Status: DC

## 2022-02-10 MED ORDER — SODIUM CHLORIDE 0.9 % IV BOLUS
1000.0000 mL | Freq: Once | INTRAVENOUS | Status: AC
  Administered 2022-02-10: 1000 mL via INTRAVENOUS

## 2022-02-10 MED ORDER — DIPHENHYDRAMINE HCL 50 MG/ML IJ SOLN
25.0000 mg | Freq: Once | INTRAMUSCULAR | Status: AC
  Administered 2022-02-10: 25 mg via INTRAVENOUS
  Filled 2022-02-10: qty 1

## 2022-02-10 MED ORDER — KETOROLAC TROMETHAMINE 15 MG/ML IJ SOLN
15.0000 mg | Freq: Once | INTRAMUSCULAR | Status: AC
Start: 2022-02-10 — End: 2022-02-10
  Administered 2022-02-10: 15 mg via INTRAVENOUS
  Filled 2022-02-10: qty 1

## 2022-02-10 MED ORDER — LIDOCAINE 5 % EX PTCH
1.0000 | MEDICATED_PATCH | CUTANEOUS | Status: DC
  Administered 2022-02-10: 1 via TRANSDERMAL
  Filled 2022-02-10: qty 1

## 2022-02-10 MED ORDER — KETOROLAC TROMETHAMINE 15 MG/ML IJ SOLN
15.0000 mg | Freq: Once | INTRAMUSCULAR | Status: AC
  Administered 2022-02-10: 15 mg via INTRAVENOUS
  Filled 2022-02-10: qty 1

## 2022-02-10 MED ORDER — METOCLOPRAMIDE HCL 5 MG/ML IJ SOLN
5.0000 mg | Freq: Once | INTRAMUSCULAR | Status: AC
  Administered 2022-02-10: 5 mg via INTRAVENOUS
  Filled 2022-02-10: qty 2

## 2022-02-10 MED ORDER — ACETAMINOPHEN 325 MG PO TABS: 650.0000 mg | ORAL_TABLET | Freq: Four times a day (QID) | ORAL | 0 refills | Status: AC | PRN

## 2022-02-10 MED ORDER — MAGNESIUM SULFATE 2 GM/50ML IV SOLN
2.0000 g | Freq: Once | INTRAVENOUS | Status: AC
  Administered 2022-02-10: 2 g via INTRAVENOUS

## 2022-02-10 MED ORDER — DEXAMETHASONE 4 MG PO TABS
8.0000 mg | ORAL_TABLET | Freq: Once | ORAL | Status: AC
  Administered 2022-02-10: 8 mg via ORAL
  Filled 2022-02-10: qty 2

## 2022-02-10 MED ORDER — IBUPROFEN 600 MG PO TABS: 600.0000 mg | ORAL_TABLET | Freq: Four times a day (QID) | ORAL | 0 refills | Status: DC | PRN

## 2022-02-10 MED ORDER — CETIRIZINE HCL 10 MG PO TABS: 10.0000 mg | ORAL_TABLET | Freq: Every day | ORAL | 0 refills | Status: DC

## 2022-02-10 NOTE — ED Provider Notes (Signed)
[facility] EMERGENCY DEPT Provider Note   CSN: [id number] Arrival date & time: 02/09/22  2209     History  Chief Complaint  Patient presents with   Headache    Kristen Davidson is a 31 y.o. female.  Patient as above with significant medical history as below, including asthma, anxiety, recurrent HA who presents to the ED with complaint of headache.  Patient reports headaches around 3 PM yesterday, described as bitemporal, pulsatile, throbbing, radiation to her neck.  Associated with light sensitivity, nausea without vomiting.  Similar headaches in the past.  Mother with history of migraine headaches.  Denies fevers or chills, no falls or head injuries.  No numbness or tingling.  No blurry vision or double vision.  No difficulty swallowing, no chest pain or abdominal pain.  No change to bowel or bladder function.  She tried Tylenol 650 mg and also ibuprofen 200 mg without much relief of her headache at home.  Came to the ED for further evaluation.     Past Medical History:  Diagnosis Date   Abnormal Pap smear    Allergy    Anxiety    Asthma    Headache(784.0)    PONV (postoperative nausea and vomiting)     Past Surgical History:  Procedure Laterality Date   CHOLECYSTECTOMY N/A 10/09/2012   Procedure: LAPAROSCOPIC CHOLECYSTECTOMY WITH INTRAOPERATIVE CHOLANGIOGRAM;  Surgeon: Kandis Cocking, MD;  Location: [facility] OR;  Service: General;  Laterality: N/A;   COLPOSCOPY     TONSILLECTOMY     WISDOM TOOTH EXTRACTION       The history is provided by the patient. No language interpreter was used.  Headache Chronicity:  Recurrent Similar to prior headaches: yes   Associated symptoms: nausea and photophobia   Associated symptoms: no abdominal pain, no back pain, no cough and no fever        Home Medications Prior to Admission medications   Medication Sig Start Date End Date Taking? Authorizing Provider  acetaminophen (TYLENOL) 325 MG tablet Take 2 tablets (650 mg  total) by mouth every 6 (six) hours as needed. 02/10/22  Yes Sloan Leiter, DO  cetirizine (ZYRTEC ALLERGY) 10 MG tablet Take 1 tablet (10 mg total) by mouth daily for 14 days. 02/10/22 02/24/22 Yes Tanda Rockers A, DO  ibuprofen (ADVIL) 600 MG tablet Take 1 tablet (600 mg total) by mouth every 6 (six) hours as needed. 02/10/22  Yes Tanda Rockers A, DO  lidocaine (LIDODERM) 5 % Place 1 patch onto the skin daily as needed. Remove & Discard patch within 12 hours or as directed by MD 02/10/22  Yes Sloan Leiter, DO  promethazine (PHENERGAN) 25 MG tablet Take 1 tablet (25 mg total) by mouth every 6 (six) hours as needed (headache). 02/10/22  Yes Tanda Rockers A, DO  albuterol (VENTOLIN HFA) 108 (90 Base) MCG/ACT inhaler Inhale 2 puffs into the lungs every 4 (four) hours as needed for wheezing or shortness of breath. 05/06/19   Leftwich-Kirby, Wilmer Floor, CNM  azelastine (ASTELIN) 0.1 % nasal spray Place 2 sprays into both nostrils 2 (two) times daily for 7 days. Use in each nostril as directed 01/03/21 01/15/21  Boddu, Belenda Cruise, FNP  doxycycline (VIBRAMYCIN) 100 MG capsule Take 1 capsule (100 mg total) by mouth 2 (two) times daily. 10/31/20   Cathie Hoops, Amy V, PA-C  fluconazole (DIFLUCAN) 150 MG tablet Take 1 tablet (150 mg total) by mouth daily. Take second dose 72 hours later if symptoms still persists. 10/31/20  Yu, Amy V, PA-C  fluticasone (FLONASE) 50 MCG/ACT nasal spray Place 1 spray into both nostrils daily. 06/29/20   Jacalyn LefevreHaviland, Julie, MD  meclizine (ANTIVERT) 25 MG tablet Take 1 tablet (25 mg total) by mouth 3 (three) times daily as needed for dizziness. 01/03/21   Boddu, Belenda CruiseKristin, FNP  norethindrone (MICRONOR) 0.35 MG tablet Take 0.35 mg by mouth daily.    [provider]  oxymetazoline (AFRIN NASAL SPRAY) 0.05 % nasal spray Place 1 spray into both nostrils 2 (two) times daily. Use for 3 days only 06/29/20   Jacalyn LefevreHaviland, Julie, MD  predniSONE (DELTASONE) 50 MG tablet Take 1 tablet (50 mg total) by mouth daily with  breakfast. 10/31/20   Belinda FisherYu, Amy V, PA-C      Allergies    Patient has no known allergies.    Review of Systems   Review of Systems  Constitutional:  Negative for activity change and fever.  HENT:  Negative for facial swelling and trouble swallowing.   Eyes:  Positive for photophobia. Negative for discharge and redness.  Respiratory:  Negative for cough and shortness of breath.   Cardiovascular:  Negative for chest pain and palpitations.  Gastrointestinal:  Positive for nausea. Negative for abdominal pain.  Genitourinary:  Negative for dysuria and flank pain.  Musculoskeletal:  Negative for back pain and gait problem.  Skin:  Negative for pallor and rash.  Neurological:  Positive for headaches. Negative for syncope.    Physical Exam Updated Vital Signs BP (!) 98/50   Pulse 88   Temp 99 F (37.2 C) (Oral)   Resp 18   SpO2 96%  Physical Exam Vitals and nursing note reviewed.  Constitutional:      General: She is awake. She is not in acute distress.    Appearance: Normal appearance. She is obese. She is not ill-appearing, toxic-appearing or diaphoretic.  HENT:     Head: Normocephalic and atraumatic. No right periorbital erythema or left periorbital erythema.     Jaw: There is normal jaw occlusion.     Right Ear: External ear normal.     Left Ear: External ear normal.     Nose: Nose normal.     Mouth/Throat:     Mouth: Mucous membranes are moist.  Eyes:     General: No scleral icterus.       Right eye: No discharge.        Left eye: No discharge.  Neck:     Trachea: Trachea normal.     Meningeal: Brudzinski's sign and Kernig's sign absent.  Cardiovascular:     Rate and Rhythm: Normal rate and regular rhythm.     Pulses: Normal pulses.     Heart sounds: Normal heart sounds.  Pulmonary:     Effort: Pulmonary effort is normal. No tachypnea, accessory muscle usage or respiratory distress.     Breath sounds: Normal breath sounds.  Abdominal:     General: Abdomen is flat.      Palpations: Abdomen is soft.     Tenderness: There is no abdominal tenderness. There is no guarding or rebound.  Musculoskeletal:        General: Normal range of motion.     Cervical back: Full passive range of motion without pain and normal range of motion. No rigidity.     Right lower leg: No edema.     Left lower leg: No edema.  Skin:    General: Skin is warm and dry.     Capillary Refill: Capillary  refill takes less than 2 seconds.  Neurological:     Mental Status: She is alert and oriented to person, place, and time.     GCS: GCS eye subscore is 4. GCS verbal subscore is 5. GCS motor subscore is 6.     Cranial Nerves: Cranial nerves 2-12 are intact. No dysarthria or facial asymmetry.     Sensory: Sensation is intact.     Motor: Motor function is intact. No weakness or pronator drift.     Coordination: Coordination is intact.     Gait: Gait is intact. Gait normal.     Comments: Strength 5/5 bilateral upper/ lower extremities  Psychiatric:        Mood and Affect: Mood normal.        Behavior: Behavior normal. Behavior is cooperative.     ED Results / Procedures / Treatments   Labs (all labs ordered are listed, but only abnormal results are displayed) Labs Reviewed  COMPREHENSIVE METABOLIC PANEL - Abnormal; Notable for the following components:      Result Value   Glucose, Bld 106 (*)    Total Protein 8.7 (*)    All other components within normal limits  CBC  HCG, SERUM, QUALITATIVE    EKG None  Radiology No results found.  Procedures Procedures    Medications Ordered in ED Medications  lidocaine (LIDODERM) 5 % 1 patch (1 patch Transdermal Patch Applied 02/10/22 0559)  fentaNYL (SUBLIMAZE) injection 50 mcg (50 mcg Intravenous Patient Refused/Not Given 02/10/22 0746)  ketorolac (TORADOL) 15 MG/ML injection 15 mg (has no administration in time range)  ondansetron (ZOFRAN) injection 4 mg (4 mg Intravenous Given 02/09/22 2228)  metoCLOPramide (REGLAN) injection 5  mg (5 mg Intravenous Given 02/10/22 0244)  diphenhydrAMINE (BENADRYL) injection 25 mg (25 mg Intravenous Given 02/10/22 0245)  sodium chloride 0.9 % bolus 1,000 mL (0 mLs Intravenous Stopped 02/10/22 0731)  ketorolac (TORADOL) 15 MG/ML injection 15 mg (15 mg Intravenous Given 02/10/22 0558)  magnesium sulfate IVPB 2 g 50 mL (0 g Intravenous Stopped 02/10/22 0614)  dexamethasone (DECADRON) tablet 8 mg (8 mg Oral Given 02/10/22 0746)    ED Course/ Medical Decision Making/ A&P                           Medical Decision Making Amount and/or Complexity of Data Reviewed Labs: ordered.  Risk OTC drugs. Prescription drug management.   This patient presents to the ED with chief complaint(s) of ha with pertinent past medical history of recurrent ha which further complicates the presenting complaint. The complaint involves an extensive differential diagnosis and also carries with it a high risk of complications and morbidity.     Differential diagnosis includes but is not exclusive to subarachnoid hemorrhage, meningitis, encephalitis, previous head trauma, cavernous venous thrombosis, muscle tension headache, glaucoma, temporal arteritis, migraine or migraine equivalent, etc. . Serious etiologies were considered.   The initial plan is to screening labs, migraine cocktail, ivf   Additional history obtained: Additional history obtained from  friend Records reviewed  prior ed eval, prior labs/imaging/home meds  Independent labs interpretation:  The following labs were independently interpreted:  Preg neg Cmp stable Cbc wnl  Independent visualization of imaging:  Cardiac monitoring was reviewed and interpreted by myself which shows nsr  Treatment and Reassessment: Reglan, diphenhydramine, Toradol, mag sulfate, IV fluids Decadron Fentanyl, pt ended up refusing this medication; worried about overdose stories she has seen on the news Lidoderm patch >>  symptoms greatly improved, pt able to  fall asleep, tolerating PO w/o difficulty, no n/v, neuro non-focal  Consultation: - Consulted or discussed management/test interpretation w/ external professional: na  Consideration for admission or further workup: Admission was considered   Patient presents with headache. Based on the patient's history and physical there is very low clinical suspicion for significant intracranial pathology. The headache was not sudden onset, not maximal at onset, there are no neurologic findings on exam, the patient does not have a fever, the patient does not have any jaw claudication, the patient does not endorse a clotting disorder, patient denies any trauma or eye pain and the headache is not associated with dizziness, weakness on one side of the body, diplopia, vertigo, slurred speech, or ataxia. Given the extremely low risk of these diagnoses further testing and evaluation for these possibilities does not appear to be indicated at this time.   She has headache, family hx migraines. Pt with recurrent sinusitis, sees allergy clinic. She is averse to taking medications, will trial otc apap/nsaids and phenergan for home. Also start antihistamine to see if this helps with her sinusitis. F/u with neurology for recurrent ha  The patient improved significantly and was discharged in stable condition. Detailed discussions were had with the patient regarding current findings, and need for close f/u with PCP or on call doctor. The patient has been instructed to return immediately if the symptoms worsen in any way for re-evaluation. Patient verbalized understanding and is in agreement with current care plan. All questions answered prior to discharge.  Social Determinants of health: Social History   Tobacco Use   Smoking status: Never   Smokeless tobacco: Never  Vaping Use   Vaping Use: Never used  Substance Use Topics   Alcohol use: Yes    Comment: rarely   Drug use: No            Final Clinical  Impression(s) / ED Diagnoses Final diagnoses:  Bad headache    Rx / DC Orders ED Discharge Orders          Ordered    Ambulatory referral to Neurology       Comments: An appointment is requested in approximately: 2 weeks   02/10/22 0740    promethazine (PHENERGAN) 25 MG tablet  Every 6 hours PRN        02/10/22 0741    lidocaine (LIDODERM) 5 %  Daily PRN        02/10/22 0741    ibuprofen (ADVIL) 600 MG tablet  Every 6 hours PRN        02/10/22 0751    acetaminophen (TYLENOL) 325 MG tablet  Every 6 hours PRN        02/10/22 0751    cetirizine (ZYRTEC ALLERGY) 10 MG tablet  Daily        02/10/22 0755              Sloan Leiter, DO 02/10/22 [phone]

## 2022-02-10 NOTE — Discharge Instructions (Addendum)
It was a pleasure caring for you today in the emergency department. ° °Please return to the emergency department for any worsening or worrisome symptoms. ° ° °

## 2022-02-10 NOTE — ED Notes (Signed)
Discharge instructions were discussed with pt. Pt verbalized understanding with no questions at this time. Pt to go home with friend at bedside.

## 2022-02-10 NOTE — ED Notes (Addendum)
Pt brought back to room 6. Was given medications in triage. Pt states resolution of nausea and headache now rating 8/10. Friend at bedside. Pt comfortable. Feeling sleepy after medications. Call bell within reach.

## 2022-02-11 ENCOUNTER — Encounter: Payer: Self-pay | Admitting: Neurology

## 2022-04-08 IMAGING — DX DG CHEST 2V
2 series · 2 of 2 positions shown · non-contrast
Comparison: July 23, 2014.

CLINICAL DATA: Chest pain.

EXAM:
CHEST - 2 VIEW

[chest pa]
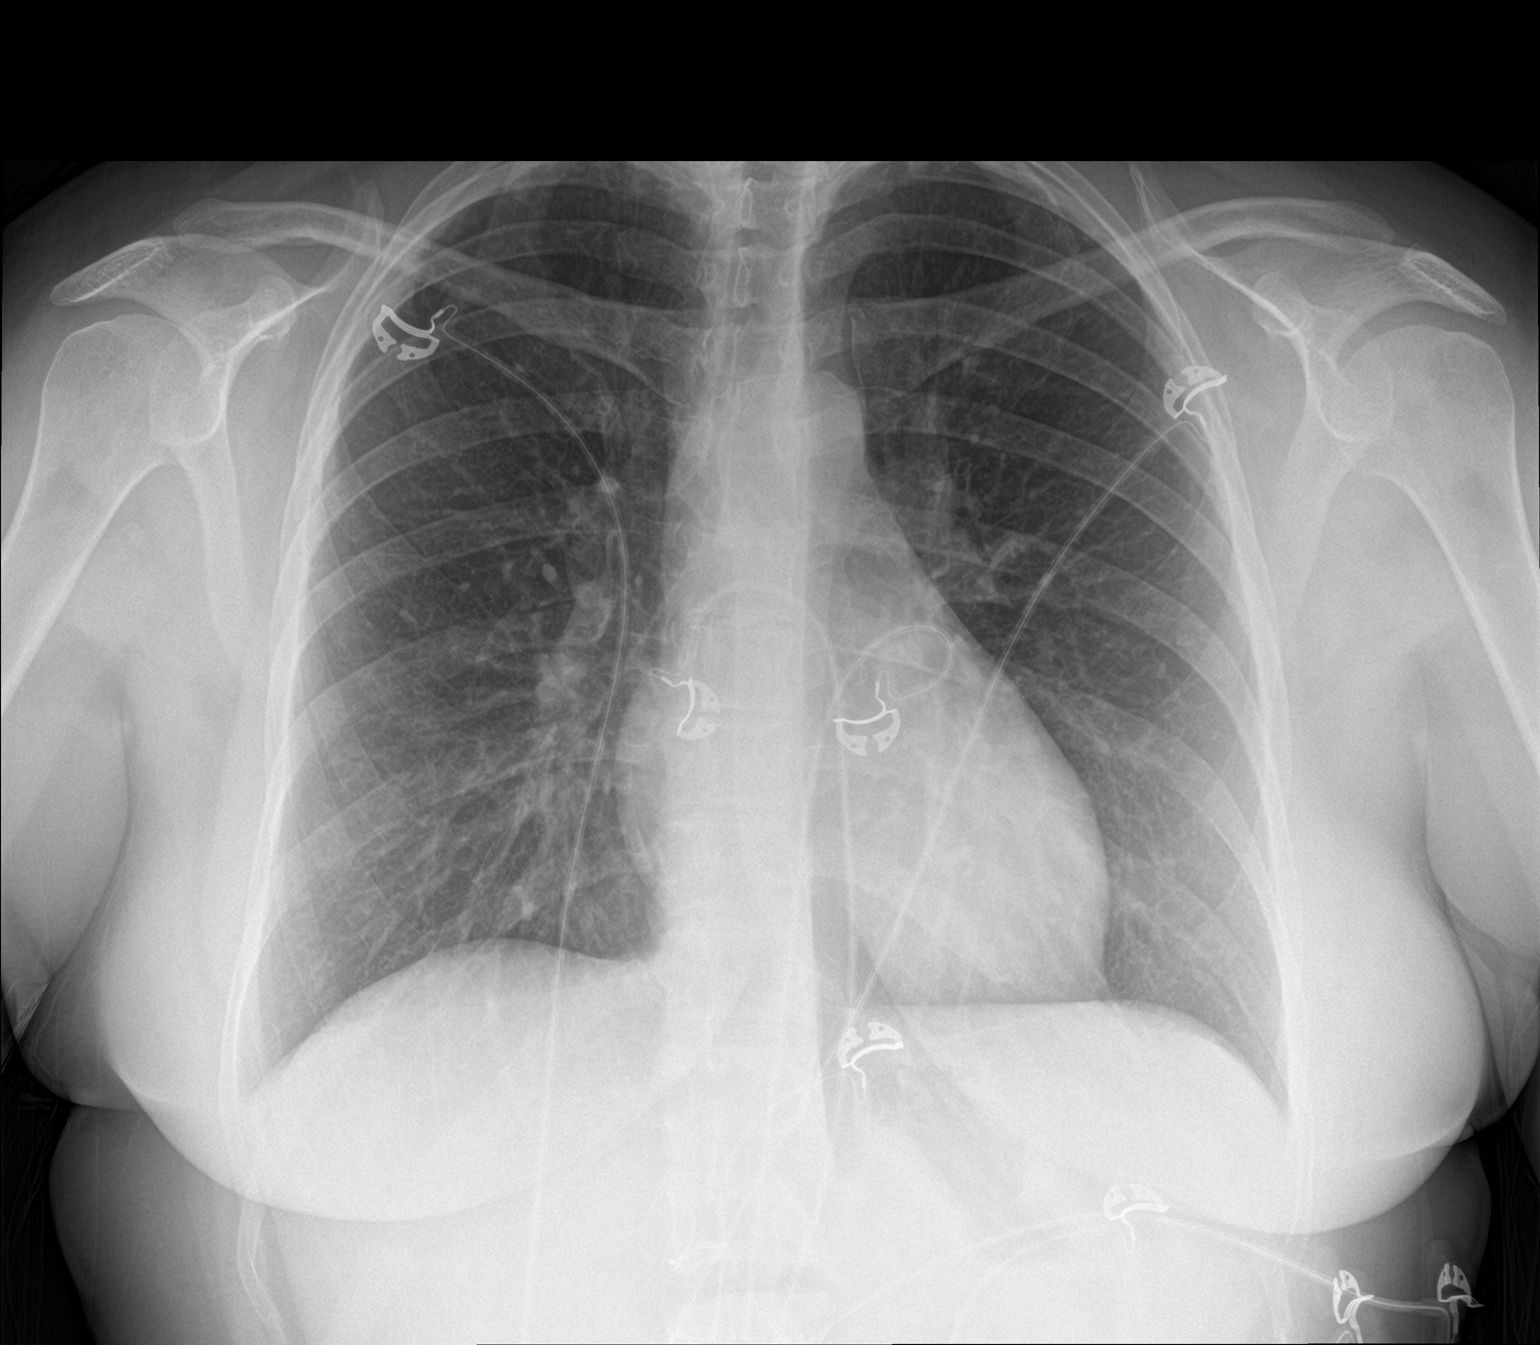

[chest lat]
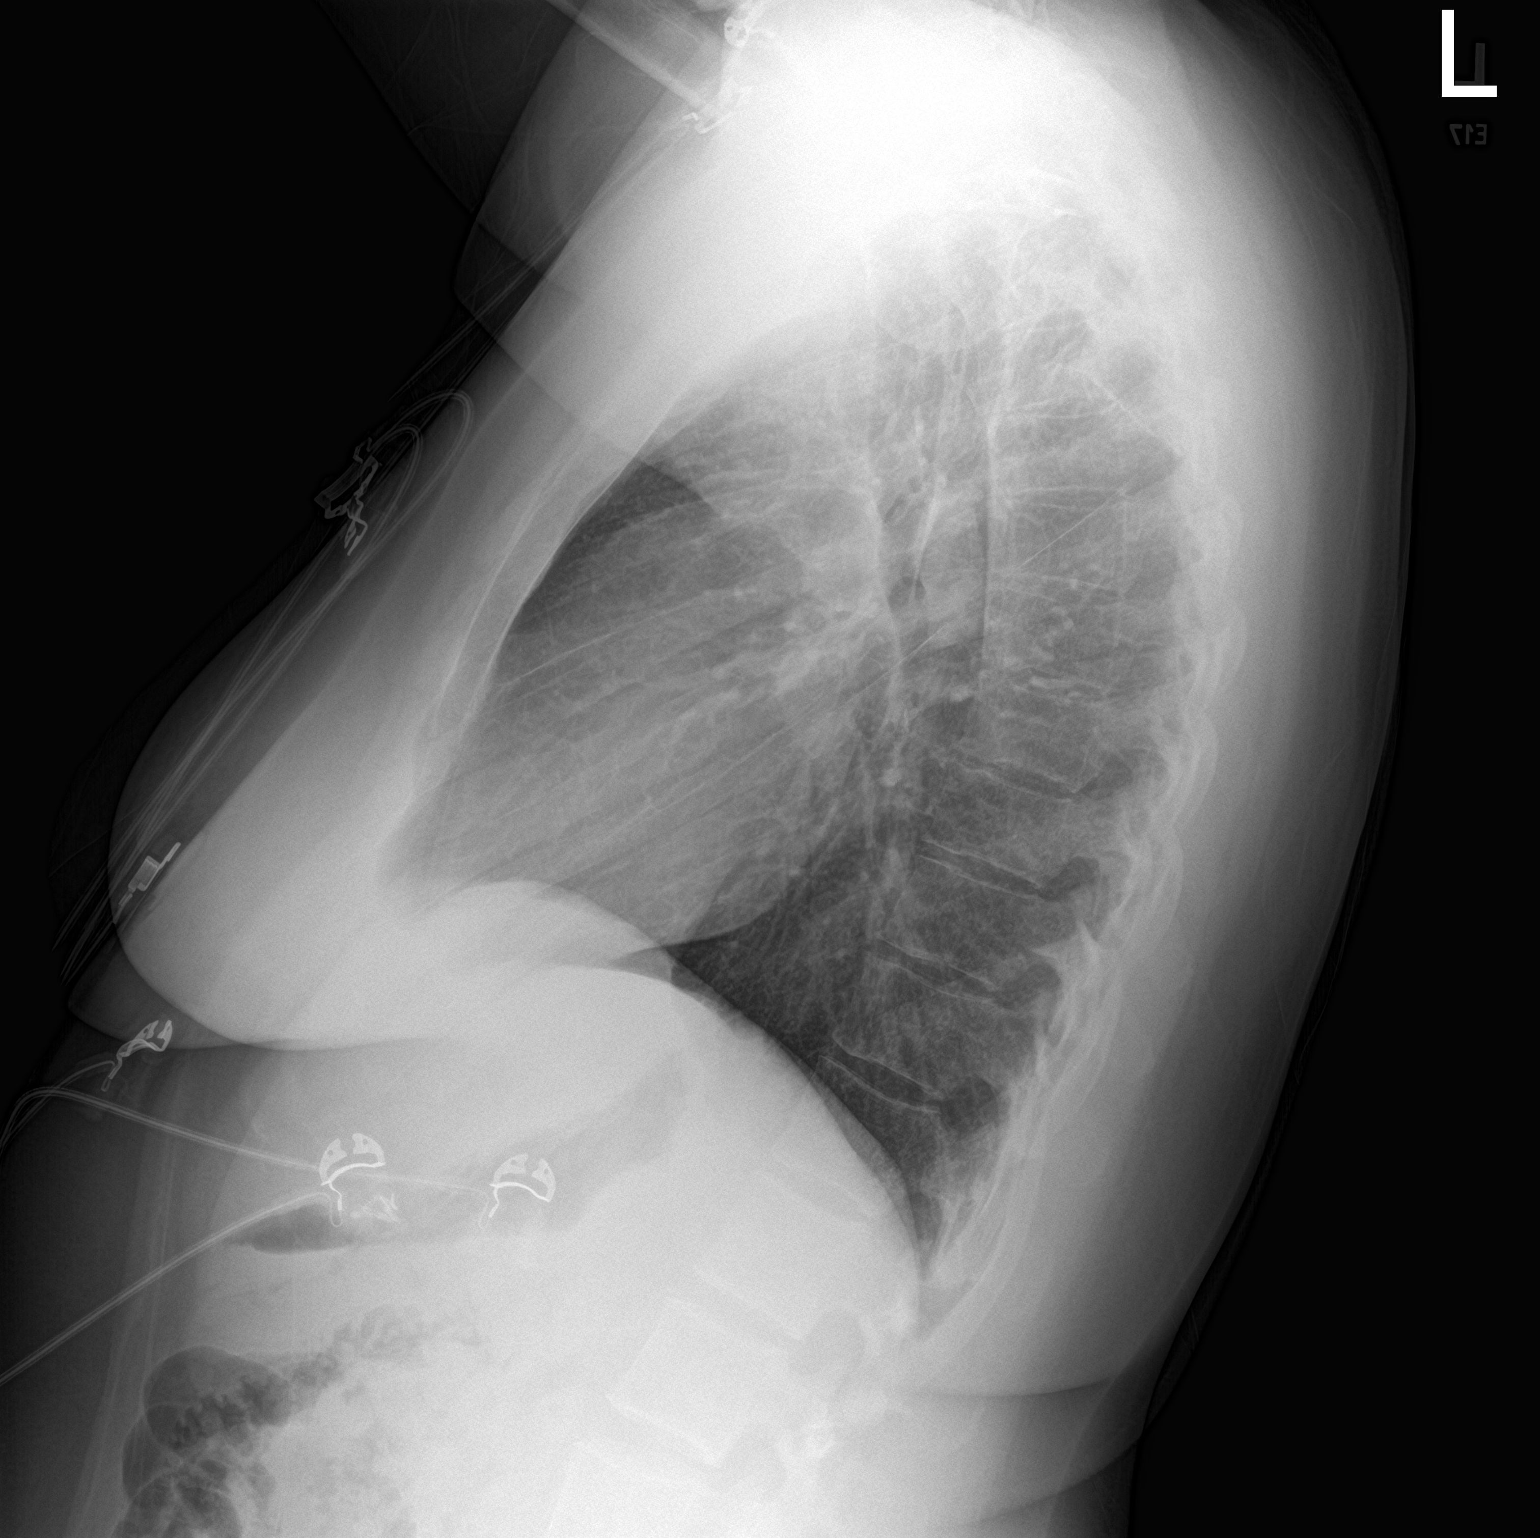

[2 of 2 positions shown; findings below may reference images not displayed]

FINDINGS: The heart size and mediastinal contours are within normal limits.
Both lungs are clear. The visualized skeletal structures are
unremarkable.
IMPRESSION: No active cardiopulmonary disease.

## 2022-07-01 NOTE — Progress Notes (Deleted)
NEUROLOGY CONSULTATION NOTE  [name] MRN: [id number] DOB: Mar 04, 1991  Referring provider: Wynona Dove, DO (ED referral) Primary care provider: London Pepper, MD  Reason for consult:  headache  Assessment/Plan:   ***   Subjective:  Kristen Davidson is a 32 year old female with asthma and anxiety who presents for headaches.  History supplemented by ED note.  Onset:  *** Location:  bilateral temporal radiating into neck Quality:  pulsatile, Intensity:  ***.   Aura:  *** Prodrome:  *** Associated symptoms:  Nausea, photophobia.  She denies associated vomiting, visual disturbance, unilateral numbness or weakness. Duration:  *** Frequency:  *** Frequency of abortive medication: *** Triggers:  *** Relieving factors:  *** Activity:  ***  She had an intractable headache not responding to pain relievers on 02/09/2022, for which she went to the ED.  Treated with migraine cocktail.    Prior head CT from 3/***/2022 to evaluate headache personally reviewed revealed no acute intracranial abnormality.  Past NSAIDS/analgesics:  Mobic Past abortive triptans:  *** Past abortive ergotamine:  *** Past muscle relaxants:  Flexeril Past anti-emetic:  Zofran Past antihypertensive medications:  *** Past antidepressant medications:  *** Past anticonvulsant medications:  *** Past anti-CGRP:  *** Past vitamins/Herbal/Supplements:  *** Past antihistamines/decongestants:  Zyrtec, Astellin, meclizine, Afrin, Sudafed Other past therapies:  ***  Current NSAIDS/analgesics:  acetaminophen, ibuprofen Current triptans:  none Current ergotamine:  none Current anti-emetic:  promethazine 25mg  *** Current muscle relaxants:  none Current Antihypertensive medications:  none Current Antidepressant medications:  none Current Anticonvulsant medications:  none Current anti-CGRP:  none Current Vitamins/Herbal/Supplements:  none Current Antihistamines/Decongestants:  Flonase *** Other therapy:   none Birth control:  none   Caffeine:  *** Alcohol:  *** Smoker:  *** Diet:  *** Exercise:  *** Depression:  ***; Anxiety:  *** Other pain:  *** Sleep hygiene:  *** Family history of headache:  mother (migraines)      PAST MEDICAL HISTORY: Past Medical History:  Diagnosis Date   Abnormal Pap smear    Allergy    Anxiety    Asthma    Headache(784.0)    PONV (postoperative nausea and vomiting)     PAST SURGICAL HISTORY: Past Surgical History:  Procedure Laterality Date   CHOLECYSTECTOMY N/A 10/09/2012   Procedure: LAPAROSCOPIC CHOLECYSTECTOMY WITH INTRAOPERATIVE CHOLANGIOGRAM;  Surgeon: Shann Medal, MD;  Location: [place];  Service: General;  Laterality: N/A;   COLPOSCOPY     TONSILLECTOMY     WISDOM TOOTH EXTRACTION      MEDICATIONS: Current Outpatient Medications on File Prior to Visit  Medication Sig Dispense Refill   acetaminophen (TYLENOL) 325 MG tablet Take 2 tablets (650 mg total) by mouth every 6 (six) hours as needed. 36 tablet 0   albuterol (VENTOLIN HFA) 108 (90 Base) MCG/ACT inhaler Inhale 2 puffs into the lungs every 4 (four) hours as needed for wheezing or shortness of breath. 18 g 2   azelastine (ASTELIN) 0.1 % nasal spray Place 2 sprays into both nostrils 2 (two) times daily for 7 days. Use in each nostril as directed 30 mL 0   cetirizine (ZYRTEC ALLERGY) 10 MG tablet Take 1 tablet (10 mg total) by mouth daily for 14 days. 14 tablet 0   doxycycline (VIBRAMYCIN) 100 MG capsule Take 1 capsule (100 mg total) by mouth 2 (two) times daily. 14 capsule 0   fluconazole (DIFLUCAN) 150 MG tablet Take 1 tablet (150 mg total) by mouth daily. Take second dose 72 hours  later if symptoms still persists. 2 tablet 0   fluticasone (FLONASE) 50 MCG/ACT nasal spray Place 1 spray into both nostrils daily. 15.8 mL 2   ibuprofen (ADVIL) 600 MG tablet Take 1 tablet (600 mg total) by mouth every 6 (six) hours as needed. 30 tablet 0   lidocaine (LIDODERM) 5 % Place 1 patch onto  the skin daily as needed. Remove & Discard patch within 12 hours or as directed by MD 15 patch 0   meclizine (ANTIVERT) 25 MG tablet Take 1 tablet (25 mg total) by mouth 3 (three) times daily as needed for dizziness. 30 tablet 0   norethindrone (MICRONOR) 0.35 MG tablet Take 0.35 mg by mouth daily.     oxymetazoline (AFRIN NASAL SPRAY) 0.05 % nasal spray Place 1 spray into both nostrils 2 (two) times daily. Use for 3 days only 30 mL 0   predniSONE (DELTASONE) 50 MG tablet Take 1 tablet (50 mg total) by mouth daily with breakfast. 5 tablet 0   promethazine (PHENERGAN) 25 MG tablet Take 1 tablet (25 mg total) by mouth every 6 (six) hours as needed (headache). 20 tablet 0   No current facility-administered medications on file prior to visit.    ALLERGIES: No Known Allergies  FAMILY HISTORY: Family History  Problem Relation Age of Onset   Heart disease Maternal Grandfather    Diabetes Maternal Grandfather    Anxiety disorder Maternal Grandfather    Hypertension Mother    Heart disease Mother        PVCs   Anemia Mother    Thyroid disease Mother    Migraines Mother    Lupus Mother    Rheum arthritis Mother    Fibromyalgia Mother    Hypertension Father    Heart disease Father    Diabetes Maternal Aunt    Kidney disease Maternal Aunt    Thyroid disease Maternal Grandmother    Cancer Paternal Grandmother        lymphoma   Cancer Paternal Grandfather        prostate   Healthy Brother    Asthma Daughter    Healthy Daughter    Healthy Daughter    Healthy Daughter    Anesthesia problems Neg Hx    Hypotension Neg Hx    Malignant hyperthermia Neg Hx    Pseudochol deficiency Neg Hx     Objective:  *** General: No acute distress.  Patient appears well-groomed.   Head:  Normocephalic/atraumatic Eyes:  fundi examined but not visualized Neck: supple, no paraspinal tenderness, full range of motion Back: No paraspinal tenderness Heart: regular rate and rhythm Lungs: Clear to  auscultation bilaterally. Vascular: No carotid bruits. Neurological Exam: Mental status: alert and oriented to person, place, and time, speech fluent and not dysarthric, language intact. Cranial nerves: CN I: not tested CN II: pupils equal, round and reactive to light, visual fields intact CN III, IV, VI:  full range of motion, no nystagmus, no ptosis CN V: facial sensation intact. CN VII: upper and lower face symmetric CN VIII: hearing intact CN IX, X: gag intact, uvula midline CN XI: sternocleidomastoid and trapezius muscles intact CN XII: tongue midline Bulk & Tone: normal, no fasciculations. Motor:  muscle strength 5/5 throughout Sensation:  Pinprick, temperature and vibratory sensation intact. Deep Tendon Reflexes:  2+ throughout,  toes downgoing.   Finger to nose testing:  Without dysmetria.   Heel to shin:  Without dysmetria.   Gait:  Normal station and stride.  Romberg negative.  Thank you for allowing me to take part in the care of this patient.  Metta Clines, DO  CC:  London Pepper, MD

## 2022-07-02 ENCOUNTER — Ambulatory Visit: Payer: Medicaid Other | Admitting: Neurology

## 2022-09-17 ENCOUNTER — Other Ambulatory Visit (HOSPITAL_COMMUNITY): Payer: Self-pay | Admitting: Family Medicine

## 2022-09-17 DIAGNOSIS — M25512 Pain in left shoulder: Secondary | ICD-10-CM

## 2022-10-17 ENCOUNTER — Other Ambulatory Visit (HOSPITAL_COMMUNITY): Payer: Self-pay | Admitting: Family Medicine

## 2022-10-17 DIAGNOSIS — R109 Unspecified abdominal pain: Secondary | ICD-10-CM

## 2023-01-16 LAB — CMP 10231: EGFR: 119

## 2023-02-07 NOTE — Progress Notes (Signed)
Office Visit Note  Patient: Kristen Davidson             Date of Birth: April 21, 1990           MRN: [id number]             PCP: Farris Has, MD Referring: Farris Has, MD Visit Date: 02/19/2023 Occupation: @GUAROCC @  Subjective:  No chief complaint on file.   History of Present Illness: Kristen Davidson is a 32 y.o. female returns today after her last visit on March 09, 2020.  At the time she was evaluated for arthralgias and myalgias.  Her autoimmune workup was negative.  She was postpartum and also had parvovirus infection previous to that.  She had initial positive serology which became negative.  X-rays were normal and the ultrasound of bilateral hands was negative for synovitis and tenosynovitis.  She states she continues to have pain and stiffness in her bilateral hands.  She denies any discomfort in her feet now.  She denies morning stiffness.  There is no history of oral ulcers, nasal ulcers, malar rash, photosensitivity, Raynaud's or lymphadenopathy.  Complains of discomfort in the trapezius region.  She has been having left shoulder joint discomfort.  Patient states she was evaluated by Dr. Penni Bombard at [facility].  Patient states the x-rays were unremarkable.  She will be starting physical therapy.    Activities of Daily Living:  Patient reports morning stiffness for 0 minutes.   Patient Denies nocturnal pain.  Difficulty dressing/grooming: Denies Difficulty climbing stairs: Denies Difficulty getting out of chair: Denies Difficulty using hands for taps, buttons, cutlery, and/or writing: Denies  Review of Systems  Constitutional:  Positive for fatigue.  HENT:  Negative for mouth sores and mouth dryness.   Eyes:  Negative for dryness.  Respiratory:  Negative for shortness of breath.   Cardiovascular:  Negative for chest pain and palpitations.  Gastrointestinal:  Negative for blood in stool, constipation and diarrhea.  Endocrine: Negative for increased urination.   Genitourinary:  Negative for involuntary urination.  Musculoskeletal:  Positive for joint pain, joint pain, myalgias, muscle tenderness and myalgias. Negative for gait problem, joint swelling, muscle weakness and morning stiffness.  Skin:  Negative for color change, rash, hair loss and sensitivity to sunlight.  Allergic/Immunologic: Positive for susceptible to infections.  Neurological:  Positive for dizziness and headaches.  Hematological:  Negative for swollen glands.  Psychiatric/Behavioral:  Negative for depressed mood and sleep disturbance. The patient is not nervous/anxious.     PMFS History:  Patient Active Problem List   Diagnosis Date Noted   History of asthma 02/14/2020   Family history of Sjogren's disease 02/14/2020   History of anxiety 02/14/2020   Term pregnancy 10/12/2019   Pregnancy 09/22/2017   H. pylori infection 08/18/2012   Chronic back pain 08/18/2012    Past Medical History:  Diagnosis Date   Abnormal Pap smear    Allergy    Anxiety    Asthma    Headache(784.0)    PONV (postoperative nausea and vomiting)     Family History  Problem Relation Age of Onset   Heart disease Maternal Grandfather    Diabetes Maternal Grandfather    Anxiety disorder Maternal Grandfather    Hypertension Mother    Heart disease Mother        PVCs   Anemia Mother    Thyroid disease Mother    Migraines Mother    Lupus Mother    Rheum arthritis Mother    Fibromyalgia  Mother    Hypertension Father    Heart disease Father    Diabetes Maternal Aunt    Kidney disease Maternal Aunt    Thyroid disease Maternal Grandmother    Cancer Paternal Grandmother        lymphoma   Cancer Paternal Grandfather        prostate   Healthy Brother    Asthma Daughter    Healthy Daughter    Healthy Daughter    Healthy Daughter    Anesthesia problems Neg Hx    Hypotension Neg Hx    Malignant hyperthermia Neg Hx    Pseudochol deficiency Neg Hx    Past Surgical History:  Procedure  Laterality Date   CHOLECYSTECTOMY N/A 10/09/2012   Procedure: LAPAROSCOPIC CHOLECYSTECTOMY WITH INTRAOPERATIVE CHOLANGIOGRAM;  Surgeon: Kandis Cocking, MD;  Location: [facility] OR;  Service: General;  Laterality: N/A;   COLPOSCOPY     TONSILLECTOMY     WISDOM TOOTH EXTRACTION     Social History   Social History Narrative   Not on file   Immunization History  Administered Date(s) Administered   Influenza Split 01/25/2012   Influenza-Unspecified 05/02/2016     Objective: Vital Signs: BP 119/81 (BP Location: Left Arm, Patient Position: Sitting, Cuff Size: Normal)   Pulse 76   Resp 15   Ht 5\' 7"  (1.702 m)   Wt 193 lb 3.2 oz (87.6 kg)   BMI 30.26 kg/m    Physical Exam Vitals and nursing note reviewed.  Constitutional:      Appearance: She is well-developed.  HENT:     Head: Normocephalic and atraumatic.  Eyes:     Conjunctiva/sclera: Conjunctivae normal.  Cardiovascular:     Rate and Rhythm: Normal rate and regular rhythm.     Heart sounds: Normal heart sounds.  Pulmonary:     Effort: Pulmonary effort is normal.     Breath sounds: Normal breath sounds.  Abdominal:     General: Bowel sounds are normal.     Palpations: Abdomen is soft.  Musculoskeletal:     Cervical back: Normal range of motion.  Lymphadenopathy:     Cervical: No cervical adenopathy.  Skin:    General: Skin is warm and dry.     Capillary Refill: Capillary refill takes less than 2 seconds.  Neurological:     Mental Status: She is alert and oriented to person, place, and time.  Psychiatric:        Behavior: Behavior normal.     Musculoskeletal Exam: Cervical spine with good range of motion.  She had bilateral trapezius spasm.  She discomfort range of motion of her left shoulder joint.  Right shoulder was in full range of motion.  Elbow joints, wrist joints, MCPs PIPs and DIPs been good range of motion with no synovitis.  Hip joints, knee joints were in good range of motion without any warmth swelling or  effusion.  There was no tenderness over ankles or MTPs.  CDAI Exam: CDAI Score: -- Patient Global: --; Provider Global: -- Swollen: --; Tender: -- Joint Exam 02/19/2023   No joint exam has been documented for this visit   There is currently no information documented on the homunculus. Go to the Rheumatology activity and complete the homunculus joint exam.  Investigation: No additional findings.  Imaging: No results found.  Recent Labs: Lab Results  Component Value Date   WBC 7.9 02/09/2022   HGB 12.4 02/09/2022   PLT 257 02/09/2022   NA 137 02/09/2022  K 3.5 02/09/2022   CL 101 02/09/2022   CO2 27 02/09/2022   GLUCOSE 106 (H) 02/09/2022   BUN 17 02/09/2022   CREATININE 0.64 02/09/2022   BILITOT 0.4 02/09/2022   ALKPHOS 93 02/09/2022   AST 20 02/09/2022   ALT 25 02/09/2022   PROT 8.7 (H) 02/09/2022   ALBUMIN 4.8 02/09/2022   CALCIUM 9.5 02/09/2022   GFRAA 144 03/09/2020   January 15, 2023 CMP normal with GFR 119, AST 24, ALT 30, CBC WBC 5.9, hemoglobin 12.3, platelets 226  Speciality Comments: No specialty comments available.  Procedures:  No procedures performed Allergies: Patient has no known allergies.   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  ANA was negative and November 2021.  Patient denies any history of oral ulcers, nasal ulcers, malar rash, photosensitivity, Raynaud's phenomenon or lymphadenopathy.  She continues to have arthralgias and myalgias.  Pain in both hands -she complains of pain and discomfort in her bilateral hands..  Her sed rate was elevated in the past.  Autoimmune work-up was negative. X-rays obtained in the past were unremarkable. Ultrasound obtained in the past did not show any synovitis or tenosynovitis.Bilateral median nerves within normal limits. -She has been having increased pain  and discomfort in her bilateral hands.  No synovitis was noted.  I will obtain labs today.  I offered ultrasound which she declined.  Plan: Sedimentation rate, Rheumatoid factor, Cyclic citrul peptide antibody, IgG, ANA.  I will contact her with the lab results.  A handout on hand exercises was given.  If her labs come abnormal then we will schedule her for a follow-up visit.  Pain in both feet -patient had h/o intermittent severe pain in her feet to the point that she has been using crutches.  X-rays were unremarkable at the last visit.  Patient states she is did not have any recurrence of feet discomfort.  Chronic SI joint pain -resolved.  HLA-B27 negative, x-rays were unremarkable.  Trapezius muscle spasm-she has been having bilateral trapezius muscle spasm.  A handout on neck exercises was given.  Myalgia -she continues to have some generalized pain in her muscles.  No muscular weakness was noted.  Plan: CK  Other fatigue -she continues to have fatigue.  Plan: TSH  Family history of Sjogren's disease - History of Sjogren's and ILD in her mother.  Family history of fibromyalgia - Mother  History of asthma  History of anxiety  History of Helicobacter pylori infection  Orders: Orders Placed This Encounter  Procedures   Sedimentation rate   CK   TSH   Rheumatoid factor   Cyclic citrul peptide antibody, IgG   ANA   No orders of the defined types were placed in this encounter.    Follow-Up Instructions: No follow-ups on file.   Pollyann Savoy, MD  Note - This record has been created using Animal nutritionist.  Chart creation errors have been sought, but may not always  have been located. Such creation errors do not reflect on  the standard of medical care.

## 2023-02-19 ENCOUNTER — Encounter: Payer: Self-pay | Admitting: Rheumatology

## 2023-02-19 ENCOUNTER — Ambulatory Visit: Payer: BC Managed Care – PPO | Attending: Rheumatology | Admitting: Rheumatology

## 2023-02-19 VITALS — BP 119/81 | HR 76 | Resp 15 | Ht 67.0 in | Wt 193.2 lb

## 2023-02-19 DIAGNOSIS — M533 Sacrococcygeal disorders, not elsewhere classified: Secondary | ICD-10-CM

## 2023-02-19 DIAGNOSIS — M79671 Pain in right foot: Secondary | ICD-10-CM

## 2023-02-19 DIAGNOSIS — M2550 Pain in unspecified joint: Secondary | ICD-10-CM | POA: Diagnosis not present

## 2023-02-19 DIAGNOSIS — R5383 Other fatigue: Secondary | ICD-10-CM

## 2023-02-19 DIAGNOSIS — Z8709 Personal history of other diseases of the respiratory system: Secondary | ICD-10-CM

## 2023-02-19 DIAGNOSIS — Z8619 Personal history of other infectious and parasitic diseases: Secondary | ICD-10-CM

## 2023-02-19 DIAGNOSIS — Z8269 Family history of other diseases of the musculoskeletal system and connective tissue: Secondary | ICD-10-CM

## 2023-02-19 DIAGNOSIS — G8929 Other chronic pain: Secondary | ICD-10-CM

## 2023-02-19 DIAGNOSIS — M791 Myalgia, unspecified site: Secondary | ICD-10-CM

## 2023-02-19 DIAGNOSIS — M79641 Pain in right hand: Secondary | ICD-10-CM

## 2023-02-19 DIAGNOSIS — M79672 Pain in left foot: Secondary | ICD-10-CM

## 2023-02-19 DIAGNOSIS — M62838 Other muscle spasm: Secondary | ICD-10-CM

## 2023-02-19 DIAGNOSIS — Z8659 Personal history of other mental and behavioral disorders: Secondary | ICD-10-CM

## 2023-02-19 DIAGNOSIS — M79642 Pain in left hand: Secondary | ICD-10-CM

## 2023-02-19 NOTE — Patient Instructions (Addendum)
Cervical Strain and Sprain Rehab Ask your health care provider which exercises are safe for you. Do exercises exactly as told by your health care provider and adjust them as directed. It is normal to feel mild stretching, pulling, tightness, or discomfort as you do these exercises. Stop right away if you feel sudden pain or your pain gets worse. Do not begin these exercises until told by your health care provider. Stretching and range-of-motion exercises Cervical side bending  Using good posture, sit on a stable chair or stand up. Without moving your shoulders, slowly tilt your left / right ear to your shoulder until you feel a stretch in the neck muscles on the opposite side. You should be looking straight ahead. Hold for __________ seconds. Repeat with the other side of your neck. Repeat __________ times. Complete this exercise __________ times a day. Cervical rotation  Using good posture, sit on a stable chair or stand up. Slowly turn your head to the side as if you are looking over your left / right shoulder. Keep your eyes level with the ground. Stop when you feel a stretch along the side and the back of your neck. Hold for __________ seconds. Repeat this by turning to your other side. Repeat __________ times. Complete this exercise __________ times a day. Thoracic extension and pectoral stretch  Roll a towel or a small blanket so it is about 4 inches (10 cm) in diameter. Lie down on your back on a firm surface. Put the towel in the middle of your back across your spine. It should not be under your shoulder blades. Put your hands behind your head and let your elbows fall out to your sides. Hold for __________ seconds. Repeat __________ times. Complete this exercise __________ times a day. Strengthening exercises Upper cervical flexion  Lie on your back with a thin pillow behind your head or a small, rolled-up towel under your neck. Gently tuck your chin toward your chest and nod  your head down to look toward your feet. Do not lift your head off the pillow. Hold for __________ seconds. Release the tension slowly. Relax your neck muscles completely before you repeat this exercise. Repeat __________ times. Complete this exercise __________ times a day. Cervical extension  Stand about 6 inches (15 cm) away from a wall, with your back facing the wall. Place a soft object, about 6-8 inches (15-20 cm) in diameter, between the back of your head and the wall. A soft object could be a small pillow, a ball, or a folded towel. Gently tilt your head back and press into the soft object. Keep your jaw and forehead relaxed. Hold for __________ seconds. Release the tension slowly. Relax your neck muscles completely before you repeat this exercise. Repeat __________ times. Complete this exercise __________ times a day. Posture and body mechanics Body mechanics refer to the movements and positions of your body while you do your daily activities. Posture is part of body mechanics. Good posture and healthy body mechanics can help to relieve stress in your body's tissues and joints. Good posture means that your spine is in its natural S-curve position (your spine is neutral), your shoulders are pulled back slightly, and your head is not tipped forward. The following are general guidelines for using improved posture and body mechanics in your everyday activities. Sitting  When sitting, keep your spine neutral and keep your feet flat on the floor. Use a footrest, if needed, and keep your thighs parallel to the floor. Avoid rounding  your shoulders. Avoid tilting your head forward. When working at a desk or a computer, keep your desk at a height where your hands are slightly lower than your elbows. Slide your chair under your desk so you are close enough to maintain good posture. When working at a computer, place your monitor at a height where you are looking straight ahead and you do not have to  tilt your head forward or downward to look at the screen. Standing  When standing, keep your spine neutral and keep your feet about hip-width apart. Keep a slight bend in your knees. Your ears, shoulders, and hips should line up. When you do a task in which you stand in one place for a long time, place one foot up on a stable object that is 2-4 inches (5-10 cm) high, such as a footstool. This helps keep your spine neutral. Resting When lying down and resting, avoid positions that are most painful for you. Try to support your neck in a neutral position. You can use a contour pillow or a small rolled-up towel. Your pillow should support your neck but not push on it. This information is not intended to replace advice given to you by your health care provider. Make sure you discuss any questions you have with your health care provider. Document Revised: 07/22/2022 Document Reviewed: 10/08/2021 Elsevier Patient Education  2024 [employer] Hand Exercises Hand exercises can be helpful for almost anyone. They can strengthen your hands and improve flexibility and movement. The exercises can also increase blood flow to the hands. These results can make your work and daily tasks easier for you. Hand exercises can be especially helpful for people who have joint pain from arthritis or nerve damage from using their hands over and over. These exercises can also help people who injure a hand. Exercises Most of these hand exercises are gentle stretching and motion exercises. It is usually safe to do them often throughout the day. Warming up your hands before exercise may help reduce stiffness. You can do this with gentle massage or by placing your hands in warm water for 10-15 minutes. It is normal to feel some stretching, pulling, tightness, or mild discomfort when you begin new exercises. In time, this will improve. Remember to always be careful and stop right away if you feel sudden, very bad pain or your pain  gets worse. You want to get better and be safe. Ask your health care provider which exercises are safe for you. Do exercises exactly as told by your provider and adjust them as told. Do not begin these exercises until told by your provider. Knuckle bend or "claw" fist  Stand or sit with your arm, hand, and all five fingers pointed straight up. Make sure to keep your wrist straight. Gently bend your fingers down toward your palm until the tips of your fingers are touching your palm. Keep your big knuckle straight and only bend the small knuckles in your fingers. Hold this position for 10 seconds. Straighten your fingers back to your starting position. Repeat this exercise 5-10 times with each hand. Full finger fist  Stand or sit with your arm, hand, and all five fingers pointed straight up. Make sure to keep your wrist straight. Gently bend your fingers into your palm until the tips of your fingers are touching the middle of your palm. Hold this position for 10 seconds. Extend your fingers back to your starting position, stretching every joint fully. Repeat this exercise 5-10 times  with each hand. Straight fist  Stand or sit with your arm, hand, and all five fingers pointed straight up. Make sure to keep your wrist straight. Gently bend your fingers at the big knuckle, where your fingers meet your hand, and at the middle knuckle. Keep the knuckle at the tips of your fingers straight and try to touch the bottom of your palm. Hold this position for 10 seconds. Extend your fingers back to your starting position, stretching every joint fully. Repeat this exercise 5-10 times with each hand. Tabletop  Stand or sit with your arm, hand, and all five fingers pointed straight up. Make sure to keep your wrist straight. Gently bend your fingers at the big knuckle, where your fingers meet your hand, as far down as you can. Keep the small knuckles in your fingers straight. Think of forming a tabletop with  your fingers. Hold this position for 10 seconds. Extend your fingers back to your starting position, stretching every joint fully. Repeat this exercise 5-10 times with each hand. Finger spread  Place your hand flat on a table with your palm facing down. Make sure your wrist stays straight. Spread your fingers and thumb apart from each other as far as you can until you feel a gentle stretch. Hold this position for 10 seconds. Bring your fingers and thumb tight together again. Hold this position for 10 seconds. Repeat this exercise 5-10 times with each hand. Making circles  Stand or sit with your arm, hand, and all five fingers pointed straight up. Make sure to keep your wrist straight. Make a circle by touching the tip of your thumb to the tip of your index finger. Hold for 10 seconds. Then open your hand wide. Repeat this motion with your thumb and each of your fingers. Repeat this exercise 5-10 times with each hand. Thumb motion  Sit with your forearm resting on a table and your wrist straight. Your thumb should be facing up toward the ceiling. Keep your fingers relaxed as you move your thumb. Lift your thumb up as high as you can toward the ceiling. Hold for 10 seconds. Bend your thumb across your palm as far as you can, reaching the tip of your thumb for the small finger (pinkie) side of your palm. Hold for 10 seconds. Repeat this exercise 5-10 times with each hand. Grip strengthening  Hold a stress ball or other soft ball in the middle of your hand. Slowly increase the pressure, squeezing the ball as much as you can without causing pain. Think of bringing the tips of your fingers into the middle of your palm. All of your finger joints should bend when doing this exercise. Hold your squeeze for 10 seconds, then relax. Repeat this exercise 5-10 times with each hand. Contact a health care provider if: Your hand pain or discomfort gets much worse when you do an exercise. Your hand pain  or discomfort does not improve within 2 hours after you exercise. If you have either of these problems, stop doing these exercises right away. Do not do them again unless your provider says that you can. Get help right away if: You develop sudden, severe hand pain or swelling. If this happens, stop doing these exercises right away. Do not do them again unless your provider says that you can. This information is not intended to replace advice given to you by your health care provider. Make sure you discuss any questions you have with your health care provider. Document Revised: 04/02/2022  Document Reviewed: 04/02/2022 Elsevier Patient Education  2024 [employer].

## 2023-02-21 LAB — TSH: TSH: 0.9 m[IU]/L

## 2023-02-21 LAB — RHEUMATOID FACTOR: Rheumatoid fact SerPl-aCnc: <10 [IU]/mL (ref <14)

## 2023-02-21 LAB — SEDIMENTATION RATE: Sed Rate: 29 mm/h — ABNORMAL HIGH (ref 0–20)

## 2023-02-21 LAB — CK: Total CK: 59 U/L (ref 29–143)

## 2023-02-21 LAB — ANA: Anti Nuclear Antibody (ANA): NEGATIVE

## 2023-02-21 LAB — CYCLIC CITRUL PEPTIDE ANTIBODY, IGG: Cyclic Citrullin Peptide Ab: <16 U

## 2023-02-23 NOTE — Progress Notes (Signed)
Sed rate is mildly elevated.  CK normal, TSH normal, rheumatoid factor negative, anti-CCP negative, ANA negative.  Labs do not indicate an autoimmune disease.  Please notify patient and forward results to her PCP.

## 2023-02-24 NOTE — Progress Notes (Signed)
Patient may take Tylenol as needed for pain.  Labs do not indicate inflammation.

## 2023-03-29 ENCOUNTER — Emergency Department (HOSPITAL_BASED_OUTPATIENT_CLINIC_OR_DEPARTMENT_OTHER): Payer: BC Managed Care – PPO | Admitting: Radiology

## 2023-03-29 ENCOUNTER — Encounter (HOSPITAL_BASED_OUTPATIENT_CLINIC_OR_DEPARTMENT_OTHER): Payer: Self-pay

## 2023-03-29 ENCOUNTER — Other Ambulatory Visit: Payer: Self-pay

## 2023-03-29 DIAGNOSIS — S8011XA Contusion of right lower leg, initial encounter: Secondary | ICD-10-CM | POA: Diagnosis not present

## 2023-03-29 DIAGNOSIS — R519 Headache, unspecified: Secondary | ICD-10-CM | POA: Insufficient documentation

## 2023-03-29 DIAGNOSIS — W1782XA Fall from (out of) grocery cart, initial encounter: Secondary | ICD-10-CM | POA: Diagnosis not present

## 2023-03-29 DIAGNOSIS — S8991XA Unspecified injury of right lower leg, initial encounter: Secondary | ICD-10-CM | POA: Diagnosis present

## 2023-03-29 DIAGNOSIS — S40021A Contusion of right upper arm, initial encounter: Secondary | ICD-10-CM | POA: Insufficient documentation

## 2023-03-29 LAB — PREGNANCY, URINE: Preg Test, Ur: NEGATIVE

## 2023-03-29 NOTE — ED Triage Notes (Signed)
Fall tonight at [facility] after slipping on the floor and lack of wet floor signs.   Pt complaint of right hip, elbow, shoulder and wrist pain. Says she may have hit her head but unsure.   Denies LOC.

## 2023-03-30 ENCOUNTER — Emergency Department (HOSPITAL_BASED_OUTPATIENT_CLINIC_OR_DEPARTMENT_OTHER)
Admission: EM | Admit: 2023-03-30 | Discharge: 2023-03-30 | Disposition: A | Discharge status: Home / Self Care | Payer: BC Managed Care – PPO | Attending: Emergency Medicine | Admitting: Emergency Medicine

## 2023-03-30 DIAGNOSIS — W19XXXA Unspecified fall, initial encounter: Secondary | ICD-10-CM

## 2023-03-30 DIAGNOSIS — S8011XA Contusion of right lower leg, initial encounter: Secondary | ICD-10-CM

## 2023-03-30 DIAGNOSIS — S40021A Contusion of right upper arm, initial encounter: Secondary | ICD-10-CM

## 2023-03-30 MED ORDER — IBUPROFEN 400 MG PO TABS
600.0000 mg | ORAL_TABLET | Freq: Once | ORAL | Status: DC
  Filled 2023-03-30: qty 1

## 2023-03-30 NOTE — ED Provider Notes (Signed)
[place] EMERGENCY DEPARTMENT AT [facility]  Provider Note  CSN: [id number] Arrival date & time: 03/29/23 2145  History Chief Complaint  Patient presents with   Kristen Davidson is a 31 y.o. female reports she slipped and fell at the grocery store earlier tonight, landing on her R side. She does not think she hit her head or had any LOC. Has had some soft tissue pain in her R arm and leg since then. Mild headache, but no vomiting or confusion.   Home Medications Prior to Admission medications   Medication Sig Start Date End Date Taking? Authorizing Provider  acetaminophen (TYLENOL) 325 MG tablet Take 2 tablets (650 mg total) by mouth every 6 (six) hours as needed. 02/10/22   Sloan Leiter, DO  albuterol (VENTOLIN HFA) 108 (90 Base) MCG/ACT inhaler Inhale 2 puffs into the lungs every 4 (four) hours as needed for wheezing or shortness of breath. 05/06/19   Leftwich-Kirby, Wilmer Floor, CNM  azelastine (ASTELIN) 0.1 % nasal spray Place 2 sprays into both nostrils 2 (two) times daily for 7 days. Use in each nostril as directed Patient not taking: Reported on 02/19/2023 01/03/21 01/15/21  Amalia Greenhouse, FNP  cetirizine (ZYRTEC ALLERGY) 10 MG tablet Take 1 tablet (10 mg total) by mouth daily for 14 days. Patient not taking: Reported on 02/19/2023 02/10/22 02/24/22  Tanda Rockers A, DO  doxycycline (VIBRAMYCIN) 100 MG capsule Take 1 capsule (100 mg total) by mouth 2 (two) times daily. 10/31/20   Cathie Hoops, Amy V, PA-C  fluconazole (DIFLUCAN) 150 MG tablet Take 1 tablet (150 mg total) by mouth daily. Take second dose 72 hours later if symptoms still persists. 10/31/20   Cathie Hoops, Amy V, PA-C  fluticasone (FLONASE) 50 MCG/ACT nasal spray Place 1 spray into both nostrils daily. Patient not taking: Reported on 02/19/2023 06/29/20   Jacalyn Lefevre, MD  ibuprofen (ADVIL) 600 MG tablet Take 1 tablet (600 mg total) by mouth every 6 (six) hours as needed. 02/10/22   Tanda Rockers A, DO  lidocaine (LIDODERM) 5  % Place 1 patch onto the skin daily as needed. Remove & Discard patch within 12 hours or as directed by MD Patient not taking: Reported on 02/19/2023 02/10/22   Sloan Leiter, DO  meclizine (ANTIVERT) 25 MG tablet Take 1 tablet (25 mg total) by mouth 3 (three) times daily as needed for dizziness. Patient not taking: Reported on 02/19/2023 01/03/21   Amalia Greenhouse, FNP  norethindrone (MICRONOR) 0.35 MG tablet Take 0.35 mg by mouth daily. Patient not taking: Reported on 02/19/2023    [provider]  oxymetazoline (AFRIN NASAL SPRAY) 0.05 % nasal spray Place 1 spray into both nostrils 2 (two) times daily. Use for 3 days only Patient not taking: Reported on 02/19/2023 06/29/20   Jacalyn Lefevre, MD  predniSONE (DELTASONE) 50 MG tablet Take 1 tablet (50 mg total) by mouth daily with breakfast. 10/31/20   Belinda Fisher, PA-C  promethazine (PHENERGAN) 25 MG tablet Take 1 tablet (25 mg total) by mouth every 6 (six) hours as needed (headache). Patient not taking: Reported on 02/19/2023 02/10/22   Tanda Rockers A, DO     Allergies    Patient has no known allergies.   Review of Systems   Review of Systems Please see HPI for pertinent positives and negatives  Physical Exam BP 122/78   Pulse 79   Temp 98.7 F (37.1 C)   Resp 20   Ht 5\' 7"  (1.702  m)   Wt 86.2 kg   LMP  (LMP Unknown)   SpO2 100%   BMI 29.76 kg/m   Physical Exam Vitals and nursing note reviewed.  HENT:     Head: Normocephalic.     Nose: Nose normal.  Eyes:     Extraocular Movements: Extraocular movements intact.  Pulmonary:     Effort: Pulmonary effort is normal.  Musculoskeletal:        General: Tenderness (mild, soft tissue of R thigh and R upper arm, no bony tenderness) present. No swelling or deformity. Normal range of motion.     Cervical back: Neck supple.  Skin:    Findings: No rash (on exposed skin).  Neurological:     Mental Status: She is alert and oriented to person, place, and time.  Psychiatric:         Mood and Affect: Mood normal.     ED Results / Procedures / Treatments   EKG None  Procedures Procedures  Medications Ordered in the ED Medications - No data to display  Initial Impression and Plan  Patient here with mechanical fall and R arm and leg pain. I personally viewed the images from radiology studies and agree with radiologist interpretation: Xrays done in triage are negative for fracture. Recommend supportive care at home, rest, ice and OTC pain medications as needed. PCP follow up, RTED for any other concerns.    ED Course       MDM Rules/Calculators/A&P Medical Decision Making Problems Addressed: Contusion of right leg, initial encounter: acute illness or injury Contusion of right upper extremity, initial encounter: acute illness or injury Fall, initial encounter: acute illness or injury  Amount and/or Complexity of Data Reviewed Radiology: ordered and independent interpretation performed. Decision-making details documented in ED Course.  Risk OTC drugs.     Final Clinical Impression(s) / ED Diagnoses Final diagnoses:  Fall, initial encounter  Contusion of right leg, initial encounter  Contusion of right upper extremity, initial encounter    Rx / DC Orders ED Discharge Orders     None        Pollyann Savoy, MD 03/30/23 [phone]

## 2024-01-05 ENCOUNTER — Inpatient Hospital Stay (HOSPITAL_COMMUNITY)
Admission: AD | Admit: 2024-01-05 | Discharge: 2024-01-05 | Disposition: A | Discharge status: Home / Self Care | Attending: Obstetrics & Gynecology | Admitting: Obstetrics & Gynecology

## 2024-01-05 ENCOUNTER — Encounter (HOSPITAL_COMMUNITY): Payer: Self-pay | Admitting: Obstetrics & Gynecology

## 2024-01-05 DIAGNOSIS — O99511 Diseases of the respiratory system complicating pregnancy, first trimester: Secondary | ICD-10-CM | POA: Insufficient documentation

## 2024-01-05 DIAGNOSIS — O26891 Other specified pregnancy related conditions, first trimester: Secondary | ICD-10-CM

## 2024-01-05 DIAGNOSIS — J069 Acute upper respiratory infection, unspecified: Secondary | ICD-10-CM

## 2024-01-05 DIAGNOSIS — O98511 Other viral diseases complicating pregnancy, first trimester: Secondary | ICD-10-CM | POA: Insufficient documentation

## 2024-01-05 DIAGNOSIS — B9789 Other viral agents as the cause of diseases classified elsewhere: Secondary | ICD-10-CM | POA: Insufficient documentation

## 2024-01-05 DIAGNOSIS — Z3A11 11 weeks gestation of pregnancy: Secondary | ICD-10-CM | POA: Diagnosis not present

## 2024-01-05 LAB — RESP PANEL BY RT-PCR (RSV, FLU A&B, COVID)  RVPGX2
Influenza A by PCR: NEGATIVE
Influenza B by PCR: NEGATIVE
Resp Syncytial Virus by PCR: NEGATIVE
SARS Coronavirus 2 by RT PCR: NEGATIVE

## 2024-01-05 NOTE — MAU Note (Addendum)
 Kristen Davidson is a 33 y.o. at [redacted]w[redacted]d here in MAU reporting: Started feeling sick last Sunday lost voice and has a cough. Went to urgent care on Friday told her was viral .No Fever Still having a cough . Today and stared having a  migraine. Had some caffeine   but did not help. Did not take any tylenol    LMP:  Onset of complaint: Last Sunday Pain score: 8  Vitals:   01/05/24 2209  BP: 113/75  Pulse: 92  Resp: 18  Temp: 97.7 F (36.5 C)  SpO2: 99%     FHT: 160 by U/S   Lab orders placed from triage: covid,flu rSV

## 2024-01-05 NOTE — Discharge Instructions (Signed)
 It was a pleasure taking care of you today.  Your symptoms are consistent with a viral upper respiratory infection.  Below is a list of medications that are safe to take during pregnancy.  If her symptoms worsen or you have any other concerns please return for further evaluation.  Hope you have a great rest of your night!  Safe Medications in Pregnancy   Acne:  Benzoyl Peroxide  Salicylic Acid   Backache/Headache:  Tylenol : 2 regular strength every 4 hours OR               2 Extra strength every 6 hours   Colds/Coughs/Allergies:  Benadryl  (alcohol free) 25 mg every 6 hours as needed  Breath right strips  Claritin  Cepacol throat lozenges  Chloraseptic throat spray  Cold-Eeze- up to three times per day  Cough drops, alcohol free  Flonase  (by prescription only)  Guaifenesin   Mucinex   Robitussin DM (plain only, alcohol free)  Saline nasal spray/drops  Sudafed (pseudoephedrine ) & Actifed * use only after [redacted] weeks gestation and if you do not have high blood pressure  Tylenol   Vicks Vaporub  Zinc lozenges  Zyrtec    Constipation:  Colace  Ducolax suppositories  Fleet enema  Glycerin  suppositories  Metamucil  Milk of magnesia  Miralax   Senokot  Smooth move tea   Diarrhea:  Kaopectate  Imodium A-D   *NO pepto Bismol   Hemorrhoids:  Anusol   Anusol  HC  Preparation H  Tucks   Indigestion:  Tums  Maalox  Mylanta  Zantac  Pepcid    Insomnia:  Benadryl  (alcohol free) 25mg  every 6 hours as needed  Tylenol  PM  Unisom, no Gelcaps   Leg Cramps:  Tums  MagGel   Nausea/Vomiting:  Bonine  Dramamine  Emetrol  Ginger extract  Sea bands  Meclizine   Nausea medication to take during pregnancy:  Unisom (doxylamine succinate 25 mg tablets) Take one tablet daily at bedtime. If symptoms are not adequately controlled, the dose can be increased to a maximum recommended dose of two tablets daily (1/2 tablet in the morning, 1/2 tablet mid-afternoon and one at bedtime).   Vitamin B6 100mg  tablets. Take one tablet twice a day (up to 200 mg per day).   Skin Rashes:  Aveeno products  Benadryl  cream or 25mg  every 6 hours as needed  Calamine Lotion  1% cortisone cream   Yeast infection:  Gyne-lotrimin 7  Monistat 7    **If taking multiple medications, please check labels to avoid duplicating the same active ingredients  **take medication as directed on the label  ** Do not exceed 4000 mg of tylenol  in 24 hours  **Do not take medications that contain aspirin or ibuprofen 

## 2024-01-05 NOTE — MAU Provider Note (Signed)
 History     CSN: [id number]  Arrival date and time: 01/05/24 2152   None     No chief complaint on file.  HPI Patient is a 33 year old G5 P3-0-1-3 at 11 weeks 1 day presenting for viral URI symptoms.  Reports that approximately a week ago she started noticing sore throat and congestion.  Reports that those symptoms stayed steady for about a week but yesterday she noticed worsening pressure in her sinuses and headache.  Has not taken any medications.  Denies any leaking or contractions.  Does report she had 1 episode of spotting a few days ago.  OB History     Gravida  5   Para  3   Term  3   Preterm  0   AB  1   Living  3      SAB  1   IAB  0   Ectopic  0   Multiple  0   Live Births  3           Past Medical History:  Diagnosis Date   Abnormal Pap smear    Allergy    Anxiety    Asthma    Headache(784.0)    PONV (postoperative nausea and vomiting)     Past Surgical History:  Procedure Laterality Date   CHOLECYSTECTOMY N/A 10/09/2012   Procedure: LAPAROSCOPIC CHOLECYSTECTOMY WITH INTRAOPERATIVE CHOLANGIOGRAM;  Surgeon: Alm VEAR Angle, MD;  Location: [facility] OR;  Service: General;  Laterality: N/A;   COLPOSCOPY     TONSILLECTOMY     WISDOM TOOTH EXTRACTION      Family History  Problem Relation Age of Onset   Heart disease Maternal Grandfather    Diabetes Maternal Grandfather    Anxiety disorder Maternal Grandfather    Hypertension Mother    Heart disease Mother        PVCs   Anemia Mother    Thyroid disease Mother    Migraines Mother    Lupus Mother    Rheum arthritis Mother    Fibromyalgia Mother    Hypertension Father    Heart disease Father    Diabetes Maternal Aunt    Kidney disease Maternal Aunt    Thyroid disease Maternal Grandmother    Cancer Paternal Grandmother        lymphoma   Cancer Paternal Grandfather        prostate   Healthy Brother    Asthma Daughter    Healthy Daughter    Healthy Daughter    Healthy Daughter     Anesthesia problems Neg Hx    Hypotension Neg Hx    Malignant hyperthermia Neg Hx    Pseudochol deficiency Neg Hx     Social History   Tobacco Use   Smoking status: Never    Passive exposure: Current (vape)   Smokeless tobacco: Never  Vaping Use   Vaping status: Never Used  Substance Use Topics   Alcohol use: Yes    Comment: rarely   Drug use: No    Allergies: No Known Allergies  Medications Prior to Admission  Medication Sig Dispense Refill Last Dose/Taking   acetaminophen  (TYLENOL ) 325 MG tablet Take 2 tablets (650 mg total) by mouth every 6 (six) hours as needed. 36 tablet 0    albuterol  (VENTOLIN  HFA) 108 (90 Base) MCG/ACT inhaler Inhale 2 puffs into the lungs every 4 (four) hours as needed for wheezing or shortness of breath. 18 g 2    azelastine  (ASTELIN ) 0.1 % nasal spray  Place 2 sprays into both nostrils 2 (two) times daily for 7 days. Use in each nostril as directed (Patient not taking: Reported on 02/19/2023) 30 mL 0    cetirizine  (ZYRTEC  ALLERGY) 10 MG tablet Take 1 tablet (10 mg total) by mouth daily for 14 days. (Patient not taking: Reported on 02/19/2023) 14 tablet 0    doxycycline  (VIBRAMYCIN ) 100 MG capsule Take 1 capsule (100 mg total) by mouth 2 (two) times daily. 14 capsule 0    fluconazole  (DIFLUCAN ) 150 MG tablet Take 1 tablet (150 mg total) by mouth daily. Take second dose 72 hours later if symptoms still persists. 2 tablet 0    fluticasone  (FLONASE ) 50 MCG/ACT nasal spray Place 1 spray into both nostrils daily. (Patient not taking: Reported on 02/19/2023) 15.8 mL 2    ibuprofen  (ADVIL ) 600 MG tablet Take 1 tablet (600 mg total) by mouth every 6 (six) hours as needed. 30 tablet 0    lidocaine  (LIDODERM ) 5 % Place 1 patch onto the skin daily as needed. Remove & Discard patch within 12 hours or as directed by MD (Patient not taking: Reported on 02/19/2023) 15 patch 0    meclizine  (ANTIVERT ) 25 MG tablet Take 1 tablet (25 mg total) by mouth 3 (three) times daily as  needed for dizziness. (Patient not taking: Reported on 02/19/2023) 30 tablet 0    norethindrone (MICRONOR) 0.35 MG tablet Take 0.35 mg by mouth daily. (Patient not taking: Reported on 02/19/2023)      oxymetazoline (AFRIN NASAL SPRAY) 0.05 % nasal spray Place 1 spray into both nostrils 2 (two) times daily. Use for 3 days only (Patient not taking: Reported on 02/19/2023) 30 mL 0    predniSONE  (DELTASONE ) 50 MG tablet Take 1 tablet (50 mg total) by mouth daily with breakfast. 5 tablet 0    promethazine  (PHENERGAN ) 25 MG tablet Take 1 tablet (25 mg total) by mouth every 6 (six) hours as needed (headache). (Patient not taking: Reported on 02/19/2023) 20 tablet 0     Review of Systems  Constitutional:  Negative for fever.  HENT:  Positive for congestion, postnasal drip, rhinorrhea and sore throat.   Respiratory:  Positive for cough.   Genitourinary:  Positive for vaginal bleeding. Negative for vaginal discharge.  All other systems reviewed and are negative.  Physical Exam   Blood pressure 113/75, pulse 92, temperature 97.7 F (36.5 C), resp. rate 18, height 5' 7 (1.702 m), weight 92.1 kg, SpO2 99%, unknown if currently breastfeeding.  Physical Exam Vitals and nursing note reviewed.  Constitutional:      Appearance: Normal appearance.  HENT:     Head: Normocephalic and atraumatic.     Nose: Congestion and rhinorrhea present.  Eyes:     Extraocular Movements: Extraocular movements intact.  Cardiovascular:     Rate and Rhythm: Normal rate.  Pulmonary:     Effort: Pulmonary effort is normal. No respiratory distress.     Breath sounds: No wheezing or rhonchi.  Abdominal:     Palpations: Abdomen is soft.     Tenderness: There is no abdominal tenderness.  Musculoskeletal:        General: Normal range of motion.     Cervical back: Normal range of motion.  Skin:    General: Skin is warm.     Capillary Refill: Capillary refill takes less than 2 seconds.  Neurological:     General: No  focal deficit present.     Mental Status: She is alert.  Cranial Nerves: No cranial nerve deficit.  Psychiatric:        Mood and Affect: Mood normal.        Behavior: Behavior normal.   Bedside ultrasound on fetal heart rate 160 bpm  MAU Course  Procedures  MDM Physical exam  Assessment and Plan  HELI DINO is a 33 year old G5, P3 at 11 weeks 1 day presenting for viral URI symptoms  Viral URI Signs and symptoms consistent with viral upper respiratory infection.  Has not taken any OTC medications.  Was unsure of what she could take because of her pregnancy.  Provided patient with a list of medications that are safe to take during pregnancy.  Discussed strict return precautions.  Collected COVID/flu/RSV swab as well as UA.  Patient request the results be sent to her MyChart.  No further questions or concerns.  Patient discharged home.  Jerry Clyne V Madai Nuccio 01/05/2024, 10:34 PM

## 2024-01-11 ENCOUNTER — Inpatient Hospital Stay (HOSPITAL_COMMUNITY)

## 2024-01-11 ENCOUNTER — Encounter (HOSPITAL_COMMUNITY): Payer: Self-pay | Admitting: Obstetrics and Gynecology

## 2024-01-11 ENCOUNTER — Other Ambulatory Visit: Payer: Self-pay

## 2024-01-11 ENCOUNTER — Inpatient Hospital Stay (HOSPITAL_COMMUNITY)
Admission: AD | Admit: 2024-01-11 | Discharge: 2024-01-11 | Disposition: A | Discharge status: Home / Self Care | Payer: Self-pay | Attending: Obstetrics and Gynecology | Admitting: Obstetrics and Gynecology

## 2024-01-11 DIAGNOSIS — O99511 Diseases of the respiratory system complicating pregnancy, first trimester: Secondary | ICD-10-CM | POA: Diagnosis present

## 2024-01-11 DIAGNOSIS — O09521 Supervision of elderly multigravida, first trimester: Secondary | ICD-10-CM | POA: Diagnosis not present

## 2024-01-11 DIAGNOSIS — Z3A12 12 weeks gestation of pregnancy: Secondary | ICD-10-CM

## 2024-01-11 DIAGNOSIS — J45909 Unspecified asthma, uncomplicated: Secondary | ICD-10-CM | POA: Diagnosis not present

## 2024-01-11 LAB — COMPREHENSIVE METABOLIC PANEL WITH GFR
ALT: 25 U/L (ref 0–44)
AST: 20 U/L (ref 15–41)
Albumin: 3.2 g/dL — ABNORMAL LOW (ref 3.5–5.0)
Alkaline Phosphatase: 111 U/L (ref 38–126)
Anion gap: 15 (ref 5–15)
BUN: 7 mg/dL (ref 6–20)
CO2: 23 mmol/L (ref 22–32)
Calcium: 8.7 mg/dL — ABNORMAL LOW (ref 8.9–10.3)
Chloride: 101 mmol/L (ref 98–111)
Creatinine, Ser: 0.47 mg/dL (ref 0.44–1.00)
GFR, Estimated: >60 mL/min (ref >60)
Glucose, Bld: 97 mg/dL (ref 70–99)
Potassium: 3.5 mmol/L (ref 3.5–5.1)
Sodium: 139 mmol/L (ref 135–145)
Total Bilirubin: 0.4 mg/dL (ref 0.0–1.2)
Total Protein: 7 g/dL (ref 6.5–8.1)

## 2024-01-11 LAB — CBC
HCT: 34 % — ABNORMAL LOW (ref 36.0–46.0)
Hemoglobin: 11.9 g/dL — ABNORMAL LOW (ref 12.0–15.0)
MCH: 30.4 pg (ref 26.0–34.0)
MCHC: 35 g/dL (ref 30.0–36.0)
MCV: 87 fL (ref 80.0–100.0)
Platelets: 264 K/uL (ref 150–400)
RBC: 3.91 MIL/uL (ref 3.87–5.11)
RDW: 12.1 % (ref 11.5–15.5)
WBC: 10.7 K/uL — ABNORMAL HIGH (ref 4.0–10.5)
nRBC: 0 % (ref 0.0–0.2)

## 2024-01-11 LAB — URINALYSIS, ROUTINE W REFLEX MICROSCOPIC
Bacteria, UA: NONE SEEN
Bilirubin Urine: NEGATIVE
Glucose, UA: NEGATIVE mg/dL
Hgb urine dipstick: NEGATIVE
Ketones, ur: NEGATIVE mg/dL
Nitrite: NEGATIVE
Protein, ur: NEGATIVE mg/dL
Specific Gravity, Urine: 1.005 (ref 1.005–1.030)
pH: 7 (ref 5.0–8.0)

## 2024-01-11 LAB — TROPONIN I (HIGH SENSITIVITY): Troponin I (High Sensitivity): 3 ng/L (ref <18)

## 2024-01-11 MED ORDER — ALBUTEROL SULFATE (2.5 MG/3ML) 0.083% IN NEBU
2.5000 mg | INHALATION_SOLUTION | RESPIRATORY_TRACT | Status: DC | PRN
  Administered 2024-01-11: 2.5 mg via RESPIRATORY_TRACT
  Filled 2024-01-11: qty 3

## 2024-01-11 MED ORDER — DEXTROMETHORPHAN POLISTIREX ER 30 MG/5ML PO SUER
30.0000 mg | Freq: Once | ORAL | Status: AC
  Administered 2024-01-11: 30 mg via ORAL
  Filled 2024-01-11: qty 5

## 2024-01-11 MED ORDER — BENZONATATE 100 MG PO CAPS: 200.0000 mg | ORAL_CAPSULE | Freq: Once | ORAL | Status: DC

## 2024-01-11 NOTE — Discharge Instructions (Signed)

## 2024-01-11 NOTE — MAU Provider Note (Signed)
 History     CSN: [id number]  Arrival date and time: 01/11/24 9076   Event Date/Time   First Provider Initiated Contact with Patient 01/11/24 [phone]      Chief Complaint  Patient presents with   Shortness of Breath   HPI Kristen Davidson is a 33 y.o. [id number] at [redacted]w[redacted]d who presents with SOB.  Has had cough for the last several weeks.  Was seen at urgent care last week and prescribed amoxicillin  which she started taking last night.  Has history of asthma and uses as needed inhaler.  Used albuterol  this morning.  Notes worsening cough leads to feelings of shortness of breath and chest pressure with tightness.  Denies chest pain, fever, abdominal pain, vaginal bleeding.  OB History     Gravida  5   Para  3   Term  3   Preterm  0   AB  1   Living  3      SAB  1   IAB  0   Ectopic  0   Multiple  0   Live Births  3           Past Medical History:  Diagnosis Date   Abnormal Pap smear    Allergy    Anxiety    Asthma    Headache(784.0)    PONV (postoperative nausea and vomiting)     Past Surgical History:  Procedure Laterality Date   CHOLECYSTECTOMY N/A 10/09/2012   Procedure: LAPAROSCOPIC CHOLECYSTECTOMY WITH INTRAOPERATIVE CHOLANGIOGRAM;  Surgeon: Alm VEAR Angle, MD;  Location: [facility] OR;  Service: General;  Laterality: N/A;   COLPOSCOPY     TONSILLECTOMY     WISDOM TOOTH EXTRACTION      Family History  Problem Relation Age of Onset   Heart disease Maternal Grandfather    Diabetes Maternal Grandfather    Anxiety disorder Maternal Grandfather    Hypertension Mother    Heart disease Mother        PVCs   Anemia Mother    Thyroid disease Mother    Migraines Mother    Lupus Mother    Rheum arthritis Mother    Fibromyalgia Mother    Hypertension Father    Heart disease Father    Diabetes Maternal Aunt    Kidney disease Maternal Aunt    Thyroid disease Maternal Grandmother    Cancer Paternal Grandmother        lymphoma   Cancer Paternal Grandfather         prostate   Healthy Brother    Asthma Daughter    Healthy Daughter    Healthy Daughter    Healthy Daughter    Anesthesia problems Neg Hx    Hypotension Neg Hx    Malignant hyperthermia Neg Hx    Pseudochol deficiency Neg Hx     Social History   Tobacco Use   Smoking status: Never    Passive exposure: Current (vape)   Smokeless tobacco: Never  Vaping Use   Vaping status: Never Used  Substance Use Topics   Alcohol use: Yes    Comment: rarely   Drug use: No    Allergies: No Known Allergies  Medications Prior to Admission  Medication Sig Dispense Refill Last Dose/Taking   acetaminophen  (TYLENOL ) 325 MG tablet Take 2 tablets (650 mg total) by mouth every 6 (six) hours as needed. 36 tablet 0 Past Month   albuterol  (VENTOLIN  HFA) 108 (90 Base) MCG/ACT inhaler Inhale 2 puffs into the lungs every  4 (four) hours as needed for wheezing or shortness of breath. 18 g 2 01/11/2024 Morning   azelastine  (ASTELIN ) 0.1 % nasal spray Place 2 sprays into both nostrils 2 (two) times daily for 7 days. Use in each nostril as directed (Patient not taking: Reported on 02/19/2023) 30 mL 0    cetirizine  (ZYRTEC  ALLERGY) 10 MG tablet Take 1 tablet (10 mg total) by mouth daily for 14 days. (Patient not taking: Reported on 02/19/2023) 14 tablet 0    fluticasone  (FLONASE ) 50 MCG/ACT nasal spray Place 1 spray into both nostrils daily. (Patient not taking: Reported on 02/19/2023) 15.8 mL 2    ibuprofen  (ADVIL ) 600 MG tablet Take 1 tablet (600 mg total) by mouth every 6 (six) hours as needed. 30 tablet 0    lidocaine  (LIDODERM ) 5 % Place 1 patch onto the skin daily as needed. Remove & Discard patch within 12 hours or as directed by MD (Patient not taking: Reported on 02/19/2023) 15 patch 0    meclizine  (ANTIVERT ) 25 MG tablet Take 1 tablet (25 mg total) by mouth 3 (three) times daily as needed for dizziness. (Patient not taking: Reported on 02/19/2023) 30 tablet 0    norethindrone (MICRONOR) 0.35 MG tablet  Take 0.35 mg by mouth daily. (Patient not taking: Reported on 02/19/2023)      oxymetazoline (AFRIN NASAL SPRAY) 0.05 % nasal spray Place 1 spray into both nostrils 2 (two) times daily. Use for 3 days only (Patient not taking: Reported on 02/19/2023) 30 mL 0    promethazine  (PHENERGAN ) 25 MG tablet Take 1 tablet (25 mg total) by mouth every 6 (six) hours as needed (headache). (Patient not taking: Reported on 02/19/2023) 20 tablet 0     Review of Systems  All other systems reviewed and are negative.  Physical Exam   Blood pressure 119/65, pulse 82, temperature 98.1 F (36.7 C), resp. rate (!) 24, SpO2 97%, unknown if currently breastfeeding.  Physical Exam Vitals and nursing note reviewed.  Constitutional:      General: She is not in acute distress.    Appearance: She is well-developed. She is not ill-appearing.  HENT:     Head: Normocephalic and atraumatic.  Eyes:     General: No scleral icterus.       Right eye: No discharge.        Left eye: No discharge.     Conjunctiva/sclera: Conjunctivae normal.  Pulmonary:     Effort: Pulmonary effort is normal. Tachypnea present. No respiratory distress.     Breath sounds: Normal breath sounds. No wheezing or rhonchi.  Neurological:     General: No focal deficit present.     Mental Status: She is alert.  Psychiatric:        Mood and Affect: Mood normal.        Behavior: Behavior normal.    Pt informed that the ultrasound is considered a limited OB ultrasound and is not intended to be a complete ultrasound exam.  Patient also informed that the ultrasound is not being completed with the intent of assessing for fetal or placental anomalies or any pelvic abnormalities.  Explained that the purpose of today's ultrasound is to assess for  viability.  Patient acknowledges the purpose of the exam and the limitations of the study.   +FCA, FHR 145 bpm  MAU Course  Procedures Results for orders placed or performed during the hospital encounter of  01/11/24 (from the past 24 hours)  CBC     Status: Abnormal  Collection Time: 01/11/24  9:56 AM  Result Value Ref Range   WBC 10.7 (H) 4.0 - 10.5 K/uL   RBC 3.91 3.87 - 5.11 MIL/uL   Hemoglobin 11.9 (L) 12.0 - 15.0 g/dL   HCT 65.9 (L) 63.9 - 53.9 %   MCV 87.0 80.0 - 100.0 fL   MCH 30.4 26.0 - 34.0 pg   MCHC 35.0 30.0 - 36.0 g/dL   RDW 87.8 88.4 - 84.4 %   Platelets 264 150 - 400 K/uL   nRBC 0.0 0.0 - 0.2 %  Comprehensive metabolic panel with GFR     Status: Abnormal   Collection Time: 01/11/24  9:56 AM  Result Value Ref Range   Sodium 139 135 - 145 mmol/L   Potassium 3.5 3.5 - 5.1 mmol/L   Chloride 101 98 - 111 mmol/L   CO2 23 22 - 32 mmol/L   Glucose, Bld 97 70 - 99 mg/dL   BUN 7 6 - 20 mg/dL   Creatinine, Ser 9.52 0.44 - 1.00 mg/dL   Calcium  8.7 (L) 8.9 - 10.3 mg/dL   Total Protein 7.0 6.5 - 8.1 g/dL   Albumin 3.2 (L) 3.5 - 5.0 g/dL   AST 20 15 - 41 U/L   ALT 25 0 - 44 U/L   Alkaline Phosphatase 111 38 - 126 U/L   Total Bilirubin 0.4 0.0 - 1.2 mg/dL   GFR, Estimated >39 >39 mL/min   Anion gap 15 5 - 15  Troponin I (High Sensitivity)     Status: None   Collection Time: 01/11/24  9:56 AM  Result Value Ref Range   Troponin I (High Sensitivity) 3 <18 ng/L  Urinalysis, Routine w reflex microscopic -Urine, Clean Catch     Status: Abnormal   Collection Time: 01/11/24 10:10 AM  Result Value Ref Range   Color, Urine STRAW (A) YELLOW   APPearance CLEAR CLEAR   Specific Gravity, Urine 1.005 1.005 - 1.030   pH 7.0 5.0 - 8.0   Glucose, UA NEGATIVE NEGATIVE mg/dL   Hgb urine dipstick NEGATIVE NEGATIVE   Bilirubin Urine NEGATIVE NEGATIVE   Ketones, ur NEGATIVE NEGATIVE mg/dL   Protein, ur NEGATIVE NEGATIVE mg/dL   Nitrite NEGATIVE NEGATIVE   Leukocytes,Ua SMALL (A) NEGATIVE   RBC / HPF 0-5 0 - 5 RBC/hpf   WBC, UA 0-5 0 - 5 WBC/hpf   Bacteria, UA NONE SEEN NONE SEEN   Squamous Epithelial / HPF 0-5 0 - 5 /HPF   DG Chest Portable 1 View Result Date: 01/11/2024 CLINICAL  DATA:  Shortness of breath chest EXAM: PORTABLE CHEST 1 VIEW COMPARISON:  Radiograph dated 12/06/2020 FINDINGS: Normal lung volumes. No focal consolidations. No pleural effusion or pneumothorax. The heart size and mediastinal contours are within normal limits. No acute osseous abnormality. IMPRESSION: No focal consolidations. Electronically Signed   By: Limin  Xu M.D.   On: 01/11/2024 10:16    MDM   Assessment and Plan   1. Asthma affecting pregnancy in first trimester   2. [redacted] weeks gestation of pregnancy    -BSUS performed for heart tones - FHR 145 - Labs, EKG, chest x-ray ordered due to complaint of shortness of breath and chest pressure.  Symptoms consistent with asthma.  Chest x-ray EKG and troponin all normal.  Labs unremarkable. -Symptoms improved with rest and albuterol  nebulizer.  Also given dextromethorphan while in the MAU. - Discussed OTC meds to use for cough.  Encouraged to continue taking albuterol  inhaler prn and recommend she follow-up  with PCP for asthma management - Reviewed reasons to return to hospital.  Rocky Satterfield 01/11/2024, 9:48 AM

## 2024-01-11 NOTE — MAU Note (Signed)
.  Kristen Davidson is a 33 y.o. at [redacted]w[redacted]d here in MAU reporting: Patient reports she has an inhaler for asthma and she was out so had to use her husbands. Patient reports feeling like something is sitting on her chest. She reports cracking in chest. Patient reports trouble breathing at 0200 and has continued.  Onset of complaint: last night  Pain score: 8/10 chest pressure  There were no vitals filed for this visit.    Lab orders placed from triage:   ua

## 2024-04-03 ENCOUNTER — Encounter (HOSPITAL_COMMUNITY): Payer: Self-pay | Admitting: Obstetrics & Gynecology

## 2024-04-03 ENCOUNTER — Inpatient Hospital Stay (HOSPITAL_COMMUNITY)
Admission: AD | Admit: 2024-04-03 | Discharge: 2024-04-03 | Disposition: A | Discharge status: Home / Self Care | Source: Ambulatory Visit | Attending: Family Medicine | Admitting: Family Medicine

## 2024-04-03 DIAGNOSIS — B379 Candidiasis, unspecified: Secondary | ICD-10-CM | POA: Diagnosis not present

## 2024-04-03 DIAGNOSIS — J4540 Moderate persistent asthma, uncomplicated: Secondary | ICD-10-CM | POA: Insufficient documentation

## 2024-04-03 DIAGNOSIS — O99512 Diseases of the respiratory system complicating pregnancy, second trimester: Secondary | ICD-10-CM | POA: Diagnosis not present

## 2024-04-03 DIAGNOSIS — O98812 Other maternal infectious and parasitic diseases complicating pregnancy, second trimester: Secondary | ICD-10-CM | POA: Insufficient documentation

## 2024-04-03 DIAGNOSIS — O26892 Other specified pregnancy related conditions, second trimester: Secondary | ICD-10-CM

## 2024-04-03 DIAGNOSIS — B3731 Acute candidiasis of vulva and vagina: Secondary | ICD-10-CM | POA: Diagnosis not present

## 2024-04-03 DIAGNOSIS — M7918 Myalgia, other site: Secondary | ICD-10-CM

## 2024-04-03 DIAGNOSIS — Z3A23 23 weeks gestation of pregnancy: Secondary | ICD-10-CM | POA: Diagnosis not present

## 2024-04-03 DIAGNOSIS — R059 Cough, unspecified: Secondary | ICD-10-CM

## 2024-04-03 DIAGNOSIS — R1022 Pelvic and perineal pain left side: Secondary | ICD-10-CM | POA: Diagnosis present

## 2024-04-03 DIAGNOSIS — N9489 Other specified conditions associated with female genital organs and menstrual cycle: Secondary | ICD-10-CM

## 2024-04-03 DIAGNOSIS — J4530 Mild persistent asthma, uncomplicated: Secondary | ICD-10-CM

## 2024-04-03 LAB — URINALYSIS, ROUTINE W REFLEX MICROSCOPIC
Bilirubin Urine: NEGATIVE
Glucose, UA: NEGATIVE mg/dL
Hgb urine dipstick: NEGATIVE
Ketones, ur: NEGATIVE mg/dL
Nitrite: NEGATIVE
Protein, ur: NEGATIVE mg/dL
Specific Gravity, Urine: 1.013 (ref 1.005–1.030)
pH: 6 (ref 5.0–8.0)

## 2024-04-03 LAB — WET PREP, GENITAL
Clue Cells Wet Prep HPF POC: NONE SEEN
Sperm: NONE SEEN
Trich, Wet Prep: NONE SEEN
WBC, Wet Prep HPF POC: >=10 — AB

## 2024-04-03 MED ORDER — MICONAZOLE NITRATE 2 % VA CREA: 1.0000 | TOPICAL_CREAM | Freq: Every day | VAGINAL | 2 refills | Status: AC

## 2024-04-03 MED ORDER — ALBUTEROL SULFATE HFA 108 (90 BASE) MCG/ACT IN AERS: 2.0000 | INHALATION_SPRAY | RESPIRATORY_TRACT | 3 refills | Status: AC | PRN

## 2024-04-03 MED ORDER — MONTELUKAST SODIUM 5 MG PO CHEW: 5.0000 mg | CHEWABLE_TABLET | Freq: Every day | ORAL | 8 refills | Status: AC

## 2024-04-03 NOTE — Discharge Instructions (Addendum)
 Please go to the ED to register tomorrow morning for your vascular study.   They will perform ultrasound to determine if you have a blood clot in your leg.

## 2024-04-03 NOTE — MAU Note (Signed)
..  Kristen Davidson is a 34 y.o. at [redacted]w[redacted]d here in MAU reporting: vaginal pain on her left labia, hurts when she moves her leg, is sharp but does not feel like 'lightening crotch'. Has been going on for 4 weeks. Called OB office today and was advised to come in to make sure it wasn't a blood clot.  Denies abdominal pain, vaginal bleeding, or leaking of fluid. +FM Pain score: 8/10 Vitals:   04/03/24 2032  BP: (!) 130/56  Pulse: 100  Resp: 16  Temp: 97.9 F (36.6 C)  SpO2: 100%     FHT:140 Lab orders placed from triage: UA

## 2024-04-03 NOTE — MAU Provider Note (Signed)
"   None     S Ms. Kristen Davidson is a 34 y.o. [phone] pregnant female at [redacted]w[redacted]d who presents to MAU today with complaint of left labial pain, worse with movement of leg. Reports pain is sharp. This started approximately 4 weeks ago. She reports since then, it has gradually gotten progressively worse. Her husband has been helping her get pants on her left leg due to difficulty with movement. She reports that she doesn't feel any palpable lumps and it is not painful or hot to the touch. No pain with urination. Reports she called the OB triage line and was told to present here in case of blood clot. Denies abdominal pain, VB or LOF. Endorses regular and vigorous fetal movement.   Denies VB, abdominal pain or leaking of fluid.   Receives care at [facility]. Prenatal records reviewed.  Pertinent items noted in HPI and remainder of comprehensive ROS otherwise negative.   O BP (!) 130/56 (BP Location: Right Arm)   Pulse 100   Temp 97.9 F (36.6 C) (Oral)   Resp 16   Ht 5' 1 (1.549 m)   Wt 93 kg   LMP  (LMP Unknown)   SpO2 100%   BMI 38.73 kg/m  Physical Exam Vitals reviewed.  Constitutional:      Appearance: Normal appearance.  HENT:     Head: Normocephalic.  Cardiovascular:     Rate and Rhythm: Normal rate and regular rhythm.     Pulses: Normal pulses.     Heart sounds: Normal heart sounds.  Pulmonary:     Effort: Pulmonary effort is normal.     Breath sounds: Normal breath sounds.  Skin:    General: Skin is warm and dry.     Capillary Refill: Capillary refill takes less than 2 seconds.  Neurological:     General: No focal deficit present.     Mental Status: She is alert and oriented to person, place, and time.      MDM:  Moderate  MAU Course:  A No diagnosis found.  Medical screening exam complete  P Discharge from MAU in stable condition with strict return precautions Follow up at *** as scheduled for ongoing prenatal care  Allergies as of 04/03/2024   No Known  Allergies   Med Rec must be completed prior to using this [facility]***       Camie Rote, MSN, CNM 04/03/2024 8:38 PM  Certified Nurse Midwife, [facility]  "

## 2024-04-05 ENCOUNTER — Other Ambulatory Visit (HOSPITAL_COMMUNITY): Payer: Self-pay | Admitting: Obstetrics and Gynecology

## 2024-04-05 ENCOUNTER — Ambulatory Visit (HOSPITAL_COMMUNITY)
Admission: RE | Admit: 2024-04-05 | Discharge: 2024-04-05 | Disposition: A | Discharge status: Home / Self Care | Source: Ambulatory Visit | Attending: Surgery | Admitting: Surgery

## 2024-04-05 DIAGNOSIS — M79605 Pain in left leg: Secondary | ICD-10-CM | POA: Diagnosis not present
